# Patient Record
Sex: Female | Born: 1935 | Hispanic: No | Marital: Married | Smoking: Never smoker
Health system: Southern US, Community
[De-identification: ages and names within clinical notes are randomized; demographics above are authoritative.]

## PROBLEM LIST (undated history)

## (undated) DIAGNOSIS — E785 Hyperlipidemia, unspecified: Secondary | ICD-10-CM

## (undated) DIAGNOSIS — I4891 Unspecified atrial fibrillation: Secondary | ICD-10-CM

## (undated) DIAGNOSIS — K922 Gastrointestinal hemorrhage, unspecified: Secondary | ICD-10-CM

## (undated) DIAGNOSIS — I10 Essential (primary) hypertension: Secondary | ICD-10-CM

## (undated) DIAGNOSIS — I2510 Atherosclerotic heart disease of native coronary artery without angina pectoris: Secondary | ICD-10-CM

## (undated) DIAGNOSIS — Z9889 Other specified postprocedural states: Secondary | ICD-10-CM

## (undated) DIAGNOSIS — I509 Heart failure, unspecified: Secondary | ICD-10-CM

## (undated) HISTORY — DX: Heart failure, unspecified: I50.9

## (undated) HISTORY — DX: Atherosclerotic heart disease of native coronary artery without angina pectoris: I25.10

## (undated) HISTORY — DX: Unspecified atrial fibrillation: I48.91

## (undated) HISTORY — PX: CARDIAC CATHETERIZATION: SHX172

## (undated) HISTORY — DX: Essential (primary) hypertension: I10

## (undated) HISTORY — DX: Hyperlipidemia, unspecified: E78.5

## (undated) HISTORY — PX: KNEE ARTHROSCOPY: SUR90

---

## 2002-01-04 ENCOUNTER — Ambulatory Visit (HOSPITAL_COMMUNITY): Admission: RE | Admit: 2002-01-04 | Discharge: 2002-01-04 | Payer: Self-pay | Admitting: Gastroenterology

## 2005-02-15 ENCOUNTER — Ambulatory Visit: Payer: Self-pay | Admitting: Internal Medicine

## 2005-02-22 ENCOUNTER — Ambulatory Visit (HOSPITAL_COMMUNITY): Admission: RE | Admit: 2005-02-22 | Discharge: 2005-02-22 | Payer: Self-pay | Admitting: Cardiology

## 2005-03-04 ENCOUNTER — Ambulatory Visit (HOSPITAL_COMMUNITY): Admission: RE | Admit: 2005-03-04 | Discharge: 2005-03-04 | Payer: Self-pay | Admitting: Gastroenterology

## 2005-03-04 ENCOUNTER — Encounter (INDEPENDENT_AMBULATORY_CARE_PROVIDER_SITE_OTHER): Payer: Self-pay | Admitting: Specialist

## 2007-03-05 ENCOUNTER — Ambulatory Visit: Payer: Self-pay | Admitting: Ophthalmology

## 2007-03-15 ENCOUNTER — Ambulatory Visit: Payer: Self-pay | Admitting: Internal Medicine

## 2009-08-27 ENCOUNTER — Ambulatory Visit: Payer: Self-pay | Admitting: Internal Medicine

## 2009-09-21 ENCOUNTER — Ambulatory Visit: Payer: Self-pay | Admitting: Rheumatology

## 2009-09-29 ENCOUNTER — Ambulatory Visit: Payer: Self-pay | Admitting: Unknown Physician Specialty

## 2009-10-01 ENCOUNTER — Ambulatory Visit: Payer: Self-pay | Admitting: Unknown Physician Specialty

## 2009-10-06 ENCOUNTER — Ambulatory Visit: Payer: Self-pay | Admitting: Cardiology

## 2010-06-23 ENCOUNTER — Encounter: Payer: Self-pay | Admitting: Internal Medicine

## 2010-06-29 ENCOUNTER — Ambulatory Visit: Payer: Self-pay | Admitting: Oncology

## 2010-06-29 ENCOUNTER — Encounter: Payer: Self-pay | Admitting: Internal Medicine

## 2010-06-29 ENCOUNTER — Ambulatory Visit
Admission: RE | Admit: 2010-06-29 | Discharge: 2010-06-29 | Payer: Self-pay | Source: Home / Self Care | Attending: Internal Medicine | Admitting: Internal Medicine

## 2010-06-29 DIAGNOSIS — R079 Chest pain, unspecified: Secondary | ICD-10-CM | POA: Insufficient documentation

## 2010-06-29 LAB — PROTIME-INR
INR: 1.6 — ABNORMAL LOW (ref 2.00–3.50)
Protime: 19.2 Seconds — ABNORMAL HIGH (ref 10.6–13.4)

## 2010-06-30 ENCOUNTER — Telehealth: Payer: Self-pay | Admitting: Internal Medicine

## 2010-06-30 ENCOUNTER — Encounter
Admission: RE | Admit: 2010-06-30 | Discharge: 2010-06-30 | Payer: Self-pay | Source: Home / Self Care | Attending: Internal Medicine | Admitting: Internal Medicine

## 2010-06-30 LAB — CONVERTED CEMR LAB
Basophils Absolute: 0.1 10*3/uL (ref 0.0–0.1)
Basophils Relative: 1 % (ref 0–1)
Chloride: 102 meq/L (ref 96–112)
Lymphocytes Relative: 30 % (ref 12–46)
MCHC: 31.9 g/dL (ref 30.0–36.0)
Monocytes Relative: 9 % (ref 3–12)
Neutro Abs: 3.9 10*3/uL (ref 1.7–7.7)
Neutrophils Relative %: 58 % (ref 43–77)
Potassium: 4.3 meq/L (ref 3.5–5.3)
RBC: 4.23 M/uL (ref 3.87–5.11)
RDW: 15.5 % (ref 11.5–15.5)
Sodium: 136 meq/L (ref 135–145)

## 2010-07-01 ENCOUNTER — Telehealth: Payer: Self-pay | Admitting: Internal Medicine

## 2010-07-02 ENCOUNTER — Ambulatory Visit
Admission: RE | Admit: 2010-07-02 | Discharge: 2010-07-02 | Payer: Self-pay | Source: Home / Self Care | Attending: Internal Medicine | Admitting: Internal Medicine

## 2010-07-05 LAB — GLUCOSE, POCT (MANUAL RESULT ENTRY)
Glucose, Bld: 110 mg/dL — ABNORMAL HIGH (ref 70–99)
Operator id: 178271

## 2010-07-05 LAB — POCT I-STAT 3, VENOUS BLOOD GAS (G3P V)
Acid-Base Excess: 1 mmol/L (ref 0.0–2.0)
Acid-base deficit: 4 mmol/L — ABNORMAL HIGH (ref 0.0–2.0)
Bicarbonate: 22.8 mEq/L (ref 20.0–24.0)
Bicarbonate: 27 mEq/L — ABNORMAL HIGH (ref 20.0–24.0)
O2 Saturation: 57 %
O2 Saturation: 59 %
TCO2: 24 mmol/L (ref 0–100)
TCO2: 28 mmol/L (ref 0–100)
pCO2, Ven: 45.8 mmHg (ref 45.0–50.0)
pCO2, Ven: 47.1 mmHg (ref 45.0–50.0)
pH, Ven: 7.306 — ABNORMAL HIGH (ref 7.250–7.300)
pH, Ven: 7.367 — ABNORMAL HIGH (ref 7.250–7.300)
pO2, Ven: 32 mmHg (ref 30.0–45.0)
pO2, Ven: 33 mmHg (ref 30.0–45.0)

## 2010-07-05 LAB — POCT I-STAT 3, ART BLOOD GAS (G3+)
Acid-base deficit: 1 mmol/L (ref 0.0–2.0)
Bicarbonate: 23.7 mEq/L (ref 20.0–24.0)
O2 Saturation: 92 %
TCO2: 25 mmol/L (ref 0–100)
pCO2 arterial: 37.5 mmHg (ref 35.0–45.0)
pH, Arterial: 7.409 — ABNORMAL HIGH (ref 7.350–7.400)
pO2, Arterial: 63 mmHg — ABNORMAL LOW (ref 80.0–100.0)

## 2010-07-06 ENCOUNTER — Ambulatory Visit: Admission: RE | Admit: 2010-07-06 | Discharge: 2010-07-06 | Payer: Self-pay | Source: Home / Self Care

## 2010-07-06 ENCOUNTER — Ambulatory Visit (HOSPITAL_COMMUNITY)
Admission: RE | Admit: 2010-07-06 | Discharge: 2010-07-06 | Payer: Self-pay | Source: Home / Self Care | Attending: Internal Medicine | Admitting: Internal Medicine

## 2010-07-06 ENCOUNTER — Encounter: Payer: Self-pay | Admitting: Internal Medicine

## 2010-07-07 LAB — PROTIME-INR
INR: 1.2 — ABNORMAL LOW (ref 2.00–3.50)
Protime: 14.4 Seconds — ABNORMAL HIGH (ref 10.6–13.4)

## 2010-07-09 ENCOUNTER — Telehealth: Payer: Self-pay | Admitting: Internal Medicine

## 2010-07-11 NOTE — Procedures (Addendum)
  NAMEESTEPHANI, POPPER                 ACCOUNT NO.:  [id number]  MEDICAL RECORD NO.:  [id number]          PATIENT TYPE:  OIB  LOCATION:  1962                         FACILITY:  [facility]  PHYSICIAN:  Bevelyn Buckles. Emmert Roethler, MDDATE OF BIRTH:  Jun 21, 1931  DATE OF PROCEDURE: DATE OF DISCHARGE:  07/02/2010                           CARDIAC CATHETERIZATION   THE PATIENT IDENTIFICATION:  April Park is a 77 year old woman who is the mother of April Park in our Oncology Department.  She is a very pleasant 76 year old woman with a history of diabetes and hypertension. She has had episodes of chest pain previously and had a normal cardiac catheterization.  She has recently been having progressive chest pain and brought for right and left heart catheterization.  PROCEDURES PERFORMED: 1. Selective coronary angiography. 2. Left heart cath. 3. Left ventriculogram. 4. Right heart cath.  DESCRIPTION OF THE PROCEDURE:  Risks and indications were explained, consent was signed, placed on the chart.  A 4-French arterial sheath was placed in the right femoral artery using modified Seldinger technique. Standard catheters, including a JL-4, 3DRC, and angled pigtail, were used for the left heart cath, a 7-French venous sheath was displaced in the right femoral vein, and standard Swan-Ganz catheter was used for the right heart cath.  There were no apparent complications.  Right atrial pressure 9, RV pressure 43/7 with an EDP of 10, PA pressure 40/18 with a mean of 30, pulmonary capillary wedge pressure mean of 22 with V-wave of 30, central aortic pressure 117/57 with a mean of 82, LV pressure 116/14 with an EDP of 17.  There was no aortic stenosis.  Fick cardiac output was 4.1 with a cardiac index of 2.3.  Left main was normal.  LAD was a moderate-sized vessel, coursing to the apex.  It gave off two diagonals.  It had a 40-50% tubular lesion in the midsection.  Overall, the vessel was fairly small  consistent with diabetic vasculopathy.  Left circumflex was made up primarily of an OM-1.  There were minor luminal irregularities in the proximal portion.  Right coronary artery was a large dominant vessel.  It gave off a PDA and two large posterolaterals.  There was just minor luminal plaquing throughout the proximal and midportion.  Left ventriculogram was done in the RAO position shows an EF of approximately 25% with severe global hypokinesis.  ASSESSMENT: 1. Mild nonobstructive coronary artery disease. 2. Severe left ventricular dysfunction with an ejection fraction     estimated at approximately 25%. 3. Significant V-waves on the wedge tracing suggestive of significant     mitral regurgitation, although I did not see significant mitral     regurgitation on the left ventriculogram.  Plan will be to proceed with medical therapy, and we will check a 2-D echocardiogram to further understand her LV dysfunction and valvular disease.     Bevelyn Buckles. Jahon Bart, MD     DRB/MEDQ  D:  07/02/2010  T:  07/03/2010  Job:  [id number]  Electronically Signed by Arvilla Meres MD on 07/11/2010 07:13:10 PM

## 2010-07-16 ENCOUNTER — Ambulatory Visit: Admission: RE | Admit: 2010-07-16 | Discharge: 2010-07-16 | Payer: Self-pay | Source: Home / Self Care

## 2010-07-16 LAB — CONVERTED CEMR LAB: POC INR: 2.1

## 2010-07-22 NOTE — Progress Notes (Signed)
Summary: cx cath, sch CT  Phone Note Other Incoming   Summary of Call: recieved mess from Dr Gala Romney last pm that pt called him and didn't want to proceed w/cath, called cath lab at 7am and cx'd cath for today, per Dr Gala Romney will order CTA to r/o PE, have called [facility], they state pt can just walk in, order and labs faxed to [phone], pts daughter Dr Welton Flakes [phone] is aware  Initial call taken by: Meredith Staggers, RN,  June 30, 2010 9:13 AM

## 2010-07-22 NOTE — Progress Notes (Signed)
Summary: sch appts.  Phone Note Outgoing Call   Call placed by: Meredith Staggers, RN,  July 09, 2010 3:48 PM Summary of Call: per Dr Gala Romney pt needs to est. w/our coumadin clinic and needs a f/u appt w/him in 2 weeks, appt w/coumadin clinic sch. for Fri 1/27 appt w/Dr Jayd Cadieux 2/3 at 10:30 pts daughter aware

## 2010-07-22 NOTE — Letter (Signed)
Summary: Cardiac Catheterization Instructions- [facility]  [employer], Main Office  [address]   [place], [place] [address]   Phone: [phone]  Fax: [phone]     06/29/2010 MRN: [id number]  [facility] [address] [place], [place]  [address]  Dear Ms. Lapierre,   You are scheduled for a Cardiac Catheterization on Wed. 06/30/10 with  Dr. Gala Romney  Please arrive to the 1st floor of the Heart and Vascular Center at [facility] at 7:30 am / pm on the day of your procedure. Please do not arrive before 6:30 a.m. Call the Heart and Vascular Center at [phone] if you are unable to make your appointmnet. The Code to get into the parking garage under the building is 0030. Take the elevators to the 1st floor. You must have someone to drive you home. Someone must be with you for the first 24 hours after you arrive home. Please wear clothes that are easy to get on and off and wear slip-on shoes. Do not eat or drink after midnight except water with your medications that morning. Bring all your medications and current insurance cards with you.  _X_ DO NOT take these medications before your procedure:   Do Not Take Januvia, Glimepiride, and Metformin AM of test    Do Not Take Coumadin today        Do Not take Metformin for 2 days after cath   ___ Make sure you take your aspirin.  ___ You may take ALL of your medications with water that morning. ________________________________________________________________________________________________________________________________  ___ DO NOT take ANY medications before your procedure.  ___ Pre-med instructions:  ________________________________________________________________________________________________________________________________  The usual length of stay after your procedure is 2 to 3 hours. This can vary.  If you have any questions, please call the office at the number listed above.   Meredith Staggers,  RN

## 2010-07-22 NOTE — Progress Notes (Signed)
Summary: sch cath  Phone Note Outgoing Call   Call placed by: Meredith Staggers, RN,  July 01, 2010 10:02 AM Summary of Call: per Dr Gala Romney pt decided she wanted to have cath, cath sch for tom 1/13 at 7:30 pts daugher Dr Welton Flakes is aware to arrive at 6:30 and instructions reviewed

## 2010-07-22 NOTE — Assessment & Plan Note (Signed)
Summary: chest pain   Visit Type:  Initial Consult Primary Provider:  Dr Yves Dill  CC:  chest pain (ntg x3 with some relief).  History of Present Illness: 77 y/o woman (mother of Dr. Drue Second) with h/o HTN, DM2 x 15 years, hyperlipidemia.   Has had 2 cardiac caths in past. One was 15-20 years ago at [facility] for CP. Coronaries were normal. Repeat cath about 5 years ago by Dr. Sharyn Lull was ok. About 2 years ago had an echo for dyspnea and LE edema. EF was < 50%. Treated medically with recovery of EF by report.   Several weeks ago was in [place] and became weak and SOB. Started on HCTZ. Found to be in new onset AF. Came back and went to [facility] this past week and saw Dr. Deno Lunger. Who started her and her on coumadin with plans for a possibe ablation down the road.   Over past month has had 3 episodes of CP. Can happen at any time. No rdlation to exertion. Last night woke suddenly with pinching CP which progressed. lasted about an hour and then resolved. Denies edema. Mild dyspnea.   Anticoagulation Management History:      Positive risk factors for bleeding include an age of 26 years or older.  The bleeding index is 'intermediate risk'.  Positive CHADS2 values include Age > 78 years old.     Preventive Screening-Counseling & Management  Alcohol-Tobacco     Smoking Status: never  Caffeine-Diet-Exercise     Does Patient Exercise: no      Drug Use:  no.    Current Medications (verified): 1)  Warfarin Sodium 5 Mg Tabs (Warfarin Sodium) .... Use As Directed By Anticoagulation Clinic 2)  Exforge 10-320 Mg Tabs (Amlodipine Besylate-Valsartan) .... Take 1 Tablet By Mouth Once A Day 3)  Januvia 100 Mg Tabs (Sitagliptin Phosphate) .... Take 1 Tablet By Mouth Once A Day 4)  Glimepiride 2 Mg Tabs (Glimepiride) .... Take 1 Tablet By Mouth Two Times A Day 5)  Metformin Hcl 500 Mg Tabs (Metformin Hcl) .... Two Times A Day 6)  Crestor 20 Mg Tabs (Rosuvastatin Calcium) .... Take One  Tablet By Mouth Daily. 7)  Hydrochlorothiazide 25 Mg Tabs (Hydrochlorothiazide) .... Take One Tablet By Mouth Daily. 8)  Aspirin 81 Mg Tbec (Aspirin) .... Take One Tablet By Mouth Daily 9)  Multivitamins   Tabs (Multiple Vitamin) .... Once Daily 10)  Calcium Carbonate-Vitamin D 600-400 Mg-Unit  Tabs (Calcium Carbonate-Vitamin D) .... Once Daily  Allergies (verified): No Known Drug Allergies  Past History:  Family History: Last updated: 06/29/2010 Mother - Atrial Fib / dystolic dysfunction Family History of Cancer:  Pancratic Family History of Diabetes:  Family History of Hypertension:   Social History: Last updated: 06/29/2010 Retired  Married  Tobacco Use - No.  Alcohol Use - no Regular Exercise - no Drug Use - no  Risk Factors: Exercise: no (06/29/2010)  Risk Factors: Smoking Status: never (06/29/2010)  Past Medical History: 1. Chest Pain 2. Low EF 3. Osteoporosis 4. Arthritis 5. Atrial Fibrillation 6. Diabetes Type 2  Past Surgical History: Knee Arthroscopy -- 2009 Cardiac Catheterization   Family History: Reviewed history and no changes required. Mother - Atrial Fib / dystolic dysfunction Family History of Cancer:  Pancratic Family History of Diabetes:  Family History of Hypertension:   Social History: Reviewed history and no changes required. Retired  Married  Tobacco Use - No.  Alcohol Use - no Regular Exercise - no Drug Use -  no Smoking Status:  never Does Patient Exercise:  no Drug Use:  no  Review of Systems       As per HPI and past medical history; otherwise all systems negative.   Vital Signs:  Patient profile:   77 year old female Height:      62 inches Weight:      156 pounds BMI:     28.64 Pulse rate:   78 / minute BP sitting:   104 / 62  (left arm) Cuff size:   regular  Vitals Entered By: Hardin Negus, RMA (June 29, 2010 4:17 PM)  Physical Exam  General:  well appearing. no resp difficulty HEENT: normal Neck:  supple. no JVP 8-9. Marland Kitchen Carotids 2+ bilat; no bruits. No lymphadenopathy or thryomegaly appreciated. Cor: PMI nondisplaced. Irregular rate & rhythm. No rubs, gallops. 2/6 sys murmur. LSB Lungs: clear Abdomen: soft, nontender, nondistended. No hepatosplenomegaly. No bruits or masses. Good bowel sounds. Extremities: no cyanosis, clubbing, rash, edema Neuro: alert & orientedx3, cranial nerves grossly intact. moves all 4 extremities w/o difficulty. affect pleasant    Impression & Recommendations:  Problem # 1:  CHEST PAIN (ICD-786.50) Has atypical and typical features. With recent prolonged travel and elevated neck veins I do wonder whether she may have had a PE. However, she is also at risk for CAD given her multiple CRFs. Long talk with her and her family about options. Will plan CT angio to exclude PE. If negative consider R and L heart cath. Will change HCTZ to lasix to help with volume overload.   Problem # 2:  ATRIAL FIBRILLATION Suspect loss of atrial kick may be contributing to volume overload. She is being follwoed by [facility] with reported plans for possible ablation. As this is her first episode I would favor DC-CV in 4-6 weeks of therapeutic anti-coagulation and then watchful waiting to see if it reutnrs.  Other Orders: T-Basic Metabolic Panel [phone]) T-CBC w/Diff [id number]) T-Protime, Auto [phone])  Anticoagulation Management Assessment/Plan:      The patient's current anticoagulation dose is Warfarin sodium 5 mg tabs: Use as directed by Anticoagulation Clinic.

## 2010-07-22 NOTE — Medication Information (Signed)
Summary: new pt/tp  Anticoagulant Therapy  Managed by: Geoffry Paradise, PharmD Referring MD: Gala Romney PCP: Dr Yves Dill Supervising MD: Tenny Craw MD, Gunnar Fusi Indication 1: Atrial Fibrillation [place] Site: [address] INR POC 2.1 INR RANGE 2-3  Dietary changes: no    Health status changes: no    Bleeding/hemorrhagic complications: no    Recent/future hospitalizations: no    Any changes in medication regimen? no    Recent/future dental: no  Any missed doses?: no       Is patient compliant with meds? yes      Comments: Patient started Coumadin on 01/15 at 5 mg daily.  INR checked on 1/18 = 1.2 at [facility]  Planning ablation.    Allergies: No Known Drug Allergies  Anticoagulation Management History:      The patient comes in today for her initial visit for anticoagulation therapy.  Positive risk factors for bleeding include an age of 75 years or older.  The bleeding index is 'intermediate risk'.  Positive CHADS2 values include Age > 75 years old.  Anticoagulation responsible provider: Tenny Craw MD, Gunnar Fusi.  INR POC: 2.1.  Cuvette Lot#: [id number].  Exp: 06/2011.    Anticoagulation Management Assessment/Plan:      The patient's current anticoagulation dose is Warfarin sodium 5 mg tabs: Use as directed by Anticoagulation Clinic.  The target INR is 2.0-3.0.  The next INR is due 07/23/2010.  Results were reviewed/authorized by Geoffry Paradise, PharmD.         Current Anticoagulation Instructions: INR:  2.1 (goal 2-3)  Your INR is at goal today.  Please continue taking 5 mg daily.  Return to office in 1 week for another INR check.

## 2010-07-23 ENCOUNTER — Encounter: Payer: Self-pay | Admitting: Cardiology

## 2010-07-23 ENCOUNTER — Ambulatory Visit (INDEPENDENT_AMBULATORY_CARE_PROVIDER_SITE_OTHER): Payer: Medicare Other | Admitting: Internal Medicine

## 2010-07-23 ENCOUNTER — Encounter (INDEPENDENT_AMBULATORY_CARE_PROVIDER_SITE_OTHER): Payer: Medicare Other

## 2010-07-23 ENCOUNTER — Encounter: Payer: Self-pay | Admitting: Internal Medicine

## 2010-07-23 DIAGNOSIS — I5022 Chronic systolic (congestive) heart failure: Secondary | ICD-10-CM | POA: Insufficient documentation

## 2010-07-23 DIAGNOSIS — R0989 Other specified symptoms and signs involving the circulatory and respiratory systems: Secondary | ICD-10-CM

## 2010-07-23 DIAGNOSIS — I059 Rheumatic mitral valve disease, unspecified: Secondary | ICD-10-CM

## 2010-07-23 DIAGNOSIS — I4891 Unspecified atrial fibrillation: Secondary | ICD-10-CM

## 2010-07-23 DIAGNOSIS — Z7901 Long term (current) use of anticoagulants: Secondary | ICD-10-CM

## 2010-07-27 ENCOUNTER — Encounter (INDEPENDENT_AMBULATORY_CARE_PROVIDER_SITE_OTHER): Payer: Self-pay | Admitting: *Deleted

## 2010-07-28 NOTE — Assessment & Plan Note (Signed)
Summary: rov/hms   Vital Signs:  Patient profile:   77 year old female Height:      62 inches Weight:      155 pounds BMI:     28.45 Pulse rate:   95 / minute BP sitting:   130 / 70  (left arm) Cuff size:   regular  Vitals Entered By: Hardin Negus, RMA (July 23, 2010 10:49 AM)  Visit Type:  Follow-up Primary Provider:  Dr Yves Dill  CC:  2 CP episodes since cath (same feeling).  History of Present Illness: 77 y/o woman (mother of Dr. Drue Second) with h/o HTN, DM2 x 15 years, hyperlipidemia, AF presents for further management of CHF due to NICM  According to previous notes had echo in 3/11 with EF >55%. no mention of MR. Echo 12/11 EF 40-45% mild LVH.   Underwent cath by me in January 2012. Non-obstructive CAD with LAD 40-50% mild.  EF 20-25% with severe global HK.  Right atrial pressure 9, RV pressure 43/7 with an EDP of 10, PA pressure 40/18 with a mean of 30, pulmonary capillary wedge pressure mean of 22 with V-wave of 30, central aortic pressure 117/57 with a mean of 82, LV pressure 116/14 with an EDP of 17.  There was no aortic stenosis. Fick cardiac output was 4.1 with a cardiac index of 2.3.   F/u echo which I reviewed today with her daughter shows EF 20-25% with mild LV dilation (LVIDd 5.6 cm). Signifcant biatrial enlargement with mod TR and moderate to severe central MR.   I also spoke to Dr. Deno Lunger at [facility]. Apparently 77 has been in and out of AF in past and was placed on amiodarone in [place] but was unable to tolerate due to GI side effects. Had DC-CV in past but reverted to NSR quickly. Was being considered for AF ablation priod to knowledge of signifcant LV dysfunction. They have also been in touch with Dr. Gasper Lloyd at [facility] regarding MR and he mentioned possible MV clip.   Since cath had 2 episodes of CP.  Gets SOB with just mild activity about 20 yards. No orthopnea or PND. Minimal edema. No palpitations. Compliant with meds. No bleeding with  coumadin.   Current Medications (verified): 1)  Warfarin Sodium 5 Mg Tabs (Warfarin Sodium) .... Use As Directed By Anticoagulation Clinic 2)  Diovan 320 Mg Tabs (Valsartan) .... Take One Tablet By Mouth Daily 3)  Glimepiride 2 Mg Tabs (Glimepiride) .... Take 1 Tablet By Mouth Two Times A Day 4)  Metformin Hcl 500 Mg Tabs (Metformin Hcl) .Marland Kitchen.. 1 Tab in The Am and 2 in The Pm 5)  Crestor 20 Mg Tabs (Rosuvastatin Calcium) .... Take One Tablet By Mouth Daily. 6)  Furosemide 20 Mg Tabs (Furosemide) .... Take One Tablet By Mouth Daily. 7)  Aspirin 81 Mg Tbec (Aspirin) .... Take One Tablet By Mouth Daily 8)  Multivitamins   Tabs (Multiple Vitamin) .... Once Daily 9)  Calcium Carbonate-Vitamin D 600-400 Mg-Unit  Tabs (Calcium Carbonate-Vitamin D) .... Once Daily 10)  Potassium Chloride Crys Cr 20 Meq Cr-Tabs (Potassium Chloride Crys Cr) .... Take One Tablet By Mouth Daily 11)  Dexilant 60 Mg Cpdr (Dexlansoprazole) .... Once Daily  Allergies (verified): No Known Drug Allergies  Past History:  Past Medical History: 1. Chest Pain     --non-obstructive CAD on cath January 2012. mLAD 40-50% 2. CHF     --due to recurrent NICM     --echo 06/2010: EF  20-25% mod-severe MR, moderate TR 3. Atrial fib     --failed amio due to Gi side effects (in [place])  4. DM2 5. HTN 6. HL  Review of Systems       As per HPI and past medical history; otherwise all systems negative.   Physical Exam  General:  well appearing. no resp difficulty HEENT: normal Neck: supple. no JVP 10 +prominent CV waves. Carotids 2+ bilat; no bruits. No lymphadenopathy or thryomegaly appreciated. Cor: PMI nondisplaced. Irregular rate & rhythm. No rubs, gallops. 2/6 sys murmur. LSB and apex Lungs: clear Abdomen: soft, nontender, nondistended. No hepatosplenomegaly. No bruits or masses. Good bowel sounds. Extremities: no cyanosis, clubbing, rash. trace edema Neuro: alert & orientedx3, cranial nerves grossly intact. moves all 4  extremities w/o difficulty. 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 Currently NYHA III with mild to moderate volume overload. Will add spironolactone 225 and increase carvedilol to 6.25 two times a day. Will check cardiac MRI  as well as serologies including HIV, HEP C, ANA, TSH, iron, SPEP/UPEP. I don't feel there is role for endomyocardial bx at this time unless MRI shows infiltrative process. Check BMET next week.   Problem # 2:  CHEST PAIN (ICD-786.50) Coronaries OK. Possibly due to increased wall stress/LVEDP.  Problem # 3:  ATRIAL FIBRILLATION Rate controlled. Apparently failed amio in past. Given atrial dimensions and reduced EF will be very hard to keep in sinus. Will continue coumadin and rate control strategy for now. Once on stable HF therapy will refer back to Dr. Briscoe Burns for possible AF ablation.  Problem # 4:  MITRAL REGURGITATION This is moderate to severe. I suspect it is secondary process due to annular dilation and not the primary problem here. Have recommeneded aggressive rx of HF to see if this will improve with medical therapy. If persists can then consider referral to Dr. Regino Schultze at [facility] for consideration of MV clip as needed.   Other Orders: TLB-BMP (Basic Metabolic Panel-BMET) (80048-METABOL) TLB-BNP (B-Natriuretic Peptide) (83880-BNPR) Hepatitis C Antibody ([id number]) T-HIV Antibody  (Reflex) [id number]) SPEP- [facility] (29562-13086) TSH [phone]) Iron [id number]) Ferritin-[facility] (02725-36644) T- * Misc. Laboratory test [phone]) Hepatitis C Antibody [id number]) Hepatitis C Antibody ([id number]) Cardiac MRI (Cardiac MRI)  Patient Instructions: 1)  Start Spironolactone 25mg  daily 2)  Start Carvedilol to 6.25mg  two times a day  3)  Labs today 4)  Your physician recommends that you return for lab work in: 1 week (bmet, bnp 428.22) 5)  Your physician  has requested that you have a cardiac MRI.  Cardiac MRI uses a computer to create images of your heart as it's beating, producing both still and moving pictures of your heart and major blood vessels. For further information please visit  [email or web address]  Please follow the instruction sheet given to you today for more information. Prescriptions: CARVEDILOL 6.25 MG TABS (CARVEDILOL) Take one tablet by mouth twice a day  #60 x 6   Entered by:   Meredith Staggers, RN   Authorized by:   Dolores Patty, MD, [facility]   Signed by:   Meredith Staggers, RN on 07/23/2010   Method used:   Electronically to        [facility]* (retail)       [address]       [place], [place]  [address]       Ph: [phone]  Fax: [phone]   RxID:   [id number] SPIRONOLACTONE 25 MG TABS (SPIRONOLACTONE) Take one tablet by mouth daily  #30 x 6   Entered by:   Meredith Staggers, RN   Authorized by:   Dolores Patty, MD, [facility]   Signed by:   Meredith Staggers, RN on 07/23/2010   Method used:   Electronically to        [facility]* (retail)       [address]       [place], [place]  [id number]       Ph: [phone]       Fax: [phone]   RxID:   [id number]    Orders Added: 1)  TLB-BMP (Basic Metabolic Panel-BMET) [80048-METABOL] 2)  TLB-BNP (B-Natriuretic Peptide) [83880-BNPR] 3)  Hepatitis C Antibody [30160-10932] 4)  T-HIV Antibody  (Reflex) [[id number]] 5)  SPEP- [facility] [[id number]] 6)  TSH [[id number]] 7)  Iron [[id number]] 8)  Ferritin-[facility] [[id number]] 9)  T- * Misc. Laboratory test [99999] 10)  Hepatitis C Antibody [[id number]] 11)  Hepatitis C Antibody [[id number]] 12)  Cardiac MRI [Cardiac MRI]

## 2010-07-28 NOTE — Medication Information (Signed)
Summary: rov/kh  Anticoagulant Therapy  Managed by: Tammy Sours, PharmD Referring MD: Gala Romney PCP: Dr Yves Dill Supervising MD: Jens Som MD, Arlys John Indication 1: Atrial Fibrillation [place] Site: [address] INR POC 2.2 INR RANGE 2-3  Dietary changes: no    Health status changes: no    Bleeding/hemorrhagic complications: no    Recent/future hospitalizations: no    Any changes in medication regimen? no    Recent/future dental: no  Any missed doses?: no       Is patient compliant with meds? yes      Comments: Patient states that she may have some procedures coming up.   Allergies: No Known Drug Allergies  Anticoagulation Management History:      The patient is taking warfarin and comes in today for a routine follow up visit.  Positive risk factors for bleeding include an age of 19 years or older.  The bleeding index is 'intermediate risk'.  Positive CHADS2 values include Age > 44 years old.  Anticoagulation responsible provider: Jens Som MD, Arlys John.  INR POC: 2.2.  Cuvette Lot#: [id number].  Exp: 06/2011.    Anticoagulation Management Assessment/Plan:      The patient's current anticoagulation dose is Warfarin sodium 5 mg tabs: Use as directed by Anticoagulation Clinic.  The target INR is 2.0-3.0.  The next INR is due 08/06/2010.  Results were reviewed/authorized by Tammy Sours, PharmD.         Prior Anticoagulation Instructions: INR:  2.1 (goal 2-3)  Your INR is at goal today.  Please continue taking 5 mg daily.  Return to office in 1 week for another INR check.    Current Anticoagulation Instructions: INR 2.2   Continue taking 1 tablet (5mg ) everday. Recheck INR in 2 weeks.

## 2010-07-28 NOTE — Miscellaneous (Signed)
Summary: Orders Update  Clinical Lists Changes  Orders: Added new Test order of T-Basic Metabolic Panel [phone]) - Signed Added new Test order of T-TSH [phone]) - Signed Added new Test order of T-Ferritin [phone]) - Signed Added new Test order of T-Hepatitis C Antibody ([id number]) - Signed Added new Test order of T-Antinuclear Antib (ANA) [phone]) - Signed Added new Test order of T-Iron ([id number]) - Signed Added new Test order of T-HIV Antibody  (Reflex) [id number]) - Signed Added new Test order of T-BNP  (B Natriuretic Peptide) [phone]) - Signed Added new Test order of T-Immunofixation Electrophoresis, Serum  ([id number]) - Signed Added new Test order of T-Immunofixation Interp, Urine ([id number]) - Signed

## 2010-07-29 ENCOUNTER — Other Ambulatory Visit: Payer: Self-pay | Admitting: Internal Medicine

## 2010-07-29 DIAGNOSIS — I5022 Chronic systolic (congestive) heart failure: Secondary | ICD-10-CM

## 2010-07-30 ENCOUNTER — Encounter: Payer: Self-pay | Admitting: Internal Medicine

## 2010-07-30 ENCOUNTER — Other Ambulatory Visit: Payer: Self-pay | Admitting: Internal Medicine

## 2010-07-30 ENCOUNTER — Ambulatory Visit (HOSPITAL_COMMUNITY)
Admission: RE | Admit: 2010-07-30 | Discharge: 2010-07-30 | Disposition: A | Discharge status: Home / Self Care | Payer: Medicare Other | Source: Ambulatory Visit | Attending: Internal Medicine | Admitting: Internal Medicine

## 2010-07-30 ENCOUNTER — Other Ambulatory Visit (INDEPENDENT_AMBULATORY_CARE_PROVIDER_SITE_OTHER): Payer: Medicare Other

## 2010-07-30 DIAGNOSIS — I5022 Chronic systolic (congestive) heart failure: Secondary | ICD-10-CM

## 2010-07-30 DIAGNOSIS — I517 Cardiomegaly: Secondary | ICD-10-CM

## 2010-07-30 DIAGNOSIS — R0602 Shortness of breath: Secondary | ICD-10-CM

## 2010-07-30 LAB — BASIC METABOLIC PANEL
GFR: 63.01 mL/min (ref >60.00)
Potassium: 4.7 mEq/L (ref 3.5–5.1)
Sodium: 131 mEq/L — ABNORMAL LOW (ref 135–145)

## 2010-07-30 LAB — BRAIN NATRIURETIC PEPTIDE: Pro B Natriuretic peptide (BNP): 663.2 pg/mL — ABNORMAL HIGH (ref 0.0–100.0)

## 2010-07-30 MED ORDER — GADOPENTETATE DIMEGLUMINE 469.01 MG/ML IV SOLN
32.0000 mL | Freq: Once | INTRAVENOUS | Status: AC
  Administered 2010-07-30: 32 mL via INTRAVENOUS

## 2010-07-31 ENCOUNTER — Inpatient Hospital Stay (HOSPITAL_COMMUNITY): Admission: RE | Admit: 2010-07-31 | Payer: Medicare Other | Source: Ambulatory Visit

## 2010-08-02 LAB — CONVERTED CEMR LAB
Alpha-2-Globulin: 11.4 % (ref 7.1–11.8)
CO2: 26 meq/L (ref 19–32)
Calcium: 9.7 mg/dL (ref 8.4–10.5)
Chloride: 100 meq/L (ref 96–112)
Ferritin: 27 ng/mL (ref 10–291)
HCV Ab: NEGATIVE
IgA: 400 mg/dL — ABNORMAL HIGH (ref 68–378)
IgG (Immunoglobin G), Serum: 1890 mg/dL — ABNORMAL HIGH (ref 694–1618)
IgM, Serum: 96 mg/dL (ref 60–263)
Potassium: 4.4 meq/L (ref 3.5–5.3)
Pro B Natriuretic peptide (BNP): 355 pg/mL — ABNORMAL HIGH (ref 0.0–100.0)
Sodium: 138 meq/L (ref 135–145)

## 2010-08-05 NOTE — Letter (Signed)
Summary: Appointment - Cardiac MRI  [employer], Main Office  [address]   [place], [place] [address]   Phone: [phone]  Fax: [phone]      July 27, 2010 MRN: [id number]   [facility] [address] [place], [place]  [address]   Dear Ms. [facility],   We have scheduled the above patient for an appointment for a Cardiac MRI on  07-30-2010 at 1:00 p.m.  Please refer to the below information for the location and instructions for this test:  Location:     [facility]       [address]       [place], [place]  [address] Instructions:    Wilmon Arms at [facility] Outpatient Registration 45 minutes prior to your appointment time.  This will ensure you are in the Radiology Department 30 minutes prior to your appointment.    There are no restrictions for this test you may eat and take medications as usual.  If you need to reschedule this appointment please call at the number listed above.  Sincerely,        Lorne Skeens  [facility] Scheduling Team

## 2010-08-06 ENCOUNTER — Encounter (INDEPENDENT_AMBULATORY_CARE_PROVIDER_SITE_OTHER): Payer: Medicare Other

## 2010-08-06 ENCOUNTER — Encounter: Payer: Self-pay | Admitting: Cardiology

## 2010-08-06 DIAGNOSIS — Z7901 Long term (current) use of anticoagulants: Secondary | ICD-10-CM

## 2010-08-06 DIAGNOSIS — I4891 Unspecified atrial fibrillation: Secondary | ICD-10-CM

## 2010-08-09 ENCOUNTER — Ambulatory Visit (INDEPENDENT_AMBULATORY_CARE_PROVIDER_SITE_OTHER): Payer: Medicare Other | Admitting: Internal Medicine

## 2010-08-09 ENCOUNTER — Encounter: Payer: Self-pay | Admitting: Internal Medicine

## 2010-08-09 DIAGNOSIS — I059 Rheumatic mitral valve disease, unspecified: Secondary | ICD-10-CM | POA: Insufficient documentation

## 2010-08-09 DIAGNOSIS — I5022 Chronic systolic (congestive) heart failure: Secondary | ICD-10-CM

## 2010-08-09 DIAGNOSIS — I4891 Unspecified atrial fibrillation: Secondary | ICD-10-CM

## 2010-08-09 DIAGNOSIS — I4819 Other persistent atrial fibrillation: Secondary | ICD-10-CM | POA: Insufficient documentation

## 2010-08-11 NOTE — Letter (Signed)
Summary: [facility] New Patient   [facility] Electrophysiology New Patient   Imported By: Roderic Ovens 08/06/2010 15:14:44  _____________________________________________________________________  External Attachment:    Type:   Image     Comment:   External Document

## 2010-08-11 NOTE — Medication Information (Signed)
Summary: rov/sp  Anticoagulant Therapy  Managed by: Bethena Midget, RN, BSN Referring MD: Gala Romney PCP: Dr Yves Dill Supervising MD: Daleen Squibb MD, Maisie Fus Indication 1: Atrial Fibrillation Lab Used: [facility] [place] Site: [address] INR POC 1.9 INR RANGE 2-3  Dietary changes: no    Health status changes: no    Bleeding/hemorrhagic complications: no    Recent/future hospitalizations: no    Any changes in medication regimen? no    Recent/future dental: no  Any missed doses?: no       Is patient compliant with meds? yes      Comments: No pending Ablation  Allergies: No Known Drug Allergies  Anticoagulation Management History:      The patient is taking warfarin and comes in today for a routine follow up visit.  Positive risk factors for bleeding include an age of 75 years or older.  The bleeding index is 'intermediate risk'.  Positive CHADS2 values include History of CHF and Age > 45 years old.  Anticoagulation responsible provider: Daleen Squibb MD, Maisie Fus.  INR POC: 1.9.  Cuvette Lot#: [id number].  Exp: 06/2011.    Anticoagulation Management Assessment/Plan:      The patient's current anticoagulation dose is Warfarin sodium 5 mg tabs: Use as directed by Anticoagulation Clinic.  The target INR is 2.0-3.0.  The next INR is due 08/13/2010.  Anticoagulation instructions were given to patient/Daughter.  Results were reviewed/authorized by Bethena Midget, RN, BSN.  She was notified by Bethena Midget, RN, BSN.         Prior Anticoagulation Instructions: INR 2.2   Continue taking 1 tablet (5mg ) everday. Recheck INR in 2 weeks.   Current Anticoagulation Instructions: INR 1.9 Today 1.5 pills then change dose to 1 pill everyday except 1.5 pills on Mondays and Fridays. Recheck in one week.

## 2010-08-13 ENCOUNTER — Encounter: Payer: Self-pay | Admitting: Internal Medicine

## 2010-08-13 ENCOUNTER — Encounter (INDEPENDENT_AMBULATORY_CARE_PROVIDER_SITE_OTHER): Payer: Medicare Other

## 2010-08-13 DIAGNOSIS — Z7901 Long term (current) use of anticoagulants: Secondary | ICD-10-CM

## 2010-08-13 DIAGNOSIS — I4891 Unspecified atrial fibrillation: Secondary | ICD-10-CM

## 2010-08-17 NOTE — Medication Information (Signed)
Summary: rov/pc  Anticoagulant Therapy  Managed by: Cloyde Reams, RN, BSN Referring MD: Gala Romney PCP: Dr Yves Dill Supervising MD: Tenny Craw MD, Gunnar Fusi Indication 1: Atrial Fibrillation Lab Used: [facility] [place] Site: [address] INR POC 4.0 INR RANGE 2-3  Dietary changes: no    Health status changes: no    Bleeding/hemorrhagic complications: no    Recent/future hospitalizations: no    Any changes in medication regimen? yes       Details: Digoxin started  Recent/future dental: no  Any missed doses?: no       Is patient compliant with meds? yes       Allergies: No Known Drug Allergies  Anticoagulation Management History:      The patient is taking warfarin and comes in today for a routine follow up visit.  Positive risk factors for bleeding include an age of 75 years or older.  The bleeding index is 'intermediate risk'.  Positive CHADS2 values include History of CHF and Age > 75 years old.  Anticoagulation responsible provider: Tenny Craw MD, Gunnar Fusi.  INR POC: 4.0.  Cuvette Lot#: [id number].  Exp: 07/2011.    Anticoagulation Management Assessment/Plan:      The patient's current anticoagulation dose is Warfarin sodium 5 mg tabs: Use as directed by Anticoagulation Clinic.  The target INR is 2.0-3.0.  The next INR is due 08/20/2010.  Anticoagulation instructions were given to patient/Daughter.  Results were reviewed/authorized by Cloyde Reams, RN, BSN.         Prior Anticoagulation Instructions: INR 1.9 Today 1.5 pills then change dose to 1 pill everyday except 1.5 pills on Mondays and Fridays. Recheck in one week.   Current Anticoagulation Instructions: INR 4.0  Skip tomorrow's dosage, start taking 1 tablet daily except 1.5 tablets on Fridays.  Recheck in 1 week.

## 2010-08-17 NOTE — Assessment & Plan Note (Signed)
Summary: 2 wks/ok per [email or web address]   Visit Type:  Follow-up Primary Provider:  Dr Yves Dill  CC:  Chest pains 3 days.  History of Present Illness: 77 y/o woman (mother of Dr. Drue Second) with h/o HTN, DM2 x 15 years, hyperlipidemia, AF presents for further management of CHF due to NICM  According to previous notes had echo in 3/11 with EF >55%. no mention of MR. Echo 12/11 EF 40-45% mild LVH.   Underwent cath January 2012. Non-obstructive CAD with LAD 40-50% mild.  EF 20-25% with severe global HK.  RA 9, RV 43/7/10, PA 40/18 (30), PCWP mean 22 with V-wave of 30, central aortic pressure 117/57 with a mean of 82, LV pressure 116/14/17.  There was no aortic stenosis. Fick cardiac output was 4.1 with a cardiac index of 2.3.   Cardiac MRI (which I reviewed with her and her family) LV markedly dilated EF 24% Significant RV dysfunction. No hyperenhancement. No comment on MR.  I also spoke to Dr. Deno Lunger at [facility]. Apparently Ms. Park Breed has been in and out of AF in past and was placed on amiodarone in [place] but was unable to tolerate due to GI side effects (?dose). Had DC-CV in past but reverted to AF quickly. Was being considered for AF ablation priod to knowledge of signifcant LV dysfunction.   Since last visit she says she feels a little better. Breathing improved. Can walk around store slowly without stopping. Gets dyspneic  if walks faster or has to go up an incline. 2 epsiodes of mild nocturnal CP. No orthopnea, PND or edema. Family feels that she is more fatigued.   Recent labs 2/17  Na 135 K 4.5 BUN 34 Cr 1.0   Current Medications (verified): 1)  Warfarin Sodium 5 Mg Tabs (Warfarin Sodium) .... Use As Directed By Anticoagulation Clinic 2)  Diovan 320 Mg Tabs (Valsartan) .... Take One Tablet By Mouth Daily 3)  Glimepiride 2 Mg Tabs (Glimepiride) .... Take 1 Tablet By Mouth Two Times A Day 4)  Metformin Hcl 500 Mg Tabs (Metformin Hcl) .Marland Kitchen.. 1 Tab in The Am and 2 in The Pm 5)   Crestor 20 Mg Tabs (Rosuvastatin Calcium) .... Take One Tablet By Mouth Daily. 6)  Furosemide 20 Mg Tabs (Furosemide) .... Take One Tablet By Mouth Daily. 7)  Aspirin 81 Mg Tbec (Aspirin) .... Take One Tablet By Mouth Daily 8)  Multivitamins   Tabs (Multiple Vitamin) .... Once Daily 9)  Calcium Carbonate-Vitamin D 600-400 Mg-Unit  Tabs (Calcium Carbonate-Vitamin D) .... Once Daily 10)  Dexilant 60 Mg Cpdr (Dexlansoprazole) .... Once Daily 11)  Spironolactone 25 Mg Tabs (Spironolactone) .... Take One Tablet By Mouth Daily 12)  Carvedilol 6.25 Mg Tabs (Carvedilol) .... Take One Tablet By Mouth Twice A Day 13)  Meloxicam 7.5 Mg Tabs (Meloxicam) .... Take 1 Tablet By Mouth Two Times A Day 14)  Nexium 40 Mg Cpdr (Esomeprazole Magnesium) .... As Needed 15)  Angised Tabllet .... As Needed For Chest Pains  Allergies (verified): No Known Drug Allergies  Past History:  Past Medical History: 1. Chest Pain     --non-obstructive CAD on cath January 2012. mLAD 40-50% 2. CHF     --due to recurrent NICM     --echo 06/2010: EF 20-25% mod-severe MR, moderate TR     --MRI LVEF 24%. LV severely dilated. +RV dysfunciton. No hyperenhancement 3. Atrial fib     --failed amio due to Gi side effects (in [place])  4. DM2 5. HTN 6.  HL  Review of Systems       As per HPI and past medical history; otherwise all systems negative.   Vital Signs:  Patient profile:   77 year old female Height:      62 inches Weight:      157.50 pounds BMI:     28.91 Pulse rate:   79 / minute Pulse rhythm:   irregular Resp:     18 per minute BP sitting:   108 / 60  (left arm) Cuff size:   large  Vitals Entered By: Vikki Ports (August 09, 2010 4:16 PM)  Physical Exam  General:  well appearing. no resp difficulty HEENT: normal Neck: supple. no JVP 5-6  Carotids 2+ bilat; no bruits. No lymphadenopathy or thryomegaly appreciated. Cor: PMI nondisplaced. Irregular rate & rhythm. No rubs, gallops. 2/6 sys murmur at  apex Lungs: clear Abdomen: soft, nontender, nondistended. No hepatosplenomegaly. No bruits or masses. Good bowel sounds. Extremities: no cyanosis, clubbing, rash. trace edema Neuro: alert & orientedx3, cranial nerves grossly intact. moves all 4 extremities w/o difficulty. affect pleasant    Impression & Recommendations:  Problem # 1:  CHRONIC SYSTOLIC HEART FAILURE (ICD-428.22) Conitnues with NYHA III symptoms. Volume status much improved. Unfortunately no signifcant improvement yet in LV function after 1 month of therapy. Will titrate carvedilol to 9.375 two times a day. Add digoxin 0.125 once daily. Did discuss possible CPX but unlikely to change management at this point. We will see back every 2 weeks for medication titration. Repeat echo in early april. If EF <= 35% will need to consider ICD.  Problem # 2:  ATRIAL FIBRILLATION (ICD-427.31) Rate controlled. Discussed with patient and family that given adequate rate control I doubt that this is contributing to her cardiomyopathy but may be contributing to symptoms. However, she has failed amio in past and has not been able to maintain SR without AA therapy so options limited. Thus, will continue with rate control for now and can f/u with Dr. Deno Lunger at [facility] for re-condsideration of ablation (versus another attempt with amio - at low dose) after her HF regimen has been optimized. Continue coumadin.  Problem # 3:  MITRAL VALVE DISORDERS (ICD-424.0) As MV appears structurally normal on TTE, I suspect this is due to annula rdilation and not primary MV disease. Have asked Dr. Shirlee Latch to review MRI to get his opinion on MV and degree of MR. Do not think MV repair indicated at this time. If MR persists after treatemtn of HF can cosnider referral for possible MV clip with Dr. Regino Schultze. Family interested in considering TEE for further evalaution of MR. We diswcussed pros/cons and they will decided.   Total time with visit, reviewing studies, discussing with  consultants and dictation > 1hr.   Other Orders: Echocardiogram (Echo)  Patient Instructions: 1)  Increase Carvedilol to 9.375mg  two times a day  2)  Start Digoxin 0.125mg  daily 3)  Your physician has requested that you have an echocardiogram.  Echocardiography is a painless test that uses sound waves to create images of your heart. It provides your doctor with information about the size and shape of your heart and how well your heart's chambers and valves are working.  This procedure takes approximately one hour. There are no restrictions for this procedure.  NEEDS 1ST WEEK OF APRIL. 4)  Follow up in 2 weeks Prescriptions: DIGOXIN 0.125 MG TABS (DIGOXIN) Take one tablet by mouth daily  #30 x 6   Entered by:   Herbert Seta  Schub, RN   Authorized by:   Dolores Patty, MD, [facility]   Signed by:   Meredith Staggers, RN on 08/09/2010   Method used:   Electronically to        [facility]* (retail)       [address]       [place], [place]  [id number]       Ph: [phone]       Fax: [phone]   RxID:   [id number]

## 2010-08-20 ENCOUNTER — Encounter (INDEPENDENT_AMBULATORY_CARE_PROVIDER_SITE_OTHER): Payer: Medicare Other

## 2010-08-20 ENCOUNTER — Encounter: Payer: Self-pay | Admitting: Cardiovascular Disease

## 2010-08-20 DIAGNOSIS — I4891 Unspecified atrial fibrillation: Secondary | ICD-10-CM

## 2010-08-20 DIAGNOSIS — Z7901 Long term (current) use of anticoagulants: Secondary | ICD-10-CM

## 2010-08-23 ENCOUNTER — Ambulatory Visit (INDEPENDENT_AMBULATORY_CARE_PROVIDER_SITE_OTHER): Payer: Medicare Other | Admitting: Internal Medicine

## 2010-08-23 ENCOUNTER — Encounter: Payer: Self-pay | Admitting: Internal Medicine

## 2010-08-23 ENCOUNTER — Other Ambulatory Visit: Payer: Self-pay | Admitting: Internal Medicine

## 2010-08-23 DIAGNOSIS — I5022 Chronic systolic (congestive) heart failure: Secondary | ICD-10-CM

## 2010-08-23 DIAGNOSIS — I4891 Unspecified atrial fibrillation: Secondary | ICD-10-CM

## 2010-08-23 DIAGNOSIS — I059 Rheumatic mitral valve disease, unspecified: Secondary | ICD-10-CM

## 2010-08-24 LAB — BASIC METABOLIC PANEL
BUN: 22 mg/dL (ref 6–23)
GFR: 62.22 mL/min (ref >60.00)
Glucose, Bld: 116 mg/dL — ABNORMAL HIGH (ref 70–99)
Potassium: 4.7 mEq/L (ref 3.5–5.1)

## 2010-08-26 NOTE — Medication Information (Signed)
Summary: rov/pc  Anticoagulant Therapy  Managed by: Weston Brass, PharmD Referring MD: Gala Romney PCP: Dr Yves Dill Supervising MD: Clifton James MD, Cristal Deer Indication 1: Atrial Fibrillation Lab Used: [facility] [place] Site: [address] INR POC 4.0 INR RANGE 2-3  Dietary changes: no    Health status changes: no    Bleeding/hemorrhagic complications: no    Recent/future hospitalizations: no    Any changes in medication regimen? no    Recent/future dental: no  Any missed doses?: no       Is patient compliant with meds? yes       Allergies: No Known Drug Allergies  Anticoagulation Management History:      The patient is taking warfarin and comes in today for a routine follow up visit.  Positive risk factors for bleeding include an age of 75 years or older.  The bleeding index is 'intermediate risk'.  Positive CHADS2 values include History of CHF and Age > 75 years old.  Anticoagulation responsible provider: Clifton James MD, Cristal Deer.  INR POC: 4.0.  Cuvette Lot#: [id number].  Exp: 06/2011.    Anticoagulation Management Assessment/Plan:      The patient's current anticoagulation dose is Warfarin sodium 5 mg tabs: Use as directed by Anticoagulation Clinic.  The target INR is 2.0-3.0.  The next INR is due 08/27/2010.  Anticoagulation instructions were given to patient/Daughter.  Results were reviewed/authorized by Weston Brass, PharmD.  She was notified by Weston Brass PharmD.         Prior Anticoagulation Instructions: INR 4.0  Skip tomorrow's dosage, start taking 1 tablet daily except 1.5 tablets on Fridays.  Recheck in 1 week.   Current Anticoagulation Instructions: INR 4.0  Skip today's dose of Coumadin then decrease dose to 1 tablet every day except 1/2 tablet on Monday and Friday.  Recheck INR in 1 week.

## 2010-08-27 ENCOUNTER — Encounter: Payer: Self-pay | Admitting: Cardiology

## 2010-08-27 ENCOUNTER — Encounter (INDEPENDENT_AMBULATORY_CARE_PROVIDER_SITE_OTHER): Payer: Medicare Other

## 2010-08-27 DIAGNOSIS — Z7901 Long term (current) use of anticoagulants: Secondary | ICD-10-CM

## 2010-08-27 DIAGNOSIS — I4891 Unspecified atrial fibrillation: Secondary | ICD-10-CM

## 2010-08-31 NOTE — Assessment & Plan Note (Signed)
Summary: 2wk f/u sl   Visit Type:  Follow-up Primary Provider:  Dr Yves Dill  CC:  fatigue.  History of Present Illness: 77 y/o woman (mother of Dr. Drue Second) with h/o HTN, DM2 x 15 years, hyperlipidemia, AF presents for further management of CHF due to NICM diagnosed in 06/2010.   According to previous notes had echo in 3/11 with EF >55%. no mention of MR. Echo 12/11 EF 40-45% mild LVH.   Underwent cath January 2012 for CP. Non-obstructive CAD with LAD 40-50% mild.  EF 20-25% with severe global HK.  RA 9, RV 43/7/10, PA 40/18 (30), PCWP mean 22 with V-wave of 30, central aortic pressure 117/57 with a mean of 82, LV pressure 116/14/17.  There was no aortic stenosis. Fick cardiac output was 4.1 with a cardiac index of 2.3.  Echo  07/06/10 EF 20-25% with moderate to severe MR due to dilated annulus. Cardiac MRI LV markedly dilated EF 24% Significant RV dysfunction. No hyperenhancement. No comment on MR.  Prior to cathshe saw Dr. Deno Lunger at [facility] for AF and pparently Ms. Park Breed has been in and out of AF in past and was placed on amiodarone in [place] but was unable to tolerate due to GI side effects (?dose). Had DC-CV in past but reverted to AF quickly.  We have been titrating her meds q2 weeks. at last visit cardvediolo increased to 9.375 bid and digoxin added. Having good days and bad days but overall with mild improvement. Though her family reports that she appears more sluggish recently. Recently able to walk 2-3 km slowly without problem.  Gets dyspneic  if walks faster or has to go up an incline.  No orthopnea, PND or edema.   Scheduled to see Drs. Nolon Rod and Gasper Lloyd at [facility] on Mar 14th for further evaluation of MR and candidacy for MV clip or repair.   Current Medications (verified): 1)  Warfarin Sodium 5 Mg Tabs (Warfarin Sodium) .... Use As Directed By Anticoagulation Clinic 2)  Diovan 320 Mg Tabs (Valsartan) .... Take One Tablet By Mouth Daily 3)  Glimepiride 2 Mg Tabs  (Glimepiride) .... Take 1 Tablet By Mouth Two Times A Day 4)  Metformin Hcl 500 Mg Tabs (Metformin Hcl) .Marland Kitchen.. 1 Tab in The Am and 2 in The Pm 5)  Crestor 20 Mg Tabs (Rosuvastatin Calcium) .... Take One Tablet By Mouth Daily. 6)  Furosemide 20 Mg Tabs (Furosemide) .... Take One Tablet By Mouth Daily. 7)  Aspirin 81 Mg Tbec (Aspirin) .... Take One Tablet By Mouth Daily 8)  Multivitamins   Tabs (Multiple Vitamin) .... Once Daily 9)  Calcium Carbonate-Vitamin D 600-400 Mg-Unit  Tabs (Calcium Carbonate-Vitamin D) .... Once Daily 10)  Dexilant 60 Mg Cpdr (Dexlansoprazole) .... Once Daily 11)  Carvedilol 6.25 Mg Tabs (Carvedilol) .... Take 1 Tablet By Mouth Two Times A Day 12)  Angised Tabllet .... As Needed For Chest Pains 13)  Digoxin 0.125 Mg Tabs (Digoxin) .... Take One Tablet By Mouth Daily 14)  Carvedilol 3.125 Mg Tabs (Carvedilol) .... Take One Tablet By Mouth Twice A Day 15)  Januvia 100 Mg Tabs (Sitagliptin Phosphate) .... Take 1 Tablet By Mouth Once A Day 16)  Carvedilol 6.25 Mg Tabs (Carvedilol) .... Take One Tablet By Mouth Twice A Day 17)  Nexium 40 Mg Cpdr (Esomeprazole Magnesium) .... Take 1 Tablet By Mouth Once A Day  Allergies (verified): No Known Drug Allergies  Review of Systems       As per  HPI and past medical history; otherwise all systems negative.   Vital Signs:  Patient profile:   77 year old female Height:      62 inches Weight:      155.25 pounds BMI:     28.50 Pulse rate:   57 / minute Pulse rhythm:   irregular Resp:     18 per minute BP sitting:   114 / 60  (left arm) Cuff size:   large  Vitals Entered By: Vikki Ports (August 23, 2010 4:31 PM)  Physical Exam  General:  well appearing. no resp difficulty HEENT: normal Neck: supple. no JVP 5 Carotids 2+ bilat; no bruits. No lymphadenopathy or thryomegaly appreciated. Cor: PMI nondisplaced. Irregular rate & rhythm. No rubs, gallops. 2/6 sys murmur at apex Lungs: clear Abdomen: soft, nontender,  nondistended. No hepatosplenomegaly. No bruits or masses. Good bowel sounds. Extremities: no cyanosis, clubbing, rash. no edema Neuro: alert & orientedx3, cranial nerves grossly intact. moves all 4 extremities w/o difficulty. affect pleasant    Impression & Recommendations:  Problem # 1:  CHRONIC SYSTOLIC HEART FAILURE (ICD-428.22) Conitnues with NYHA II-III symptoms. Volume status much improved. Given bradycardia will stop digoxin to make room to titrate carvedilol to 12.5 two times a day. Due for repeat echo in April. If EF <= 35% will need to consider ICD.  Did discuss possible CPX but unlikely to change management at this point.  Problem # 2:  MITRAL VALVE DISORDERS (ICD-424.0) I reiterated that this was likely due to dilated annulus and would like to wait to see if MR improves with 3-6 months of medical therapy of HF. They have consultation at [facility] next week to discuss possible MVR or MV clip. will await Dr. Juanda Chance input on this.   Problem # 3:  ATRIAL FIBRILLATION (ICD-427.31) Chronic. Rate controlled. Continue coumadin. F/u with Dr. Laurice Record down the road.   Other Orders: TLB-BMP (Basic Metabolic Panel-BMET) (80048-METABOL) T-Digoxin (04540-98119) EKG w/ Interpretation (93000)  Patient Instructions: 1)  Stop Digoxin  2)  Increase Carvedilol to 12.5mg  two times a day  3)  Follow up in 1 month Prescriptions: CARVEDILOL 12.5 MG TABS (CARVEDILOL) Take one tablet by mouth twice a day  #60 x 6   Entered by:   Meredith Staggers, RN   Authorized by:   Dolores Patty, MD, [facility]   Signed by:   Meredith Staggers, RN on 08/23/2010   Method used:   Electronically to        [facility] [address] [phone]* (retail)       [address] [place], [place]  [id number]       Ph: [phone]       Fax: [phone]   RxID:   [id number]

## 2010-08-31 NOTE — Medication Information (Signed)
Summary: rov/sp  Anticoagulant Therapy  Managed by: Windell Hummingbird, RN Referring MD: Gala Romney PCP: Dr Yves Dill Supervising MD: Riley Kill MD, Maisie Fus Indication 1: Atrial Fibrillation Lab Used: [facility] [place] Site: [address] INR POC 4.6 INR RANGE 2-3  Dietary changes: no    Health status changes: no    Bleeding/hemorrhagic complications: no    Recent/future hospitalizations: no    Any changes in medication regimen? no    Recent/future dental: no  Any missed doses?: no       Is patient compliant with meds? yes       Allergies: No Known Drug Allergies  Anticoagulation Management History:      The patient is taking warfarin and comes in today for a routine follow up visit.  Positive risk factors for bleeding include an age of 75 years or older.  The bleeding index is 'intermediate risk'.  Positive CHADS2 values include History of CHF and Age > 5 years old.  Anticoagulation responsible provider: Riley Kill MD, Maisie Fus.  INR POC: 4.6.  Cuvette Lot#: [id number].  Exp: 06/2011.    Anticoagulation Management Assessment/Plan:      The patient's current anticoagulation dose is Warfarin sodium 5 mg tabs: Use as directed by Anticoagulation Clinic.  The target INR is 2.0-3.0.  The next INR is due 09/03/2010.  Anticoagulation instructions were given to patient/Daughter.  Results were reviewed/authorized by Windell Hummingbird, RN.  She was notified by Windell Hummingbird, RN.         Prior Anticoagulation Instructions: INR 4.0  Skip today's dose of Coumadin then decrease dose to 1 tablet every day except 1/2 tablet on Monday and Friday.  Recheck INR in 1 week.   Current Anticoagulation Instructions: INR 4.6 Skip today and tomorrow's  dose.  Then, begin taking 1 tablet every day, except take 1/2 tablet on Mondays, Wednesdays, and Fridays. Recheck in 1 week.

## 2010-09-03 ENCOUNTER — Encounter (INDEPENDENT_AMBULATORY_CARE_PROVIDER_SITE_OTHER): Payer: Medicare Other

## 2010-09-03 ENCOUNTER — Encounter: Payer: Self-pay | Admitting: Cardiology

## 2010-09-03 ENCOUNTER — Encounter: Payer: Self-pay | Admitting: Internal Medicine

## 2010-09-03 DIAGNOSIS — I4891 Unspecified atrial fibrillation: Secondary | ICD-10-CM

## 2010-09-03 DIAGNOSIS — I059 Rheumatic mitral valve disease, unspecified: Secondary | ICD-10-CM

## 2010-09-03 DIAGNOSIS — Z7901 Long term (current) use of anticoagulants: Secondary | ICD-10-CM

## 2010-09-03 LAB — CONVERTED CEMR LAB: POC INR: 2.5

## 2010-09-07 NOTE — Medication Information (Signed)
Summary: rov/pc  Anticoagulant Therapy  Managed by: Windell Hummingbird, RN Referring MD: Gala Romney PCP: Dr Yves Dill Supervising MD: Daleen Squibb MD, Maisie Fus Indication 1: Atrial Fibrillation Lab Used: [facility] [place] Site: [address] INR POC 2.5 INR RANGE 2-3  Dietary changes: no    Health status changes: no    Bleeding/hemorrhagic complications: yes       Details: "penny-sized " bruise  Recent/future hospitalizations: yes       Details: cardioversion scheduled next week at [facility]  Any changes in medication regimen? no    Recent/future dental: no  Any missed doses?: no       Is patient compliant with meds? yes       Allergies: No Known Drug Allergies  Anticoagulation Management History:      The patient is taking warfarin and comes in today for a routine follow up visit.  Positive risk factors for bleeding include an age of 68 years or older.  The bleeding index is 'intermediate risk'.  Positive CHADS2 values include History of CHF and Age > 78 years old.  Anticoagulation responsible provider: Daleen Squibb MD, Maisie Fus.  INR POC: 2.5.  Cuvette Lot#: [id number].  Exp: 08/2011.    Anticoagulation Management Assessment/Plan:      The patient's current anticoagulation dose is Warfarin sodium 5 mg tabs: Use as directed by Anticoagulation Clinic.  The target INR is 2.0-3.0.  The next INR is due 09/13/2010.  Anticoagulation instructions were given to patient/Daughter.  Results were reviewed/authorized by Windell Hummingbird, RN.  She was notified by Windell Hummingbird, RN.         Prior Anticoagulation Instructions: INR 4.6 Skip today and tomorrow's  dose.  Then, begin taking 1 tablet every day, except take 1/2 tablet on Mondays, Wednesdays, and Fridays. Recheck in 1 week.  Current Anticoagulation Instructions: INR 2.5 Begin taking 1/2 tablet every day, except take 1 tablet on Mondays, Wednesdays, and Fridays. Recheck in 1 week.

## 2010-09-10 ENCOUNTER — Ambulatory Visit (INDEPENDENT_AMBULATORY_CARE_PROVIDER_SITE_OTHER): Payer: Medicare Other | Admitting: *Deleted

## 2010-09-10 DIAGNOSIS — Z7901 Long term (current) use of anticoagulants: Secondary | ICD-10-CM

## 2010-09-10 DIAGNOSIS — I059 Rheumatic mitral valve disease, unspecified: Secondary | ICD-10-CM

## 2010-09-10 DIAGNOSIS — I4891 Unspecified atrial fibrillation: Secondary | ICD-10-CM

## 2010-09-10 LAB — POCT INR: INR: 3.2

## 2010-09-10 NOTE — Patient Instructions (Signed)
INR 3.2 Today only take 1/2 tablet then change dose to 1/2  Tablet daily except 1 tablet on Mondays and Fridays. Recheck in 10 days.

## 2010-09-13 ENCOUNTER — Encounter: Payer: Medicare Other | Admitting: *Deleted

## 2010-09-14 ENCOUNTER — Encounter: Payer: Medicare Other | Admitting: *Deleted

## 2010-09-14 ENCOUNTER — Encounter: Payer: Self-pay | Admitting: Internal Medicine

## 2010-09-20 ENCOUNTER — Ambulatory Visit (INDEPENDENT_AMBULATORY_CARE_PROVIDER_SITE_OTHER): Payer: Medicare Other | Admitting: *Deleted

## 2010-09-20 DIAGNOSIS — I059 Rheumatic mitral valve disease, unspecified: Secondary | ICD-10-CM

## 2010-09-20 DIAGNOSIS — Z7901 Long term (current) use of anticoagulants: Secondary | ICD-10-CM | POA: Insufficient documentation

## 2010-09-20 DIAGNOSIS — I4891 Unspecified atrial fibrillation: Secondary | ICD-10-CM

## 2010-09-20 NOTE — Patient Instructions (Signed)
Continue on same dosage 1/2 tablet daily except 1 tablet on Mondays and Fridays.  Recheck in 2 weeks.

## 2010-09-24 ENCOUNTER — Other Ambulatory Visit (HOSPITAL_COMMUNITY): Payer: Medicare Other

## 2010-09-27 ENCOUNTER — Other Ambulatory Visit (HOSPITAL_COMMUNITY): Payer: Self-pay | Admitting: Radiology

## 2010-09-27 ENCOUNTER — Ambulatory Visit (HOSPITAL_COMMUNITY): Payer: Medicare Other | Attending: Internal Medicine | Admitting: Radiology

## 2010-09-27 DIAGNOSIS — I379 Nonrheumatic pulmonary valve disorder, unspecified: Secondary | ICD-10-CM | POA: Insufficient documentation

## 2010-09-27 DIAGNOSIS — I5022 Chronic systolic (congestive) heart failure: Secondary | ICD-10-CM | POA: Insufficient documentation

## 2010-09-27 DIAGNOSIS — I059 Rheumatic mitral valve disease, unspecified: Secondary | ICD-10-CM | POA: Insufficient documentation

## 2010-09-27 DIAGNOSIS — R079 Chest pain, unspecified: Secondary | ICD-10-CM | POA: Insufficient documentation

## 2010-09-27 DIAGNOSIS — I4891 Unspecified atrial fibrillation: Secondary | ICD-10-CM | POA: Insufficient documentation

## 2010-09-27 DIAGNOSIS — I079 Rheumatic tricuspid valve disease, unspecified: Secondary | ICD-10-CM | POA: Insufficient documentation

## 2010-10-01 ENCOUNTER — Other Ambulatory Visit (HOSPITAL_COMMUNITY): Payer: Medicare Other

## 2010-10-04 ENCOUNTER — Ambulatory Visit (INDEPENDENT_AMBULATORY_CARE_PROVIDER_SITE_OTHER): Payer: Medicare Other | Admitting: *Deleted

## 2010-10-04 DIAGNOSIS — I059 Rheumatic mitral valve disease, unspecified: Secondary | ICD-10-CM

## 2010-10-04 DIAGNOSIS — I4891 Unspecified atrial fibrillation: Secondary | ICD-10-CM

## 2010-10-04 LAB — POCT INR: INR: 1.8

## 2010-10-15 ENCOUNTER — Encounter: Payer: Medicare Other | Admitting: *Deleted

## 2010-10-18 ENCOUNTER — Encounter: Payer: Medicare Other | Admitting: *Deleted

## 2010-10-25 ENCOUNTER — Encounter: Payer: Medicare Other | Admitting: *Deleted

## 2010-10-25 ENCOUNTER — Ambulatory Visit (INDEPENDENT_AMBULATORY_CARE_PROVIDER_SITE_OTHER): Payer: Medicare Other | Admitting: *Deleted

## 2010-10-25 DIAGNOSIS — I4891 Unspecified atrial fibrillation: Secondary | ICD-10-CM

## 2010-10-25 DIAGNOSIS — I059 Rheumatic mitral valve disease, unspecified: Secondary | ICD-10-CM

## 2010-10-25 LAB — POCT INR: INR: 2

## 2010-11-01 ENCOUNTER — Ambulatory Visit (INDEPENDENT_AMBULATORY_CARE_PROVIDER_SITE_OTHER): Payer: Medicare Other | Admitting: *Deleted

## 2010-11-01 ENCOUNTER — Telehealth: Payer: Self-pay | Admitting: Internal Medicine

## 2010-11-01 DIAGNOSIS — I4891 Unspecified atrial fibrillation: Secondary | ICD-10-CM

## 2010-11-01 DIAGNOSIS — I059 Rheumatic mitral valve disease, unspecified: Secondary | ICD-10-CM

## 2010-11-01 NOTE — Telephone Encounter (Signed)
Pt Signed ROI, picked up CD of Echo  11/01/10/km

## 2010-11-05 NOTE — Op Note (Signed)
April Park, April Park                 ACCOUNT NO.:  [id number]   MEDICAL RECORD NO.:  [id number]          PATIENT TYPE:  AMB   LOCATION:  ENDO                         FACILITY:  [facility]   PHYSICIAN:  Anselmo Rod, M.D.  DATE OF BIRTH:  02-14-1932   DATE OF PROCEDURE:  03/04/2005  DATE OF DISCHARGE:                                 OPERATIVE REPORT   PROCEDURE PERFORMED:  Esophagogastroduodenoscopy with biopsies.   ENDOSCOPIST:  Charna Elizabeth, M.D.   INSTRUMENT USED:  Olympus video panendoscope.   INDICATIONS FOR PROCEDURE:  The patient is a 77 year old [place] female  with a history of severe epigastric pain not responding to proton pump  inhibitor, rule out peptic ulcer disease, esophagitis, gastritis, etc.   PREPROCEDURE PREPARATION:  Informed consent was procured from the patient.  The patient was fasted for eight hours prior to the procedure.   PREPROCEDURE PHYSICAL:  The patient had stable vital signs.  Neck supple,  chest clear to auscultation.  Abdomen soft with normal bowel sounds.   DESCRIPTION OF PROCEDURE:  The patient was placed in the left lateral  decubitus position and sedated with 75 mg of Demerol and 6 mg of Versed in  slow incremental doses.  Once the patient was adequately sedated and  maintained on low-flow oxygen and continuous cardiac monitoring, the Olympus  video panendoscope was advanced through the mouth piece over the tongue into  the esophagus under direct vision.  The entire esophagus was widely patent  with no evidence of ring, stricture, masses, esophagitis or Barrett's  mucosa.  The scope was then advanced to the stomach.  The patient had severe  gastritis with a small hiatal hernia seen on high retroflexion.  Biopsies  were done from the stomach to rule out presence of Helicobacter pylori by  pathology.  There were erosions noted in the duodenal bulb.  Small bowel  distal to the bulb up to 60 cm appeared normal.  There was no outlet  obstruction.   The patient tolerated the procedure well without  complications.   IMPRESSION:  1.  Normal-appearing esophagus.  2.  Small hiatal hernia.  3.  Severe diffuse gastritis; ? due to fosamax use. Biopsy done for      Helicobacter pylori.  4.  Erosions in the duodenal bulb.  5.  Small bowel distal to the bulb normal up to 60 cm.   RECOMMENDATIONS:  1.  Await pathology results.  2.  Treat with antibiotics if Helicobacter pylori present on biopsies.  3.  Avoid all nonsteroidals.  4.  Continue proton pump inhibitor, increase the dose to a twice daily      protocol.  5.  Proceed with colonoscopy at this time.  Further recommendations will be      made thereafter.  6.  Discuss with Dr. Yves Dill about changing Fosamax to [facility].      Anselmo Rod, M.D.  Electronically Signed     JNM/MEDQ  D:  03/04/2005  T:  03/04/2005  Job:  [id number]   cc:   Yves Dill  [address]  [place]  [place] [address]  Fax: [phone]   Eduardo Osier. Sharyn Lull, M.D.  Fax: [phone]

## 2010-11-05 NOTE — Op Note (Signed)
April Park, April Park                 ACCOUNT NO.:  [id number]   MEDICAL RECORD NO.:  [id number]          PATIENT TYPE:  AMB   LOCATION:  ENDO                         FACILITY:  [facility]   PHYSICIAN:  Anselmo Rod, M.D.  DATE OF BIRTH:  07-31-1931   DATE OF PROCEDURE:  03/04/2005  DATE OF DISCHARGE:                                 OPERATIVE REPORT   PROCEDURE PERFORMED:  Screening colonoscopy.   ENDOSCOPIST:  Anselmo Rod, M.D.   INSTRUMENT USED:  Olympus video colonoscope.   INDICATIONS FOR PROCEDURE:  A 77 year old [place] female undergoing  screening colonoscopy.  The patient has a history of chronic constipation  and abdominal discomfort.  Rule out colonic polyps, masses, etc.   PREPROCEDURE PREPARATION:  Informed consent was procured from the patient  and the patient had fasted for 8 hours prior to the procedure and prepped  with OsmoPrep pills, 20 pills a night prior to and 12 pills the morning of  the procedure.   PREPROCEDURE PHYSICAL:  The patient had vital signs, neck supple, chest  clear to auscultation, S1 and S2 regular, and abdomen was soft with normal  bowel sounds.   DESCRIPTION OF PROCEDURE:  The patient was placed in the left lateral  decubitus position and no additional sedation was used after the EGD.  Once  the patient was adequately positioned, the Olympus video colonoscope was  advanced from the rectum to the cecum and the appendiceal orifice and  ileocecal valve were clearly visualized and photographed.  No masses,  polyps, erosions, ulcerations, or diverticula were seen.  Prominent internal  hemorrhoids were appreciated on retroflexion in the rectum.  The patient  tolerated the procedure well without complications.   IMPRESSION:  Normal colonoscopy up to the cecum.  No masses, polyps or  diverticula seen.  Prominent, nonbleeding internal hemorrhoids.   RECOMMENDATIONS:  1.Repeat colonoscopy has been recommended in the next 10  years unless the  patient develops any abnormal symptoms in the interim.  2.Outpatient follow-up as the need arises in the future.  3.Continue a high fiber diet with liberal fluid intake.  4.Colace at bedtime as needed.      Anselmo Rod, M.D.  Electronically Signed     JNM/MEDQ  D:  03/04/2005  T:  03/04/2005  Job:  [id number]   cc:   Yves Dill  [address]  [place]  [place] [address]  Fax: [phone]

## 2010-11-05 NOTE — Cardiovascular Report (Signed)
April Park, April Park                 ACCOUNT NO.:  [id number]   MEDICAL RECORD NO.:  [id number]          PATIENT TYPE:  OIB   LOCATION:  2852                         FACILITY:  [facility]   PHYSICIAN:  Mohan N. Sharyn Lull, M.D. DATE OF BIRTH:  04/21/1932   DATE OF PROCEDURE:  02/22/2005  DATE OF DISCHARGE:  02/22/2005                              CARDIAC CATHETERIZATION   PROCEDURE:  Left cardiac cath with selective left and right coronary  angiography, left ventriculography via right groin using Judkins technique.   INDICATIONS FOR PROCEDURE:  April Park is 77 year old Asian female with past  medical history significant for non-insulin-dependent diabetes mellitus,  hypercholesteremia, complaints of vague retrosternal chest pain localized  lasting for approximately 10 to 15 minutes without associated symptoms of  nausea, vomiting or diaphoresis. Denies palpitation, lightheadedness or  syncope. Denies PND, orthopnea, leg swelling. The patient subsequently  underwent stress Myoview on February 15, 2005 which showed fixed inferior wall  scarring with mild hypokinesia with EF of 45%. At the peak of exercise, the  patient had frequent episodes of ventricular bigeminy couplets and PVCs. The  patient presently denies any chest pain, shortness of breath.   PAST MEDICAL HISTORY:  As above.   PAST SURGICAL HISTORY:  None.   ALLERGIES:  None.   MEDICATIONS:  She is on Lipitor 20 p.o. q.d., Glucophage XR 500 milligrams  p.o. b.i.d. which has been stopped since yesterday, Amaryl 2 milligrams p.o.  b.i.d., enteric-coated aspirin 81 milligrams p.o. q.d., Fosamax 70  milligrams p.o. weekly, Protonix 40 milligrams p.o. b.i.d., calcium and  vitamin tablet daily.   SOCIAL HISTORY:  She is married, housewife. No history of alcohol abuse or  tobacco abuse. She is retired.   FAMILY HISTORY:  Mother is alive. She is in her 90s, has coronary artery  disease. Father died of accidental death. She has three sisters  and four  brothers-one sister is diabetic.   PHYSICAL EXAMINATION:  GENERAL:  She is alert and oriented x3 in no acute  distress.  VITAL SIGNS:  Blood pressure was 129/75, pulse was 65 regular.  HEENT:  Conjunctivae was pink.  NECK:  Supple. No JVD, no bruit.  LUNGS:  Lungs were clear to auscultation without rhonchi or rales.  CARDIOVASCULAR:  S1 and S2 was normal. There was no S3 gallop.  ABDOMEN:  Soft. Bowel sounds present, nontender. There was no mass or  organomegaly.  EXTREMITIES:  There is no clubbing, cyanosis or edema.   LABORATORY DATA:  Hemoglobin was 13, hematocrit 41, white count 6.3. Glucose  was 178, potassium was 4.8, BUN 13, creatinine of 0.7.   IMPRESSION:  1.  Chest pain, rule out coronary insufficiency.  2.  Coronary artery disease, questionable silent inferior wall myocardial      infarction.  3.  Non-insulin diabetes mellitus.  4.  Hypercholesteremia.  5.  Gastroesophageal reflux disease.  6.  Degenerative joint disease.   Discussed with the patient and her husband regarding left cath, possible  PTCA stenting its risks and benefits and consented for the procedure.   PROCEDURE:  After obtaining the informed  consent, the patient was brought to  the cath lab and was placed on fluoroscopy table. The right groin was  prepped and draped in usual fashion. 2% Xylocaine was used for local  anesthesia in the right groin. With the help of thin-wall needle, 6-French  arterial sheath was placed. The sheath was aspirated and flushed. Next, 6-  French left Judkins catheter was advanced over the wire under fluoroscopic  guidance up to the ascending aorta. Wire was pulled out, the catheter was  aspirated and connected to the manifold. Catheter was further advanced and  engaged into left coronary ostium. Next, multiple views of the left system  were taken. Next, the catheter was disengaged and was pulled out over the  wire and was replaced with 6-French right Judkins  catheter which was  advanced over the wire under fluoroscopic guidance up to the ascending  aorta. Wire was pulled out, the catheter was aspirated and connected to the  manifold. Catheter was further advanced and engaged into right coronary  ostium.   Multiple views of the right system were taken. Next, the catheter was  disengaged and was pulled out over the wire and was replaced with 6-French  pigtail catheter which was advanced over the wire under fluoroscopic  guidance up to the ascending aorta. Wire was pulled out, the catheter was  aspirated and connected to the manifold. Catheter was further advanced  across aortic valve into the LV. LV pressures were recorded. Next, LV graft  was done in 30 degree RAO position. Post angiographic pressures were  recorded from LV and then pullback pressures were recorded from the aorta.  There was no gradient across aortic valve. Next, a pigtail catheter was  pulled out over the wire. Sheaths were aspirated and flushed.   FINDINGS:  LV was mildly enlarged. There was global hypokinesia, EF of  approximately 35-40%. Left main was patent. LAD has 20-25% mid stenosis  which tapers down distally before reaching the apex. Diagonal-1 to diagonal-  3 were very small which were patent. Left circumflex was patent proximally  and then tapers down in AV groove after giving off large OM-2. OM-1 was  small which was patent. OM-2 was large which was patent. RCA has 20% mid  stenosis. The patient tolerated procedure well. There were no complications.   Plan is to continue with present medications. Hold metformin for 48 hours.  We will add low-dose beta-blockers, low-dose Coreg 3.125 milligrams q.12h.  and Altace 2.5 milligrams daily and increase ACE and beta blockers as BP  tolerates.           ______________________________  Eduardo Osier Sharyn Lull, M.D.    MNH/MEDQ  D:  02/22/2005  T:  02/22/2005  Job:  [id number]   cc:   Fae Pippin, M.D.   Catheter Lab

## 2010-11-05 NOTE — Procedures (Signed)
[facility]  Patient:    April Park, April Park Visit Number: [id number] MRN: [id number]          Service Type: END Location: ENDO Attending Physician:  Charna Elizabeth Dictated by:   Anselmo Rod, M.D. Proc. Date: 01/04/02 Admit Date:  01/04/2002 Discharge Date: 01/04/2002   CC:         Yves Dill, M.D., [place], [place]   Procedure Report  DATE OF BIRTH:  Jun 23, 1931.  PROCEDURE:  Screening colonoscopy.  ENDOSCOPIST:  Anselmo Rod, M.D.  INSTRUMENT USED:  Olympus video colonoscope.  INDICATION FOR PROCEDURE:  A 77 year old [place] female with a history of chronic constipation.  Rule out colonic polyps, masses, etc.  PREPROCEDURE PREPARATION:  Informed consent was procured from the patient. The patient was fasted for eight hours prior to the procedure and prepped with a bottle of magnesium citrate and a gallon of NuLytely the night prior to the procedure.  The patient was also given four to five glasses of NuLytely for a week prior to the procedure.  PREPROCEDURE PHYSICAL:  VITAL SIGNS:  The patient had stable vital signs.  NECK:  Supple.  CHEST:  Clear to auscultation.  S1, S2 regular.  ABDOMEN:  Soft with normal bowel sounds.  DESCRIPTION OF PROCEDURE:  The patient was placed in the left lateral decubitus position and sedated with 50 mg of Demerol and 5 mg of Versed intravenously.  Once the patient was adequately sedate and maintained on low-flow oxygen and continuous cardiac monitoring, the Olympus video colonoscope was advanced from the rectum to the cecum without difficulty. The entire colonic mucosa appeared healthy with a normal vascular pattern, and visualization was adequate.  No masses, polyps, erosions, ulcerations, or diverticula were seen.  The patient had small, nonbleeding internal hemorrhoids best appreciated on retroflexion in the rectum.  The procedure was complete up to the terminal ileum.  The appendiceal  orifice and the ileocecal valve were clearly visualized and photographed.  The patient tolerated the procedure well without complication.  IMPRESSION: 1. Normal colonoscopy. 2. Small, nonbleeding internal hemorrhoids. 3. No masses, polyps, or ulcerations seen.  RECOMMENDATIONS: 1. Repeat colorectal cancer screening is recommended in the next 10 years    unless the patient develops any abnormal symptoms in the interim. 2. A high-fiber diet with liberal fluid intake recommended.  Fiber    supplements have been advised. 3. Outpatient follow-up on a p.r.n. basis. Dictated by:   Anselmo Rod, M.D. Attending Physician:  Charna Elizabeth DD:  01/04/02 TD:  01/09/02 Job: [id number]

## 2010-11-08 ENCOUNTER — Ambulatory Visit (INDEPENDENT_AMBULATORY_CARE_PROVIDER_SITE_OTHER): Payer: Medicare Other | Admitting: *Deleted

## 2010-11-08 DIAGNOSIS — I4891 Unspecified atrial fibrillation: Secondary | ICD-10-CM

## 2010-11-08 DIAGNOSIS — I059 Rheumatic mitral valve disease, unspecified: Secondary | ICD-10-CM

## 2010-11-08 LAB — POCT INR: INR: 2.6

## 2010-11-16 ENCOUNTER — Ambulatory Visit (INDEPENDENT_AMBULATORY_CARE_PROVIDER_SITE_OTHER): Payer: Medicare Other | Admitting: *Deleted

## 2010-11-16 DIAGNOSIS — I059 Rheumatic mitral valve disease, unspecified: Secondary | ICD-10-CM

## 2010-11-16 DIAGNOSIS — I4891 Unspecified atrial fibrillation: Secondary | ICD-10-CM

## 2010-11-16 LAB — POCT INR: INR: 2.4

## 2010-11-30 ENCOUNTER — Ambulatory Visit (INDEPENDENT_AMBULATORY_CARE_PROVIDER_SITE_OTHER): Payer: Medicare Other | Admitting: *Deleted

## 2010-11-30 DIAGNOSIS — I4891 Unspecified atrial fibrillation: Secondary | ICD-10-CM

## 2010-11-30 DIAGNOSIS — I059 Rheumatic mitral valve disease, unspecified: Secondary | ICD-10-CM

## 2010-11-30 LAB — POCT INR: INR: 2.1

## 2010-12-06 ENCOUNTER — Ambulatory Visit (INDEPENDENT_AMBULATORY_CARE_PROVIDER_SITE_OTHER): Payer: Medicare Other | Admitting: *Deleted

## 2010-12-06 DIAGNOSIS — I059 Rheumatic mitral valve disease, unspecified: Secondary | ICD-10-CM

## 2010-12-06 DIAGNOSIS — I4891 Unspecified atrial fibrillation: Secondary | ICD-10-CM

## 2010-12-13 ENCOUNTER — Ambulatory Visit (INDEPENDENT_AMBULATORY_CARE_PROVIDER_SITE_OTHER): Payer: Medicare Other | Admitting: *Deleted

## 2010-12-13 DIAGNOSIS — I059 Rheumatic mitral valve disease, unspecified: Secondary | ICD-10-CM

## 2010-12-13 DIAGNOSIS — I4891 Unspecified atrial fibrillation: Secondary | ICD-10-CM

## 2010-12-20 ENCOUNTER — Encounter: Payer: Self-pay | Admitting: Internal Medicine

## 2010-12-20 ENCOUNTER — Ambulatory Visit (INDEPENDENT_AMBULATORY_CARE_PROVIDER_SITE_OTHER): Payer: Medicare Other | Admitting: Internal Medicine

## 2010-12-20 ENCOUNTER — Ambulatory Visit (INDEPENDENT_AMBULATORY_CARE_PROVIDER_SITE_OTHER): Payer: Medicare Other | Admitting: *Deleted

## 2010-12-20 VITALS — BP 122/82 | HR 74 | Ht 62.0 in | Wt 158.1 lb

## 2010-12-20 DIAGNOSIS — I5022 Chronic systolic (congestive) heart failure: Secondary | ICD-10-CM

## 2010-12-20 DIAGNOSIS — I4891 Unspecified atrial fibrillation: Secondary | ICD-10-CM

## 2010-12-20 DIAGNOSIS — I059 Rheumatic mitral valve disease, unspecified: Secondary | ICD-10-CM

## 2010-12-20 LAB — POCT INR: INR: 3

## 2010-12-20 MED ORDER — DOFETILIDE 125 MCG PO CAPS: 125.0000 ug | ORAL_CAPSULE | Freq: Two times a day (BID) | ORAL | Status: DC

## 2010-12-20 MED ORDER — CARVEDILOL 6.25 MG PO TABS: 6.2500 mg | ORAL_TABLET | Freq: Two times a day (BID) | ORAL | Status: DC

## 2010-12-20 MED ORDER — WARFARIN SODIUM 5 MG PO TABS: 5.0000 mg | ORAL_TABLET | ORAL | Status: DC

## 2010-12-20 NOTE — Patient Instructions (Signed)
Start Carvedilol 6.25 mg tabs in evening for a while to get use to increased dose then start taking in AM as well, take along with 12.5 mg tabs to equal 18.75 mg Twice daily   Your physician recommends that you schedule a follow-up appointment in: 4 months.

## 2010-12-20 NOTE — Progress Notes (Signed)
HPI:  77 y/o woman (mother of Dr. Drue Second) with h/o HTN, DM2 x 15 years, hyperlipidemia, AF presents for further management of CHF due to NICM diagnosed in 06/2010.   According to previous notes had echo in 3/11 with EF >55%. no mention of MR. Echo 12/11 EF 40-45% mild LVH.   Underwent cath January 2012 for CP. Non-obstructive CAD with LAD 40-50% mild.  EF 20-25% with severe global HK.  RA 9, RV 43/7/10, PA 40/18 (30), PCWP mean 22 with V-wave of 30, central aortic pressure 117/57 with a mean of 82, LV pressure 116/14/17.  There was no aortic stenosis. Fick cardiac output was 4.1 with a cardiac index of 2.3.  Echo  07/06/10 EF 20-25% with moderate to severe MR due to dilated annulus. Cardiac MRI LV markedly dilated EF 24% Significant RV dysfunction. No hyperenhancement. No comment on MR.  Prior to cath she saw Dr. Deno Lunger at [facility] for AF and apparently Ms. Park Breed has been in and out of AF in past and was placed on amiodarone in [place] but was unable to tolerate due to GI side effects (?dose). Had DC-CV in past but reverted to AF quickly.  Underwent initiation of Tikosyn with cardioversion at [facility].  Subsequently has had complete recovery of LV function with medical therapy. Echo 11/22/10 with EF 63%. +diastolic dysfunction. Severe biatrial enlargement. No significant MR.    Doing very well. Walks up to 2 miles per day without dyspnea. Denies orthopnea or PND. No LE edema. Compliant with meds. Scheduled for AF ablation on July 16th.   ROS: All systems negative except as listed in HPI, PMH and Problem List.  Past Medical History  Diagnosis Date  . Chest pain   . CAD (coronary artery disease)     non-obstructive CAD on cath January 2012. mLAD 40-50%  . CHF (congestive heart failure)     due to recurrent NICM echo 06/2010: EF 20-25% mod-severe MR, moderate TR  . A-fib     failed amio due to Gi side effects (in [place])   . Hyperlipidemia   . Hypertension   . Diabetes mellitus      Current Outpatient Prescriptions  Medication Sig Dispense Refill  . calcium-vitamin D 250-100 MG-UNIT per tablet Take 1 tablet by mouth daily.        . carvedilol (COREG) 12.5 MG tablet Take 12.5 mg by mouth 2 (two) times daily with a meal.        . furosemide (LASIX) 20 MG tablet Take 20 mg by mouth daily.        Marland Kitchen glimepiride (AMARYL) 2 MG tablet Take 2 mg by mouth. Taking 2 tablets in the am and 1 tablet in pm      . metFORMIN (GLUMETZA) 500 MG (MOD) 24 hr tablet 500 mg. 1 every morning        . pantoprazole (PROTONIX) 40 MG tablet Take 40 mg by mouth daily. As needed       . rosuvastatin (CRESTOR) 20 MG tablet Take 20 mg by mouth daily.        . sitaGLIPtan (JANUVIA) 100 MG tablet Take 50 mg by mouth daily.       . traMADol (ULTRAM) 50 MG tablet Take 50 mg by mouth as needed.       . valsartan (DIOVAN) 80 MG tablet Take 80 mg by mouth daily.        Marland Kitchen warfarin (COUMADIN) 5 MG tablet Take by mouth as directed.        Marland Kitchen  Multiple Vitamin (MULTIVITAMIN) capsule Take 1 capsule by mouth daily.           PHYSICAL EXAM: Filed Vitals:   12/20/10 0930  BP: 122/82  Pulse: 74   General:  Well appearing. No resp difficulty HEENT: normal Neck: supple. JVP flat. Carotids 2+ bilaterally; no bruits. No lymphadenopathy or thryomegaly appreciated. Cor: PMI normal. Regular rate & rhythm. No rubs, gallops or murmurs. Lungs: clear Abdomen: soft, nontender, nondistended. No hepatosplenomegaly. No bruits or masses. Good bowel sounds. Extremities: no cyanosis, clubbing, rash, edema Neuro: alert & orientedx3, cranial nerves grossly intact. Moves all 4 extremities w/o difficulty. Affect pleasant.    ECG: A. Pacing 62. LVH with repol   ASSESSMENT & PLAN:

## 2010-12-22 NOTE — Assessment & Plan Note (Signed)
She is doing very well. Functional capacity much improved NYHA I-II. Volume status looks good. Will increase carvedilol to 15.5/18.75 and then to 18.75 bid.

## 2010-12-22 NOTE — Assessment & Plan Note (Addendum)
Followed by Dr. Berneice Gandy. Seems to be doing well with Tikosyn. We had an extensive discussion about this. Have suggested a watch and wait approach to ablation. They will d/w Dr. Berneice Gandy. Continue coumadin.

## 2010-12-27 ENCOUNTER — Ambulatory Visit (INDEPENDENT_AMBULATORY_CARE_PROVIDER_SITE_OTHER): Payer: Medicare Other | Admitting: *Deleted

## 2010-12-27 DIAGNOSIS — I059 Rheumatic mitral valve disease, unspecified: Secondary | ICD-10-CM

## 2010-12-27 DIAGNOSIS — I4891 Unspecified atrial fibrillation: Secondary | ICD-10-CM

## 2011-01-03 ENCOUNTER — Ambulatory Visit (INDEPENDENT_AMBULATORY_CARE_PROVIDER_SITE_OTHER): Payer: Medicare Other | Admitting: *Deleted

## 2011-01-03 DIAGNOSIS — I4891 Unspecified atrial fibrillation: Secondary | ICD-10-CM

## 2011-01-03 DIAGNOSIS — I059 Rheumatic mitral valve disease, unspecified: Secondary | ICD-10-CM

## 2011-01-03 LAB — POCT INR: INR: 2.4

## 2011-01-10 ENCOUNTER — Ambulatory Visit (INDEPENDENT_AMBULATORY_CARE_PROVIDER_SITE_OTHER): Payer: Medicare Other | Admitting: *Deleted

## 2011-01-10 DIAGNOSIS — I059 Rheumatic mitral valve disease, unspecified: Secondary | ICD-10-CM

## 2011-01-10 DIAGNOSIS — I4891 Unspecified atrial fibrillation: Secondary | ICD-10-CM

## 2011-01-17 ENCOUNTER — Ambulatory Visit (INDEPENDENT_AMBULATORY_CARE_PROVIDER_SITE_OTHER): Payer: Medicare Other | Admitting: *Deleted

## 2011-01-17 DIAGNOSIS — I059 Rheumatic mitral valve disease, unspecified: Secondary | ICD-10-CM

## 2011-01-17 DIAGNOSIS — I4891 Unspecified atrial fibrillation: Secondary | ICD-10-CM

## 2011-01-24 ENCOUNTER — Ambulatory Visit (INDEPENDENT_AMBULATORY_CARE_PROVIDER_SITE_OTHER): Payer: Medicare Other | Admitting: *Deleted

## 2011-01-24 DIAGNOSIS — I4891 Unspecified atrial fibrillation: Secondary | ICD-10-CM

## 2011-01-24 DIAGNOSIS — I059 Rheumatic mitral valve disease, unspecified: Secondary | ICD-10-CM

## 2011-01-24 LAB — POCT INR: INR: 3.5

## 2011-01-25 ENCOUNTER — Encounter: Payer: Medicare Other | Admitting: *Deleted

## 2011-02-03 ENCOUNTER — Ambulatory Visit (INDEPENDENT_AMBULATORY_CARE_PROVIDER_SITE_OTHER): Payer: Medicare Other | Admitting: *Deleted

## 2011-02-03 DIAGNOSIS — I059 Rheumatic mitral valve disease, unspecified: Secondary | ICD-10-CM

## 2011-02-03 DIAGNOSIS — I4891 Unspecified atrial fibrillation: Secondary | ICD-10-CM

## 2011-02-03 LAB — POCT INR: INR: 3.4

## 2011-02-07 ENCOUNTER — Encounter: Payer: Medicare Other | Admitting: *Deleted

## 2011-02-14 ENCOUNTER — Ambulatory Visit (INDEPENDENT_AMBULATORY_CARE_PROVIDER_SITE_OTHER): Payer: Medicare Other | Admitting: *Deleted

## 2011-02-14 DIAGNOSIS — I4891 Unspecified atrial fibrillation: Secondary | ICD-10-CM

## 2011-02-14 DIAGNOSIS — I059 Rheumatic mitral valve disease, unspecified: Secondary | ICD-10-CM

## 2011-02-14 LAB — POCT INR: INR: 2.7

## 2011-02-22 ENCOUNTER — Encounter: Payer: Medicare Other | Admitting: *Deleted

## 2011-02-28 ENCOUNTER — Ambulatory Visit (INDEPENDENT_AMBULATORY_CARE_PROVIDER_SITE_OTHER): Payer: Medicare Other | Admitting: *Deleted

## 2011-02-28 DIAGNOSIS — I4891 Unspecified atrial fibrillation: Secondary | ICD-10-CM

## 2011-02-28 DIAGNOSIS — I059 Rheumatic mitral valve disease, unspecified: Secondary | ICD-10-CM

## 2011-03-14 ENCOUNTER — Ambulatory Visit (INDEPENDENT_AMBULATORY_CARE_PROVIDER_SITE_OTHER): Payer: Medicare Other | Admitting: *Deleted

## 2011-03-14 DIAGNOSIS — I4891 Unspecified atrial fibrillation: Secondary | ICD-10-CM

## 2011-03-14 DIAGNOSIS — I059 Rheumatic mitral valve disease, unspecified: Secondary | ICD-10-CM

## 2011-03-17 DIAGNOSIS — E785 Hyperlipidemia, unspecified: Secondary | ICD-10-CM | POA: Insufficient documentation

## 2011-03-17 DIAGNOSIS — I10 Essential (primary) hypertension: Secondary | ICD-10-CM | POA: Insufficient documentation

## 2011-03-17 DIAGNOSIS — I340 Nonrheumatic mitral (valve) insufficiency: Secondary | ICD-10-CM | POA: Insufficient documentation

## 2011-03-17 DIAGNOSIS — I428 Other cardiomyopathies: Secondary | ICD-10-CM | POA: Insufficient documentation

## 2011-03-17 DIAGNOSIS — I2510 Atherosclerotic heart disease of native coronary artery without angina pectoris: Secondary | ICD-10-CM | POA: Insufficient documentation

## 2011-03-18 ENCOUNTER — Encounter: Payer: Self-pay | Admitting: Internal Medicine

## 2011-03-21 ENCOUNTER — Encounter: Payer: Self-pay | Admitting: Internal Medicine

## 2011-03-22 ENCOUNTER — Encounter: Payer: Self-pay | Admitting: Internal Medicine

## 2011-03-22 ENCOUNTER — Ambulatory Visit (INDEPENDENT_AMBULATORY_CARE_PROVIDER_SITE_OTHER): Payer: Medicare Other | Admitting: Internal Medicine

## 2011-03-22 VITALS — BP 106/64 | HR 84 | Ht 60.0 in | Wt 159.8 lb

## 2011-03-22 DIAGNOSIS — I5022 Chronic systolic (congestive) heart failure: Secondary | ICD-10-CM

## 2011-03-22 DIAGNOSIS — I4891 Unspecified atrial fibrillation: Secondary | ICD-10-CM

## 2011-03-22 MED ORDER — LISINOPRIL 5 MG PO TABS: 2.5000 mg | ORAL_TABLET | Freq: Every day | ORAL | Status: DC

## 2011-03-22 NOTE — Assessment & Plan Note (Signed)
Maintaining SR. Continue Tikosyn. F/u with Dr. Deno Lunger.

## 2011-03-22 NOTE — Assessment & Plan Note (Addendum)
Doing well. LV function has normalized. NYHA I. Volume status looks good. Have stressed need to continue ACE-I as BP tolerates. Will take lisinopril 2.5 qhs. Titrate as tolerated.Marland Kitchen

## 2011-03-22 NOTE — Patient Instructions (Signed)
Start Lisinopril 2.5 mg daily  Your physician recommends that you schedule a follow-up appointment in: 3 months

## 2011-03-22 NOTE — Progress Notes (Signed)
HPI:  77 y/o woman (mother of Dr. Drue Second) with h/o HTN, DM2 x 15 years, hyperlipidemia, AF presents for further management of CHF due to NICM diagnosed in 06/2010.   According to previous notes had echo in 3/11 with EF >55%. no mention of MR. Echo 12/11 EF 40-45% mild LVH.   Underwent cath January 2012 for CP. Non-obstructive CAD with LAD 40-50% mild.  EF 20-25% with severe global HK.  RA 9, RV 43/7/10, PA 40/18 (30), PCWP mean 22 with V-wave of 30, central aortic pressure 117/57 with a mean of 82, LV pressure 116/14/17.  There was no aortic stenosis. Fick cardiac output was 4.1 with a cardiac index of 2.3.  Echo  07/06/10 EF 20-25% with moderate to severe MR due to dilated annulus. Cardiac MRI LV markedly dilated EF 24% Significant RV dysfunction. No hyperenhancement. No comment on MR.  Prior to cath she saw Dr. Deno Lunger at [facility] for AF and apparently Ms. Park Breed has been in and out of AF in past and was placed on amiodarone in [place] but was unable to tolerate due to GI side effects (?dose). Had DC-CV in past but reverted to AF quickly.  Underwent initiation of Tikosyn with cardioversion at [facility].  Subsequently has had complete recovery of LV function with medical therapy. Echo 11/22/10 at [facility] with EF 60%. +diastolic dysfunction. Severe biatrial enlargement. No significant MR.    Doing very well. Walks up to 1-1.5 miles per day without dyspnea. Denies orthopnea or PND. No LE edema. Occasional PVCs. Compliant with meds. Underwent AF ablation on August 13th. Remains on Tikosyn. Scheduled for f/u Dr. Deno Lunger on October 31. Weight stable.   Stopped valsartan due to low BP. Dr. Regino Schultze prescribed lisinopril 2.5 daily. She took this once and tolerated well but has not continued on it.    ROS: All systems negative except as listed in HPI, PMH and Problem List.  Past Medical History  Diagnosis Date  . Chest pain   . CAD (coronary artery disease)     non-obstructive CAD on cath January 2012. mLAD  40-50%  . CHF (congestive heart failure)     due to recurrent NICM echo 06/2010: EF 20-25% mod-severe MR, moderate TR  . A-fib     failed amio due to Gi side effects (in [place])   . Hyperlipidemia   . Hypertension   . Diabetes mellitus     Current Outpatient Prescriptions  Medication Sig Dispense Refill  . calcium-vitamin D 250-100 MG-UNIT per tablet Take 1 tablet by mouth daily.        . carvedilol (COREG) 12.5 MG tablet Take 12.5 mg by mouth 2 (two) times daily with a meal.       . dofetilide (TIKOSYN) 125 MCG capsule Take 1 capsule (125 mcg total) by mouth 2 (two) times daily.  90 capsule  3  . furosemide (LASIX) 20 MG tablet Take 20 mg by mouth daily.        Marland Kitchen glimepiride (AMARYL) 2 MG tablet Take 2 mg by mouth 2 (two) times daily.       Marland Kitchen lisinopril (PRINIVIL,ZESTRIL) 2.5 MG tablet Take 2.5 mg by mouth daily.        . metFORMIN (GLUMETZA) 500 MG (MOD) 24 hr tablet 500 mg. 1 every morning        . Multiple Vitamin (MULTIVITAMIN) capsule Take 1 capsule by mouth daily.        . pantoprazole (PROTONIX) 40 MG tablet Take 40 mg by mouth as  needed. As needed      . rosuvastatin (CRESTOR) 10 MG tablet Take 10 mg by mouth daily.       . sitaGLIPtan (JANUVIA) 100 MG tablet Take 100 mg by mouth every morning.       . traMADol (ULTRAM) 50 MG tablet Take 50 mg by mouth as needed.       . valsartan (DIOVAN) 80 MG tablet Take 80 mg by mouth every morning.       . warfarin (COUMADIN) 5 MG tablet Take 1 tablet (5 mg total) by mouth as directed.  90 tablet  0     PHYSICAL EXAM: Filed Vitals:   03/22/11 1206  BP: 106/64  Pulse: 84   General:  Well appearing. No resp difficulty HEENT: normal Neck: supple. JVP flat. Carotids 2+ bilaterally; no bruits. No lymphadenopathy or thryomegaly appreciated. Cor: PMI normal. Regular rate & rhythm. No rubs, gallops. Trivial systolic murmur at apex. Lungs: clear Abdomen: soft, nontender, nondistended. No hepatosplenomegaly. No bruits or masses. Good  bowel sounds. Extremities: no cyanosis, clubbing, rash, edema Neuro: alert & orientedx3, cranial nerves grossly intact. Moves all 4 extremities w/o difficulty. Affect pleasant.   ECG: Sinus with v-pacing 84    ASSESSMENT & PLAN:

## 2011-03-23 ENCOUNTER — Ambulatory Visit (INDEPENDENT_AMBULATORY_CARE_PROVIDER_SITE_OTHER): Payer: Medicare Other | Admitting: Emergency Medicine

## 2011-03-23 DIAGNOSIS — I4891 Unspecified atrial fibrillation: Secondary | ICD-10-CM

## 2011-03-23 DIAGNOSIS — I059 Rheumatic mitral valve disease, unspecified: Secondary | ICD-10-CM

## 2011-03-23 LAB — POCT INR: INR: 2.9

## 2011-03-28 ENCOUNTER — Encounter: Payer: Medicare Other | Admitting: *Deleted

## 2011-04-01 ENCOUNTER — Encounter (HOSPITAL_COMMUNITY): Payer: Medicare Other

## 2011-04-06 ENCOUNTER — Encounter (HOSPITAL_COMMUNITY): Payer: Medicare Other | Attending: Endocrinology

## 2011-04-06 ENCOUNTER — Other Ambulatory Visit: Payer: Self-pay | Admitting: Endocrinology

## 2011-04-06 ENCOUNTER — Encounter: Payer: Medicare Other | Admitting: Emergency Medicine

## 2011-04-06 ENCOUNTER — Ambulatory Visit (INDEPENDENT_AMBULATORY_CARE_PROVIDER_SITE_OTHER): Payer: Medicare Other | Admitting: Emergency Medicine

## 2011-04-06 DIAGNOSIS — I059 Rheumatic mitral valve disease, unspecified: Secondary | ICD-10-CM

## 2011-04-06 DIAGNOSIS — Z7901 Long term (current) use of anticoagulants: Secondary | ICD-10-CM

## 2011-04-06 DIAGNOSIS — I4891 Unspecified atrial fibrillation: Secondary | ICD-10-CM

## 2011-04-06 DIAGNOSIS — M810 Age-related osteoporosis without current pathological fracture: Secondary | ICD-10-CM | POA: Insufficient documentation

## 2011-04-06 LAB — BASIC METABOLIC PANEL
BUN: 24 mg/dL — ABNORMAL HIGH (ref 6–23)
CO2: 24 mEq/L (ref 19–32)
Chloride: 98 mEq/L (ref 96–112)
Creatinine, Ser: 0.76 mg/dL (ref 0.50–1.10)
GFR calc Af Amer: >90 mL/min (ref >90)
Glucose, Bld: 178 mg/dL — ABNORMAL HIGH (ref 70–99)
Potassium: 5.4 mEq/L — ABNORMAL HIGH (ref 3.5–5.1)

## 2011-04-13 ENCOUNTER — Ambulatory Visit (INDEPENDENT_AMBULATORY_CARE_PROVIDER_SITE_OTHER): Payer: Medicare Other | Admitting: Emergency Medicine

## 2011-04-13 DIAGNOSIS — I059 Rheumatic mitral valve disease, unspecified: Secondary | ICD-10-CM

## 2011-04-13 DIAGNOSIS — Z7901 Long term (current) use of anticoagulants: Secondary | ICD-10-CM

## 2011-04-13 DIAGNOSIS — I4891 Unspecified atrial fibrillation: Secondary | ICD-10-CM

## 2011-04-20 ENCOUNTER — Other Ambulatory Visit: Payer: Self-pay | Admitting: Internal Medicine

## 2011-04-20 ENCOUNTER — Ambulatory Visit (INDEPENDENT_AMBULATORY_CARE_PROVIDER_SITE_OTHER): Payer: Medicare Other | Admitting: Emergency Medicine

## 2011-04-20 DIAGNOSIS — Z7901 Long term (current) use of anticoagulants: Secondary | ICD-10-CM

## 2011-04-20 DIAGNOSIS — I059 Rheumatic mitral valve disease, unspecified: Secondary | ICD-10-CM

## 2011-04-20 DIAGNOSIS — I4891 Unspecified atrial fibrillation: Secondary | ICD-10-CM

## 2011-04-21 ENCOUNTER — Encounter: Payer: Self-pay | Admitting: Unknown Physician Specialty

## 2011-04-21 ENCOUNTER — Other Ambulatory Visit: Payer: Self-pay

## 2011-04-27 ENCOUNTER — Encounter: Payer: Medicare Other | Admitting: Emergency Medicine

## 2011-04-27 ENCOUNTER — Ambulatory Visit (INDEPENDENT_AMBULATORY_CARE_PROVIDER_SITE_OTHER): Payer: Medicare Other | Admitting: Emergency Medicine

## 2011-04-27 DIAGNOSIS — Z7901 Long term (current) use of anticoagulants: Secondary | ICD-10-CM

## 2011-04-27 DIAGNOSIS — I4891 Unspecified atrial fibrillation: Secondary | ICD-10-CM

## 2011-04-27 DIAGNOSIS — I059 Rheumatic mitral valve disease, unspecified: Secondary | ICD-10-CM

## 2011-05-04 ENCOUNTER — Encounter: Payer: Medicare Other | Admitting: Emergency Medicine

## 2011-05-21 ENCOUNTER — Encounter: Payer: Self-pay | Admitting: Unknown Physician Specialty

## 2011-05-31 DIAGNOSIS — Z950 Presence of cardiac pacemaker: Secondary | ICD-10-CM | POA: Insufficient documentation

## 2011-05-31 DIAGNOSIS — I495 Sick sinus syndrome: Secondary | ICD-10-CM | POA: Insufficient documentation

## 2011-06-21 ENCOUNTER — Encounter: Payer: Self-pay | Admitting: Unknown Physician Specialty

## 2011-06-28 ENCOUNTER — Ambulatory Visit (HOSPITAL_COMMUNITY)
Admission: RE | Admit: 2011-06-28 | Discharge: 2011-06-28 | Disposition: A | Discharge status: Home / Self Care | Payer: Medicare Other | Source: Ambulatory Visit | Attending: Internal Medicine | Admitting: Internal Medicine

## 2011-06-28 VITALS — BP 92/58 | HR 62 | Wt 155.5 lb

## 2011-06-28 DIAGNOSIS — I5022 Chronic systolic (congestive) heart failure: Secondary | ICD-10-CM | POA: Insufficient documentation

## 2011-06-28 DIAGNOSIS — I4891 Unspecified atrial fibrillation: Secondary | ICD-10-CM | POA: Insufficient documentation

## 2011-06-28 MED ORDER — CANDESARTAN CILEXETIL 4 MG PO TABS: 4.0000 mg | ORAL_TABLET | Freq: Every day | ORAL | Status: DC

## 2011-06-28 NOTE — Progress Notes (Signed)
HPI:  78 y/o woman (mother of Dr. Drue Second) with h/o HTN, DM2 x 15 years, hyperlipidemia, AF and CHF due to NICM diagnosed in 06/2010.   According to previous notes had echo in 3/11 with EF >55%. no mention of MR. Echo 12/11 EF 40-45% mild LVH.   Underwent cath January 2012 for CP. Non-obstructive CAD with LAD 40-50% mild.  EF 20-25% with severe global HK.  RA 9, RV 43/7/10, PA 40/18 (30), PCWP mean 22 with V-wave of 30, central aortic pressure 117/57 with a mean of 82, LV pressure 116/14/17.  There was no aortic stenosis. Fick cardiac output was 4.1 with a cardiac index of 2.3.  Echo  07/06/10 EF 20-25% with moderate to severe MR due to dilated annulus. Cardiac MRI LV markedly dilated EF 24% Significant RV dysfunction. No hyperenhancement. No comment on MR.  Echo 4/12 here EF 40-45%  For AF underwent initiation of Tikosyn with cardioversion at [facility].   Subsequently has had reported complete recovery of LV function with medical therapy. Echo 11/22/10 at [facility] with EF 60%. +diastolic dysfunction. Severe biatrial enlargement. No significant MR.    AF recurred so Underwent AF ablation on August 13th. Tikosyn eventaully stopped. Coumadin switched to Xarelto.   Had f/u with Dr. Deno Lunger in 12/12. No intercurrent AF. Echo at [facility] on 06/06/11 - EF 35% with mild LVH and restrictive filling pattern. Mild MR. Mild RV dysfunction. Echo at her son's office in 12/28 showed EF 45% by report.   Stopped valsartan due to low BP. Dr. Regino Schultze prescribed lisinopril 2.5 daily.   Her son suggested spiro and digoxin in December 2012 but didn't tolerate.  Says she feels well. Breathing better. No orthopnea/PND or edema. Several weeks ago had severe cough. Treated empirically for URI with several rounds of antibiotics without much help. Then stopped ACE-I 3 days ago and cough resolved. Last night started Diovan 40. SBP 90s. Feels dizzy. Weight stable.    ROS: All systems negative except as listed in HPI, PMH and Problem  List.  Past Medical History  Diagnosis Date  . Chest pain   . CAD (coronary artery disease)     non-obstructive CAD on cath January 2012. mLAD 40-50%  . CHF (congestive heart failure)     due to recurrent NICM echo 06/2010: EF 20-25% mod-severe MR, moderate TR  . A-fib     failed amio due to Gi side effects (in [place])   . Hyperlipidemia   . Hypertension   . Diabetes mellitus     Current Outpatient Prescriptions  Medication Sig Dispense Refill  . calcium-vitamin D 250-100 MG-UNIT per tablet Take 1 tablet by mouth daily.        . carvedilol (COREG) 12.5 MG tablet Take 12.5 mg by mouth 2 (two) times daily with a meal.       . furosemide (LASIX) 20 MG tablet Take 20 mg by mouth daily.        Marland Kitchen glimepiride (AMARYL) 2 MG tablet Take 2 mg by mouth 2 (two) times daily.       . metFORMIN (GLUMETZA) 500 MG (MOD) 24 hr tablet 500 mg. 1 every morning        . Rivaroxaban (XARELTO) 15 MG TABS tablet Take 15 mg by mouth daily.        . rosuvastatin (CRESTOR) 10 MG tablet Take 10 mg by mouth daily.       . sitaGLIPtan (JANUVIA) 100 MG tablet Take 100 mg by mouth every morning.       Marland Kitchen  valsartan (DIOVAN) 80 MG tablet Take 40 mg by mouth daily.          PHYSICAL EXAM: Filed Vitals:   06/28/11 1106  BP: 92/58  Pulse: 62   General:  Well appearing. No resp difficulty HEENT: normal Neck: supple. JVP flat. Carotids 2+ bilaterally; no bruits. No lymphadenopathy or thryomegaly appreciated. Cor: PMI normal. Regular rate & rhythm. No rubs, gallops. Trivial systolic murmur at apex. Lungs: clear Abdomen: soft, nontender, nondistended. No hepatosplenomegaly. No bruits or masses. Good bowel sounds. Extremities: no cyanosis, clubbing, rash, edema Neuro: alert & orientedx3, cranial nerves grossly intact. Moves all 4 extremities w/o difficulty. Affect pleasant.   ECG: Sinus with v-pacing 84    ASSESSMENT & PLAN:

## 2011-06-28 NOTE — Patient Instructions (Addendum)
Stop Valsartan (Diovan)  Start Atacand 4 mg daily at bedtime  Your physician recommends that you schedule a follow-up appointment in: 3 months

## 2011-07-05 NOTE — Assessment & Plan Note (Addendum)
Overall symptomatically improved. However BP running low. We talked at lengthabout the fact that she is probably having too many people adjust her meds and this is causing problems. Has not been able to tolerate Diovan well but her son has also added digoxin and spiro. I asked them to stop spiro and digoxin and we will try candesartan 4mg  daily. I called Dr. Regino Schultze (whom she is seeing tomorrow) and told him of the change. Time spent 50 minutes.

## 2011-07-05 NOTE — Assessment & Plan Note (Signed)
Maintaining SR after ablation. Followed br Dr. Deno Lunger.

## 2011-07-14 ENCOUNTER — Encounter (HOSPITAL_COMMUNITY): Payer: Medicare Other

## 2011-07-26 ENCOUNTER — Ambulatory Visit: Payer: Self-pay | Admitting: Cardiology

## 2011-07-26 DIAGNOSIS — Z7901 Long term (current) use of anticoagulants: Secondary | ICD-10-CM

## 2011-07-26 DIAGNOSIS — I4891 Unspecified atrial fibrillation: Secondary | ICD-10-CM

## 2011-07-26 DIAGNOSIS — I059 Rheumatic mitral valve disease, unspecified: Secondary | ICD-10-CM

## 2013-07-05 LAB — LIPID PANEL: LDL Cholesterol: 123 mg/dL

## 2013-07-05 LAB — HEMOGLOBIN A1C: HEMOGLOBIN A1C: 8 % — AB (ref 4.0–6.0)

## 2013-07-10 ENCOUNTER — Encounter: Payer: Self-pay | Admitting: Endocrinology

## 2013-07-10 ENCOUNTER — Ambulatory Visit (INDEPENDENT_AMBULATORY_CARE_PROVIDER_SITE_OTHER): Payer: Medicare Other | Admitting: Endocrinology

## 2013-07-10 VITALS — BP 104/56 | HR 75 | Temp 98.3°F | Resp 12 | Ht 60.0 in | Wt 154.2 lb

## 2013-07-10 DIAGNOSIS — R252 Cramp and spasm: Secondary | ICD-10-CM

## 2013-07-10 DIAGNOSIS — N183 Chronic kidney disease, stage 3 unspecified: Secondary | ICD-10-CM

## 2013-07-10 DIAGNOSIS — E1165 Type 2 diabetes mellitus with hyperglycemia: Principal | ICD-10-CM

## 2013-07-10 DIAGNOSIS — E7800 Pure hypercholesterolemia, unspecified: Secondary | ICD-10-CM

## 2013-07-10 DIAGNOSIS — IMO0001 Reserved for inherently not codable concepts without codable children: Secondary | ICD-10-CM

## 2013-07-10 NOTE — Patient Instructions (Addendum)
Please check blood sugars at least half the time about 2 hours after any meal especially dinner and as directed on waking up.   Please bring blood sugar monitor to each visit  Walk daily  Metformin 2 in pm and 1 in am  Magnesium (Magox) for cramps

## 2013-07-10 NOTE — Progress Notes (Signed)
Patient ID: April Park, female   DOB: [birth year], 80 y.o.   MRN: [id number]   Reason for Appointment: Diabetes follow-up   History of Present Illness   Diagnosis: Type 2 DIABETES MELITUS, date of diagnosis:  1998     Previous history: April Park has been on metformin and subsequently Amaryl as initial treatments with usually fair control In 2011 because of relatively high readings Januvia was added Her last visit was in 07/2010 and even though her A1c was 7.6 April Park did not appear to have significant high blood sugars at home  Recent history: April Park has not been checking her blood sugars recently and there are only 3 recent readings. April Park is using expired test strips on her One Touch monitor April Park [place] However has not been able to lose weight April Park had lab work done recently in preparation for cataract surgery and A1c was high at 8%  April Park has been taking all her medications quite regularly including Amaryl and metformin twice a day Previously her metformin dose had been limited because of borderline renal function     Oral hypoglycemic drugs:Amaryl, Januvia, metformin         Side effects from medications: None Proper timing of medications in relation to meals: Yes.          Monitors blood glucose: Once a day.    Glucometer: One Touch.          Blood Glucose readings from recall: readings before breakfast:163, 143, p.c. lunch 117 today Hypoglycemia frequency: Rare, had a glucose of 58 a couple months ago when April Park missed her meal           Meals: 3 meals per day. At breakfast April Park usually has 2 slices of bread or a fried roti,  often no protein. 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  Always use your most recent med list.               calcium-vitamin D 250-100 MG-UNIT per tablet  Take 1 tablet by mouth daily.     candesartan 4 MG tablet  Commonly known as:  ATACAND  Take 1 tablet (4 mg total) by mouth at bedtime.     carvedilol 25 MG tablet  Commonly known as:  COREG  Take 25 mg by mouth 2 (two) times daily with a meal.     esomeprazole 40 MG capsule  Commonly known as:  NEXIUM  Take 40 mg by mouth as needed.     furosemide 20 MG tablet  Commonly known as:  LASIX  Take 20 mg  by mouth daily.     glimepiride 2 MG tablet  Commonly known as:  AMARYL  Take 2 mg by mouth 2 (two) times daily.     metFORMIN 500 MG (MOD) 24 hr tablet  Commonly known as:  GLUMETZA  - Take 500 mg by mouth 2 (two) times daily with a meal. 1 every morning   -      Rivaroxaban 15 MG Tabs tablet  Commonly known as:  XARELTO  Take 15 mg by mouth daily.     sitaGLIPtin 100 MG tablet  Commonly known as:  JANUVIA  Take 100 mg by mouth every morning.     spironolactone 25 MG tablet  Commonly known as:  ALDACTONE  Take 25 mg by mouth daily.     valsartan 160 MG tablet  Commonly known as:  DIOVAN  Take 160 mg by mouth daily.     Vitamin D3 1000 UNITS Caps  Take 1,000 Units by mouth.        Allergies:  Allergies  Allergen Reactions  . Ace Inhibitors Cough    Past Medical History  Diagnosis Date  . Chest pain   . CAD (coronary artery disease)     non-obstructive CAD on cath January 2012. mLAD 40-50%  . CHF (congestive heart failure)     due to recurrent NICM echo 06/2010: EF 20-25% mod-severe MR, moderate TR  .  A-fib     failed amio due to Gi side effects (in [place])   . Hyperlipidemia   . Hypertension   . Diabetes mellitus     Past Surgical History  Procedure Laterality Date  . Knee arthroscopy      2009  . Cardiac catheterization      Family History  Problem Relation Age of Onset  . Arrhythmia Mother 12  . Heart failure Mother     diastolic dysfunction  . Diabetes Sister     family history  . Hypertension      family history    Social History:  reports that April Park has never smoked. April Park does not have any smokeless tobacco history on file. April Park reports that April Park does not drink alcohol or use illicit drugs.  Review of Systems:  History of atrial fibrillation and chronic systolic heart failure. Last ejection fraction was 58%  Recent problems with cataracts, is going to have surgery soon  Hypertension: well controlled with multiple medications, followed by cardiologist    Lipids: April Park was having muscle cramps and stopped taking her Crestor thinking it was causing muscle cramps. Recent LDL is 123 with HDL 62 and triglycerides 60  April Park is having muscle cramps especially at night even with stopping Crestor     LABS:  Recent creatinine 1.3, A1c 8.0 on 07/05/2013   Examination:   BP 104/56  Pulse 75  Temp(Src) 98.3 F (36.8 C)  Resp 12  Ht 5' (1.524 m)  Wt 154 lb 3.2 oz (69.945 kg)  BMI 30.12 kg/m2  SpO2 98%  Body mass index is 30.12 kg/(m^2).   No ankle edema Foot exam done today  ASSESSMENT/ PLAN::    Diabetes type 2:  Blood glucose control appears inadequate because of her high A1c. However historically her A1c has been relatively high without documented significant hyperglycemia April Park is still quite irregular with checking her blood sugars and her monitor has only readings for the last 2 days Since April Park is usually living [place] her [place] will be difficult to review some of the newer medications especially  injectables Also for her age, duration of diabetes and multiple  medical problems do not think we need to have her A1c at 7%  Although April Park is generally active and watching her diet discussed need to modify her diet with some carbohydrate restriction as well as adding protein consistently to breakfast Also April Park should walk regularly For now will increase her metformin in the evening to 1000 mg, this is generally safe considering her minimally increased creatinine of 1.3; may continue this as long as GFR is at least over 35 April Park will check blood sugars more regularly and use new test strips. April Park does need to check at least half of her reading about 2 hours after meals. No contraindication to cataract surgery  Complications: No evidence of significant neuropathy, no history of retinopathy and recent microalbumin appears normal  Hyperlipidemia: Discussed with her son that April Park can restart Crestor daily  Muscle cramps: April Park can try magnesium supplements especially since April Park is taking diuretics but also can try tonic water at bedtime  Followup labs in 2 months: April Park can have repeat A1c along with fructosamine to assess her control  Vastie Douty 07/10/2013, 4:40 PM

## 2013-07-11 ENCOUNTER — Encounter: Payer: Self-pay | Admitting: Endocrinology

## 2013-07-11 DIAGNOSIS — E7800 Pure hypercholesterolemia, unspecified: Secondary | ICD-10-CM | POA: Insufficient documentation

## 2013-07-11 DIAGNOSIS — N183 Chronic kidney disease, stage 3 unspecified: Secondary | ICD-10-CM | POA: Insufficient documentation

## 2013-07-11 DIAGNOSIS — E119 Type 2 diabetes mellitus without complications: Secondary | ICD-10-CM | POA: Insufficient documentation

## 2013-07-15 ENCOUNTER — Ambulatory Visit: Payer: Self-pay | Admitting: Ophthalmology

## 2013-07-17 ENCOUNTER — Ambulatory Visit: Payer: Medicare Other | Admitting: Endocrinology

## 2013-09-11 ENCOUNTER — Encounter: Payer: Self-pay | Admitting: Endocrinology

## 2013-09-11 ENCOUNTER — Other Ambulatory Visit: Payer: Self-pay | Admitting: *Deleted

## 2013-09-11 ENCOUNTER — Ambulatory Visit (INDEPENDENT_AMBULATORY_CARE_PROVIDER_SITE_OTHER): Payer: Medicare Other | Admitting: Endocrinology

## 2013-09-11 VITALS — BP 110/60 | HR 70 | Temp 97.9°F | Resp 16 | Ht 60.0 in | Wt 154.6 lb

## 2013-09-11 DIAGNOSIS — E1165 Type 2 diabetes mellitus with hyperglycemia: Principal | ICD-10-CM

## 2013-09-11 DIAGNOSIS — E7800 Pure hypercholesterolemia, unspecified: Secondary | ICD-10-CM

## 2013-09-11 DIAGNOSIS — IMO0001 Reserved for inherently not codable concepts without codable children: Secondary | ICD-10-CM

## 2013-09-11 DIAGNOSIS — E785 Hyperlipidemia, unspecified: Secondary | ICD-10-CM

## 2013-09-11 MED ORDER — GLUCOSE BLOOD VI STRP: ORAL_STRIP | Status: DC

## 2013-09-11 NOTE — Patient Instructions (Addendum)
Stop AM Glimeperide and keep pm dose same  Please check blood sugars at least half the time about 2 hours after any meal and every 2 days on waking up. Please bring blood sugar monitor to each visit

## 2013-09-11 NOTE — Progress Notes (Signed)
Patient ID: April Park, female   DOB: 1931-07-03, 80 y.o.   MRN: [id number]   Reason for Appointment: Diabetes follow-up   History of Present Illness   Diagnosis: Type 2 DIABETES MELITUS, date of diagnosis:  1998     Previous history: She has been on metformin and subsequently Amaryl as initial treatments with usually fair control In 2011 because of relatively high readings Januvia was added Her last visit was in 07/2010 and even though her A1c was 7.6 she did not appear to have significant high blood sugars at home  Recent history: She was seen in 1/15 for followup after a hiatus of 3 years Because of her A1c of 8% her metformin was increased to 1500 mg. Previously her metformin dose had been limited because of borderline renal function. She had no side effects from metformin and no fatigue or lethargy. She was continued on Januvia and Amaryl also She has been checking her blood sugars more frequently recently; previously was using expired test strips on her One Touch monitor However she is checking blood sugars mostly in the morning and these appear to be excellent No recent labs available Also has not been doing any exercise because of not being over to go out on her own She does report occasional symptoms of hypoglycemia in the afternoon and has only one documented low reading of 54 at 2:30 p.m. Not checking any readings after dinner     Oral hypoglycemic drugs:Amaryl, Januvia, metformin         Side effects from medications: None Proper timing of medications in relation to meals: Yes.          Monitors blood glucose: Once a day.    Glucometer:  Accu-Chek         Blood Glucose readings from download: Glucose before breakfast: 71-147 with average 110, midday average 103, early afternoon averaging 94 Overall average 104 with 26 readings and overall range 54-156  Hypoglycemia: As above, only one low reading on her 30 day download         Meals: 3 meals per day. At breakfast she  usually has 2 slices of bread or a fried roti,  often no protein. 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  Always use your most recent med list.               calcium-vitamin D 250-100 MG-UNIT per tablet  Take 1 tablet by mouth daily.     candesartan 4 MG tablet  Commonly known as:  ATACAND  Take 1 tablet (4 mg total) by mouth at bedtime.  carvedilol 25 MG tablet  Commonly known as:  COREG  Take 25 mg by mouth 2 (two) times daily with a meal.     esomeprazole 40 MG capsule  Commonly known as:  NEXIUM  Take 40 mg by mouth as needed.     furosemide 20 MG tablet  Commonly known as:  LASIX  Take 20 mg by mouth daily.     glimepiride 2 MG tablet  Commonly known as:  AMARYL  Take 2 mg by mouth 2 (two) times daily.     glucose blood test strip  Commonly known as:  ACCU-CHEK AVIVA PLUS  Use as instructed to check blood sugar 2 times per day dx code 250.02     metFORMIN 500 MG (MOD) 24 hr tablet  Commonly known as:  GLUMETZA  Take 500 mg by mouth 2 (two) times daily with a meal. 1tablet every morning 2 tablets at night     Rivaroxaban 15 MG Tabs tablet  Commonly known as:  XARELTO  Take 15 mg by mouth daily.     sitaGLIPtin 100 MG tablet  Commonly known as:  JANUVIA  Take 100 mg by mouth every morning.     spironolactone 25 MG tablet   Commonly known as:  ALDACTONE  Take 25 mg by mouth daily.     valsartan 160 MG tablet  Commonly known as:  DIOVAN  Take 160 mg by mouth daily.     Vitamin D3 1000 UNITS Caps  Take 1,000 Units by mouth.        Allergies:  Allergies  Allergen Reactions  . Ace Inhibitors Cough    Past Medical History  Diagnosis Date  . Chest pain   . CAD (coronary artery disease)     non-obstructive CAD on cath January 2012. mLAD 40-50%  . CHF (congestive heart failure)     due to recurrent NICM echo 06/2010: EF 20-25% mod-severe MR, moderate TR  . A-fib     failed amio due to Gi side effects (in [place])   . Hyperlipidemia   . Hypertension   . Diabetes mellitus     Past Surgical History  Procedure Laterality Date  . Knee arthroscopy      2009  . Cardiac catheterization      Family History  Problem Relation Age of Onset  . Arrhythmia Mother 31  . Heart failure Mother     diastolic dysfunction  . Diabetes Sister     family history  . Hypertension      family history    Social History:  reports that she has never smoked. She does not have any smokeless tobacco history on file. She reports that she does not drink alcohol or use illicit drugs.  Review of Systems:  History of atrial fibrillation and chronic systolic heart failure. Last ejection fraction was 58%  Has had cataract surgery  Hypertension: well controlled with multiple medications, followed by cardiologist    Lipids: She was asked to resume her Crestor on her last visit. Previous LDL was 123 with HDL 62 and triglycerides 60  She had been complaining of muscle cramps especially at night     Diabetic Foot exam done in 1/15  LABS:  Pending   Examination:   BP 110/60  Pulse 70  Temp(Src) 97.9 F (36.6 C)  Resp 16  Ht 5' (1.524 m)  Wt 154 lb 9.6 oz (70.126 kg)  BMI 30.19 kg/m2  SpO2 96%  Body mass index is 30.19 kg/(m^2).  No ankle edema   ASSESSMENT/ PLAN:    Diabetes type 2:  Blood glucose  control appears excellent now with increasing her metformin to a total of 1500 mg a day Also she is having tendency to mild hypoglycemia in the afternoon Not checking readings after her evening meal and mostly in the morning  Encouraged her to check readings after dinner at times and not necessarily in the morning She can stop her morning Amaryl to avoid daytime hypoglycemia To continue same doses of metformin as well as renal function is stable  A1c to be checked today She will followup in 2-3 months depending on her travel plans  Hypercholesterolemia: To be followed by PCP and cardiologist  [facility] 09/11/2013, 4:08 PM   Office Visit on 09/11/2013  Component Date Value Ref Range Status  . Hemoglobin A1C 09/11/2013 7.4* 4.6 - 6.5 % Final   Glycemic Control Guidelines for People with Diabetes:Non Diabetic:  <6%Goal of Therapy: <7%Additional Action Suggested:  >8%   . Sodium 09/11/2013 134* 135 - 145 mEq/L Final  . Potassium 09/11/2013 4.5  3.5 - 5.1 mEq/L Final  . Chloride 09/11/2013 103  96 - 112 mEq/L Final  . CO2 09/11/2013 25  19 - 32 mEq/L Final  . Glucose, Bld 09/11/2013 140* 70 - 99 mg/dL Final  . BUN 09/11/2013 25* 6 - 23 mg/dL Final  . Creatinine, Ser 09/11/2013 1.2  0.4 - 1.2 mg/dL Final  . Total Bilirubin 09/11/2013 0.4  0.3 - 1.2 mg/dL Final  . Alkaline Phosphatase 09/11/2013 30* 39 - 117 U/L Final  . AST 09/11/2013 24  0 - 37 U/L Final  . ALT 09/11/2013 17  0 - 35 U/L Final  . Total Protein 09/11/2013 7.7  6.0 - 8.3 g/dL Final  . Albumin 09/11/2013 4.3  3.5 - 5.2 g/dL Final  . Calcium 09/11/2013 9.4  8.4 - 10.5 mg/dL Final  . GFR 09/11/2013 46.44* >60.00 mL/min Final

## 2013-09-12 LAB — COMPREHENSIVE METABOLIC PANEL
ALBUMIN: 4.3 g/dL (ref 3.5–5.2)
ALT: 17 U/L (ref 0–35)
AST: 24 U/L (ref 0–37)
Alkaline Phosphatase: 30 U/L — ABNORMAL LOW (ref 39–117)
BILIRUBIN TOTAL: 0.4 mg/dL (ref 0.3–1.2)
BUN: 25 mg/dL — ABNORMAL HIGH (ref 6–23)
CO2: 25 mEq/L (ref 19–32)
Calcium: 9.4 mg/dL (ref 8.4–10.5)
Chloride: 103 mEq/L (ref 96–112)
Creatinine, Ser: 1.2 mg/dL (ref 0.4–1.2)
GFR: 46.44 mL/min — ABNORMAL LOW (ref >60.00)
GLUCOSE: 140 mg/dL — AB (ref 70–99)
POTASSIUM: 4.5 meq/L (ref 3.5–5.1)
Sodium: 134 mEq/L — ABNORMAL LOW (ref 135–145)
Total Protein: 7.7 g/dL (ref 6.0–8.3)

## 2013-09-12 LAB — HEMOGLOBIN A1C: Hgb A1c MFr Bld: 7.4 % — ABNORMAL HIGH (ref 4.6–6.5)

## 2013-12-04 ENCOUNTER — Ambulatory Visit: Payer: Medicare Other | Admitting: Endocrinology

## 2013-12-04 DIAGNOSIS — Z0289 Encounter for other administrative examinations: Secondary | ICD-10-CM

## 2014-10-11 NOTE — Op Note (Signed)
PATIENT NAME:  April Park, April Park MR#:  [id number] DATE OF BIRTH:  1931/10/08  DATE OF PROCEDURE:  07/15/2013  PREOPERATIVE DIAGNOSIS: Cataract, left eye.   POSTOPERATIVE DIAGNOSIS: Cataract, left eye.   PROCEDURE PERFORMED: Extracapsular cataract extraction using phacoemulsification and use of Vision Blue and placement of an Alcon [id number] 22.5-diopter posterior chamber lens, serial number [id number].   SURGEON: Loura Back. Ryliee Figge, M.D.   ANESTHESIA: 4% lidocaine and 0.75% Marcaine in a 50-50 mixture with 10 units/mL of Hylenex added, given as a peribulbar.   ANESTHESIOLOGIST: Dr. Carolynn Comment   COMPLICATIONS: None.   ESTIMATED BLOOD LOSS: Less than 1 mL.   DESCRIPTION OF PROCEDURE: The patient was brought to the operating room and given IV sedation and a peribulbar block. She was then prepped and draped in the usual fashion. The vertical rectus muscles were imbricated using 5-0 silk sutures as bridle sutures. Cautery was used to blanch the vessels at the superior limbus and a partial thickness scleral groove was made in the posterior surgical limbus. This was dissected anteriorly into clear cornea with an Alcon crescent knife. The anterior chamber was entered superonasally and superotemporally through clear cornea with a paracentesis knife. Air was used to replace the aqueous and the front of the capsule was painted with Vision Blue. DisCoVisc was used to replace the air and the anterior chamber was entered through a lamellar dissection with a 2.6 mm keratome. A circular capsulorrhexis was begun but with a small amount linear cut an [place] flag rupture of the anterior capsule tearing out to the periphery occurred. The rest of the capsule was opened using a can opener technique above and below. Phacoemulsification was then carried out in a divide and conquer technique. The lens was noted to be 4+ hard. Ultrasound time was 3 minutes and 20 seconds with an average power of 26% and a CDE 90.57. Care was  taken at that time to try to not place any pressure on the capsular bag. Irrigation/aspiration was used to remove the very little residual cortex that was remaining. A decision was made not to try to go too blindly into the corners to try to get peripheral cortex because of the precarious nature of the bag. The capsular bag was inflated with DisCoVisc and the intraocular lens was inserted in the capsular bag vertically 90 degrees away from the anterior tear using a [employer]. Irrigation-aspiration was used to remove the residual DisCoVisc. The wound was inflated with balanced salt and a single 10-0 nylon suture was placed across the wound. The knot was rotated forward and buried. Miostat was injected through the paracentesis track and a tenth of a milliliter of cefuroxime containing 1 mg of drug was injected there as well. The wound was checked for leaks and none were found. The bridle sutures were removed and 3 drops of Vigamox was placed on the eye. A shield was placed on the eye. The patient was given 500 mg of Diamox and a drop of 0.5% Timoptic was placed on the eye to keep the pressure down. A shield was placed on the eye and the patient was discharged to the recovery room in good condition.  ____________________________ Loura Back Rodrick Payson, MD sad:sb D: 07/15/2013 13:25:36 ET T: 07/15/2013 15:11:24 ET JOB#: [id number]  cc: Remo Lipps A. Shacola Schussler, MD, <Dictator> Martie Lee MD ELECTRONICALLY SIGNED 07/22/2013 12:55

## 2016-08-04 ENCOUNTER — Other Ambulatory Visit: Payer: Self-pay | Admitting: Internal Medicine

## 2016-08-04 DIAGNOSIS — R1111 Vomiting without nausea: Secondary | ICD-10-CM

## 2016-08-05 ENCOUNTER — Ambulatory Visit
Admission: RE | Admit: 2016-08-05 | Discharge: 2016-08-05 | Disposition: A | Discharge status: Home / Self Care | Payer: Medicare Other | Source: Ambulatory Visit | Attending: Internal Medicine | Admitting: Internal Medicine

## 2016-08-05 DIAGNOSIS — R1111 Vomiting without nausea: Secondary | ICD-10-CM | POA: Diagnosis not present

## 2016-08-09 DIAGNOSIS — D696 Thrombocytopenia, unspecified: Secondary | ICD-10-CM | POA: Insufficient documentation

## 2016-08-11 DIAGNOSIS — Z9889 Other specified postprocedural states: Secondary | ICD-10-CM

## 2016-08-11 DIAGNOSIS — Z8679 Personal history of other diseases of the circulatory system: Secondary | ICD-10-CM | POA: Insufficient documentation

## 2016-08-11 DIAGNOSIS — R1031 Right lower quadrant pain: Secondary | ICD-10-CM | POA: Insufficient documentation

## 2016-10-18 ENCOUNTER — Inpatient Hospital Stay
Admission: AD | Admit: 2016-10-18 | Discharge: 2016-10-20 | DRG: 378 | Disposition: A | Discharge status: Home Health | Payer: Medicare Other | Source: Ambulatory Visit | Attending: Internal Medicine | Admitting: Internal Medicine

## 2016-10-18 DIAGNOSIS — K2970 Gastritis, unspecified, without bleeding: Secondary | ICD-10-CM | POA: Diagnosis present

## 2016-10-18 DIAGNOSIS — I959 Hypotension, unspecified: Secondary | ICD-10-CM | POA: Diagnosis present

## 2016-10-18 DIAGNOSIS — K31811 Angiodysplasia of stomach and duodenum with bleeding: Principal | ICD-10-CM | POA: Diagnosis present

## 2016-10-18 DIAGNOSIS — D62 Acute posthemorrhagic anemia: Secondary | ICD-10-CM | POA: Diagnosis present

## 2016-10-18 DIAGNOSIS — I4891 Unspecified atrial fibrillation: Secondary | ICD-10-CM | POA: Diagnosis present

## 2016-10-18 DIAGNOSIS — Z833 Family history of diabetes mellitus: Secondary | ICD-10-CM

## 2016-10-18 DIAGNOSIS — E785 Hyperlipidemia, unspecified: Secondary | ICD-10-CM | POA: Diagnosis present

## 2016-10-18 DIAGNOSIS — K922 Gastrointestinal hemorrhage, unspecified: Secondary | ICD-10-CM | POA: Diagnosis present

## 2016-10-18 DIAGNOSIS — R252 Cramp and spasm: Secondary | ICD-10-CM

## 2016-10-18 DIAGNOSIS — I429 Cardiomyopathy, unspecified: Secondary | ICD-10-CM | POA: Diagnosis present

## 2016-10-18 DIAGNOSIS — Z7901 Long term (current) use of anticoagulants: Secondary | ICD-10-CM | POA: Diagnosis not present

## 2016-10-18 DIAGNOSIS — K921 Melena: Secondary | ICD-10-CM | POA: Diagnosis present

## 2016-10-18 DIAGNOSIS — Z8249 Family history of ischemic heart disease and other diseases of the circulatory system: Secondary | ICD-10-CM

## 2016-10-18 DIAGNOSIS — Z9889 Other specified postprocedural states: Secondary | ICD-10-CM

## 2016-10-18 DIAGNOSIS — E119 Type 2 diabetes mellitus without complications: Secondary | ICD-10-CM | POA: Diagnosis present

## 2016-10-18 DIAGNOSIS — I10 Essential (primary) hypertension: Secondary | ICD-10-CM | POA: Diagnosis present

## 2016-10-18 DIAGNOSIS — I2510 Atherosclerotic heart disease of native coronary artery without angina pectoris: Secondary | ICD-10-CM | POA: Diagnosis present

## 2016-10-18 DIAGNOSIS — D509 Iron deficiency anemia, unspecified: Secondary | ICD-10-CM | POA: Diagnosis present

## 2016-10-18 LAB — IRON AND TIBC
IRON: 18 ug/dL — AB (ref 28–170)
SATURATION RATIOS: 5 % — AB (ref 10.4–31.8)
TIBC: 355 ug/dL (ref 250–450)
UIBC: 337 ug/dL

## 2016-10-18 LAB — GLUCOSE, CAPILLARY
Glucose-Capillary: 93 mg/dL (ref 65–99)
Glucose-Capillary: 103 mg/dL — ABNORMAL HIGH (ref 65–99)

## 2016-10-18 LAB — ABO/RH: ABO/RH(D): A POS

## 2016-10-18 LAB — FOLATE: Folate: 29 ng/mL (ref >5.9)

## 2016-10-18 LAB — PREPARE RBC (CROSSMATCH)

## 2016-10-18 LAB — VITAMIN B12: Vitamin B-12: 359 pg/mL (ref 180–914)

## 2016-10-18 LAB — FERRITIN: Ferritin: 12 ng/mL (ref 11–307)

## 2016-10-18 MED ORDER — ONDANSETRON HCL 4 MG/2ML IJ SOLN: 4.0000 mg | Freq: Four times a day (QID) | INTRAMUSCULAR | Status: DC | PRN

## 2016-10-18 MED ORDER — SODIUM CHLORIDE 0.9 % IV SOLN
250.0000 mL | INTRAVENOUS | Status: DC | PRN
  Administered 2016-10-20: 08:00:00 via INTRAVENOUS

## 2016-10-18 MED ORDER — POLYETHYLENE GLYCOL 3350 17 G PO PACK: 17.0000 g | PACK | Freq: Every day | ORAL | Status: DC | PRN

## 2016-10-18 MED ORDER — DOFETILIDE 125 MCG PO CAPS
125.0000 ug | ORAL_CAPSULE | Freq: Two times a day (BID) | ORAL | Status: DC
  Administered 2016-10-18 – 2016-10-20 (×4): 125 ug via ORAL
  Filled 2016-10-18 (×4): qty 1

## 2016-10-18 MED ORDER — PANTOPRAZOLE SODIUM 40 MG IV SOLR
40.0000 mg | Freq: Two times a day (BID) | INTRAVENOUS | Status: DC
  Administered 2016-10-18 – 2016-10-20 (×4): 40 mg via INTRAVENOUS
  Filled 2016-10-18 (×4): qty 40

## 2016-10-18 MED ORDER — ACETAMINOPHEN 650 MG RE SUPP: 650.0000 mg | Freq: Four times a day (QID) | RECTAL | Status: DC | PRN

## 2016-10-18 MED ORDER — ONDANSETRON HCL 4 MG PO TABS: 4.0000 mg | ORAL_TABLET | Freq: Four times a day (QID) | ORAL | Status: DC | PRN

## 2016-10-18 MED ORDER — SODIUM CHLORIDE 0.9% FLUSH
3.0000 mL | Freq: Two times a day (BID) | INTRAVENOUS | Status: DC
Start: 2016-10-18 — End: 2016-10-20
  Administered 2016-10-18 – 2016-10-20 (×4): 3 mL via INTRAVENOUS

## 2016-10-18 MED ORDER — ALBUTEROL SULFATE (2.5 MG/3ML) 0.083% IN NEBU
2.5000 mg | INHALATION_SOLUTION | RESPIRATORY_TRACT | Status: DC | PRN
Start: 2016-10-18 — End: 2016-10-20

## 2016-10-18 MED ORDER — ACETAMINOPHEN 325 MG PO TABS
650.0000 mg | ORAL_TABLET | Freq: Four times a day (QID) | ORAL | Status: DC | PRN
  Administered 2016-10-19 – 2016-10-20 (×3): 650 mg via ORAL
  Filled 2016-10-18 (×4): qty 2

## 2016-10-18 MED ORDER — SODIUM CHLORIDE 0.9% FLUSH: 3.0000 mL | INTRAVENOUS | Status: DC | PRN

## 2016-10-18 MED ORDER — SODIUM CHLORIDE 0.9 % IV SOLN: Freq: Once | INTRAVENOUS | Status: DC

## 2016-10-18 MED ORDER — INSULIN ASPART 100 UNIT/ML ~~LOC~~ SOLN
0.0000 [IU] | Freq: Three times a day (TID) | SUBCUTANEOUS | Status: DC
  Administered 2016-10-19 – 2016-10-20 (×2): 2 [IU] via SUBCUTANEOUS
  Administered 2016-10-20: 1 [IU] via SUBCUTANEOUS
  Filled 2016-10-18: qty 1
  Filled 2016-10-18 (×2): qty 2

## 2016-10-18 NOTE — Consult Note (Signed)
[name] MD  [address]. [place], [place] [address] Phone: [phone] Fax : [phone]  Consultation  Referring Provider:    Dr Darvin Neighbours Primary Care Physician:  Volanda Napoleon, MD Primary Gastroenterologist:  None          Reason for Consultation:     Anemia  Date of Admission:  10/18/2016 Date of Consultation:  10/18/2016         HPI:   April Park is a 83 y.o. female whom I have been asked to see for symptomatic anemia. She has a history of atrial fibrillation. S/p ablation , pacemaker. She is on Xarelto , last dose this morning . She was admitted when she was found to be anemic when being evaluated for a low blood pressure. She was admitted with a Hb of around 6 grams and an elevated MCV. I do not have any access to those labs on [facility] at this time.   Hb in 07/2016 was 9.4 grams with MCV of 89   She says she has noticed tarry black stools last few weeks, denies any NSAID use. Denies any abdominal pain, or change in bowel movements. Last colonoscopy she had a few years back was normal. She also underwent an EGD a few years back which found esophagitis which was attributed to bisphosphonate use and was d/c. She does have a history of heartburn. Presently not in any pain or distress. She does complain of a lot of fatigue.   Past Medical History:  Diagnosis Date  . A-fib ([place])    failed amio due to Gi side effects (in [place])   . CAD (coronary artery disease)    non-obstructive CAD on cath January 2012. mLAD 40-50%  . Chest pain   . CHF (congestive heart failure) ([place])    due to recurrent NICM echo 06/2010: EF 20-25% mod-severe MR, moderate TR  . Diabetes mellitus   . Hyperlipidemia   . Hypertension     Past Surgical History:  Procedure Laterality Date  . CARDIAC CATHETERIZATION    . KNEE ARTHROSCOPY     2009    Prior to Admission medications   Medication Sig Start Date End Date Taking? Authorizing Provider  calcium-vitamin D 250-100 MG-UNIT per tablet Take 1  tablet by mouth daily.      Historical Provider, MD  candesartan (ATACAND) 4 MG tablet Take 1 tablet (4 mg total) by mouth at bedtime. 06/28/11 06/27/12  Jolaine Artist, MD  carvedilol (COREG) 25 MG tablet Take 25 mg by mouth 2 (two) times daily with a meal.    Historical Provider, MD  Cholecalciferol (VITAMIN D3) 1000 UNITS CAPS Take 1,000 Units by mouth.    Historical Provider, MD  esomeprazole (NEXIUM) 40 MG capsule Take 40 mg by mouth as needed.    Historical Provider, MD  furosemide (LASIX) 20 MG tablet Take 20 mg by mouth daily.      Historical Provider, MD  glimepiride (AMARYL) 2 MG tablet Take 2 mg by mouth 2 (two) times daily.     Historical Provider, MD  glucose blood (ACCU-CHEK AVIVA PLUS) test strip Use as instructed to check blood sugar 2 times per day dx code 250.02 09/11/13   Elayne Snare, MD  metFORMIN (GLUMETZA) 500 MG (MOD) 24 hr tablet Take 500 mg by mouth 2 (two) times daily with a meal. 1tablet every morning 2 tablets at night    Historical Provider, MD  Rivaroxaban (XARELTO) 15 MG TABS tablet Take 15 mg by mouth daily.  Historical Provider, MD  sitaGLIPtan (JANUVIA) 100 MG tablet Take 100 mg by mouth every morning.     Historical Provider, MD  spironolactone (ALDACTONE) 25 MG tablet Take 25 mg by mouth daily.    Historical Provider, MD  valsartan (DIOVAN) 160 MG tablet Take 160 mg by mouth daily.    Historical Provider, MD    Family History  Problem Relation Age of Onset  . Arrhythmia Mother 21  . Heart failure Mother     diastolic dysfunction  . Diabetes Sister     family history  . Hypertension      family history     Social History  Substance Use Topics  . Smoking status: Never Smoker  . Smokeless tobacco: Never Used  . Alcohol use No    Allergies as of 10/18/2016 - Review Complete 10/18/2016  Allergen Reaction Noted  . Ace inhibitors Cough 06/28/2011    Review of Systems:    All systems reviewed and negative except where noted in HPI.   Physical  Exam:  Vital signs in last 24 hours: Temp:  [98.4 F (36.9 C)] 98.4 F (36.9 C) (05/01 1702) Pulse Rate:  [80] 80 (05/01 1702) Resp:  [14] 14 (05/01 1702) BP: (112)/(54) 112/54 (05/01 1702) SpO2:  [100 %] 100 % (05/01 1702) Last BM Date: 10/18/16 General:   Pleasant, cooperative in NAD Head:  Normocephalic and atraumatic. Eyes:   No icterus.   Conjunctiva pink. PERRLA. Ears:  Normal auditory acuity. Neck:  Supple; no masses or thyroidomegaly Lungs: Respirations even and unlabored. Lungs clear to auscultation bilaterally.   No wheezes, crackles, or rhonchi.  Heart:  Regular rate and rhythm;  Without murmur, clicks, rubs or gallops Abdomen:  Soft, nondistended, nontender. Normal bowel sounds. No appreciable masses or hepatomegaly.  No rebound or guarding.  Rectal:  Not performed. Neurologic:  Alert and oriented x3;  grossly normal neurologically. Skin:  Intact without significant lesions or rashes. Cervical Nodes:  No significant cervical adenopathy. Psych:  Alert and cooperative. Normal affect.  LAB RESULTS: No results for input(s): WBC, HGB, HCT, PLT in the last 72 hours. BMET No results for input(s): NA, K, CL, CO2, GLUCOSE, BUN, CREATININE, CALCIUM in the last 72 hours. LFT No results for input(s): PROT, ALBUMIN, AST, ALT, ALKPHOS, BILITOT, BILIDIR, IBILI in the last 72 hours. PT/INR No results for input(s): LABPROT, INR in the last 72 hours.  STUDIES: No results found.    Impression / Plan:   April Park is a 83 y.o. y/o female with a history of Afibb on xarelto admitted with symptomatic anemia, melena . Differentials include peptic ulcer, esophagitis/gastritis.   Plan   1. Monitor CBC and transfuse as needed  2. PPI 3. H pylori stool antigen  4. EGD tentatively for Thursday morning ( 2 days off xarelto)   Thank you for involving me in the care of this patient.      LOS: 0 days   [name], MD  10/18/2016, 5:48 PM

## 2016-10-18 NOTE — H&P (Addendum)
[facility] NAME: April Park    MR#:  [id number]  DATE OF BIRTH:  1932-06-06  DATE OF ADMISSION:  10/18/2016  PRIMARY CARE PHYSICIAN: Volanda Napoleon, MD   REQUESTING/REFERRING PHYSICIAN: Dr. Elijio Miles  CHIEF COMPLAINT:  No chief complaint on file.  Anemia  HISTORY OF PRESENT ILLNESS:  April Park  is a 83 y.o. female with a known history of Non occlusive CAD, pAfib, NICMP with EF 35-40%, DN, HTN  Presents as a direct admission to the hospital from her primary care physician's office due to anemia and drop in her blood pressure. Patient's hemoglobin recently was 10.3. She has been feeling weak lately and blood pressure has been lower than normal at 100/40. Today she went to see her primary care physician and had labs drawn. Hemoglobin was found to be 6.2. She has noticed on and off melena for 2-3 months now. No bright blood in stool. No vomiting. She did have some burning in her chest when she ate some spicy food. She does take excellent.  She has history of esophagitis due to alendronate 5 years back which was found on EGD. Since then she has been on IV Reclast.  She had ablation done for atrial fibrillation a few months back and presently she is in normal sinus rhythm.  She is independent with her activities of daily living. Lives at home with her husband. Exercises every day.  She did take her Xarelto earlier today at 9 AM.  History obtained from patient. Also discussed with her son Dr. Neoma Laming.  PAST MEDICAL HISTORY:   Past Medical History:  Diagnosis Date  . A-fib ([place])    failed amio due to Gi side effects (in [place])   . CAD (coronary artery disease)    non-obstructive CAD on cath January 2012. mLAD 40-50%  . Chest pain   . CHF (congestive heart failure) ([place])    due to recurrent NICM echo 06/2010: EF 20-25% mod-severe MR, moderate TR  . Diabetes mellitus   . Hyperlipidemia   . Hypertension     PAST  SURGICAL HISTORY:   Past Surgical History:  Procedure Laterality Date  . CARDIAC CATHETERIZATION    . KNEE ARTHROSCOPY     2009    SOCIAL HISTORY:   Social History  Substance Use Topics  . Smoking status: Never Smoker  . Smokeless tobacco: Never Used  . Alcohol use No    FAMILY HISTORY:   Family History  Problem Relation Age of Onset  . Arrhythmia Mother 27  . Heart failure Mother     diastolic dysfunction  . Diabetes Sister     family history  . Hypertension      family history    DRUG ALLERGIES:   Allergies  Allergen Reactions  . Ace Inhibitors Cough    REVIEW OF SYSTEMS:   Review of Systems  Constitutional: Positive for malaise/fatigue. Negative for chills and fever.  HENT: Negative for sore throat.   Eyes: Negative for blurred vision, double vision and pain.  Respiratory: Negative for cough, hemoptysis, shortness of breath and wheezing.   Cardiovascular: Negative for chest pain, palpitations, orthopnea and leg swelling.  Gastrointestinal: Positive for heartburn and melena. Negative for abdominal pain, constipation, diarrhea and vomiting.  Genitourinary: Negative for dysuria and hematuria.  Musculoskeletal: Negative for back pain and joint pain.  Skin: Negative for rash.  Neurological: Positive for weakness. Negative for sensory change, speech change, focal weakness and  headaches.  Endo/Heme/Allergies: Does not bruise/bleed easily.  Psychiatric/Behavioral: Negative for depression. The patient is not nervous/anxious.     MEDICATIONS AT HOME:   Prior to Admission medications   Medication Sig Start Date End Date Taking? Authorizing Provider  calcium-vitamin D 250-100 MG-UNIT per tablet Take 1 tablet by mouth daily.      Historical Provider, MD  candesartan (ATACAND) 4 MG tablet Take 1 tablet (4 mg total) by mouth at bedtime. 06/28/11 06/27/12  Jolaine Artist, MD  carvedilol (COREG) 25 MG tablet Take 25 mg by mouth 2 (two) times daily with a meal.     Historical Provider, MD  Cholecalciferol (VITAMIN D3) 1000 UNITS CAPS Take 1,000 Units by mouth.    Historical Provider, MD  esomeprazole (NEXIUM) 40 MG capsule Take 40 mg by mouth as needed.    Historical Provider, MD  furosemide (LASIX) 20 MG tablet Take 20 mg by mouth daily.      Historical Provider, MD  glimepiride (AMARYL) 2 MG tablet Take 2 mg by mouth 2 (two) times daily.     Historical Provider, MD  glucose blood (ACCU-CHEK AVIVA PLUS) test strip Use as instructed to check blood sugar 2 times per day dx code 250.02 09/11/13   Elayne Snare, MD  metFORMIN (GLUMETZA) 500 MG (MOD) 24 hr tablet Take 500 mg by mouth 2 (two) times daily with a meal. 1tablet every morning 2 tablets at night    Historical Provider, MD  Rivaroxaban (XARELTO) 15 MG TABS tablet Take 15 mg by mouth daily.      Historical Provider, MD  sitaGLIPtan (JANUVIA) 100 MG tablet Take 100 mg by mouth every morning.     Historical Provider, MD  spironolactone (ALDACTONE) 25 MG tablet Take 25 mg by mouth daily.    Historical Provider, MD  valsartan (DIOVAN) 160 MG tablet Take 160 mg by mouth daily.    Historical Provider, MD     VITAL SIGNS:  Blood pressure (!) 112/54, pulse 80, temperature 98.4 F (36.9 C), temperature source Oral, resp. rate 14, SpO2 100 %.  PHYSICAL EXAMINATION:  Physical Exam  GENERAL:  83 y.o.-year-old patient lying in the bed with no acute distress.  EYES: Pupils equal, round, reactive to light and accommodation. No scleral icterus. Extraocular muscles intact.  HEENT: Head atraumatic, normocephalic. Oropharynx and nasopharynx clear. No oropharyngeal erythema, moist oral mucosa  NECK:  Supple, no jugular venous distention. No thyroid enlargement, no tenderness.  LUNGS: Normal breath sounds bilaterally, no wheezing, rales, rhonchi. No use of accessory muscles of respiration.  CARDIOVASCULAR: S1, S2 normal. No murmurs, rubs, or gallops.  ABDOMEN: Soft, nontender, nondistended. Bowel sounds present. No  organomegaly or mass.  EXTREMITIES: No pedal edema, cyanosis, or clubbing. + 2 pedal & radial pulses b/l.   NEUROLOGIC: Cranial nerves II through XII are intact. No focal Motor or sensory deficits appreciated b/l PSYCHIATRIC: The patient is alert and oriented x 3. Good affect.  SKIN: No obvious rash, lesion, or ulcer.   LABORATORY PANEL:   CBC No results for input(s): WBC, HGB, HCT, PLT in the last 168 hours. ------------------------------------------------------------------------------------------------------------------  Chemistries  No results for input(s): NA, K, CL, CO2, GLUCOSE, BUN, CREATININE, CALCIUM, MG, AST, ALT, ALKPHOS, BILITOT in the last 168 hours.  Invalid input(s): GFRCGP ------------------------------------------------------------------------------------------------------------------  Cardiac Enzymes No results for input(s): TROPONINI in the last 168 hours. ------------------------------------------------------------------------------------------------------------------  RADIOLOGY:  No results found.   IMPRESSION AND PLAN:   * Anemia due to GI bleed, likely upper. Melena and hypotension  Hold HTN meds Transfuse 2 units packed RBC To stabilize patient. Discussed with Dr. Vicente Males of GI regarding endoscopy. This will be scheduled for Thursday as patient needs to be off Xarelto for 48 hours. Monitor hemoglobin. Repeat labs in the morning. Clear liquid diet  * Nonischemic cardiomyopathy. No signs of fluid overload. Hold blood pressure medications.  * pAfib Now in NSR Continue Tikosyn Hold Xarelto  * Diabetes mellitus. Hold oral hypoglycemics. We'll start sliding scale insulin.  * DVT prophylaxis SCDs  All the records are reviewed and case discussed with ED provider. Management plans discussed with the patient, family and they are in agreement.  CODE STATUS: FULL CODE  TOTAL CC TIME TAKING CARE OF THIS PATIENT: 40 minutes.   Hillary Bow R M.D on  10/18/2016 at 5:49 PM  Between 7am to 6pm - Pager - [phone]  After 6pm go to [email or web address] - password [email or web address] [facility]  Office  [phone]  CC: Primary care physician; Volanda Napoleon, MD  Note: This dictation was prepared with Dragon dictation along with smaller phrase technology. Any transcriptional errors that result from this process are unintentional.

## 2016-10-18 NOTE — Progress Notes (Signed)
MEDICATION RELATED CONSULT NOTE - INITIAL   Pharmacy Consult for Electrolyte monitoring Indication: On dofetilide  Allergies  Allergen Reactions  . Ace Inhibitors Cough    Vital Signs: Temp: 98.4 F (36.9 C) (05/01 1702) Temp Source: Oral (05/01 1702) BP: 112/54 (05/01 1702) Pulse Rate: 80 (05/01 1702) Intake/Output from previous day: No intake/output data recorded. Intake/Output from this shift: No intake/output data recorded.  Labs: No results for input(s): WBC, HGB, HCT, PLT, APTT, CREATININE, LABCREA, CREATININE, CREAT24HRUR, MG, PHOS, ALBUMIN, PROT, ALBUMIN, AST, ALT, ALKPHOS, BILITOT, BILIDIR, IBILI in the last 72 hours. CrCl cannot be calculated (Patient's most recent lab result is older than the maximum 21 days allowed.).  Medical History: Past Medical History:  Diagnosis Date  . A-fib ([place])    failed amio due to Gi side effects (in [place])   . CAD (coronary artery disease)    non-obstructive CAD on cath January 2012. mLAD 40-50%  . Chest pain   . CHF (congestive heart failure) ([place])    due to recurrent NICM echo 06/2010: EF 20-25% mod-severe MR, moderate TR  . Diabetes mellitus   . Hyperlipidemia   . Hypertension     Assessment: Patient takes dofetilide PTA and is admitted with GIB. Pharmacy consulted to monitor electrolytes and replace if needed.  Patient confirmed that she takes dofetilide at 0900 and 2100 daily, that she has not missed doses recently, and last dose was taken at 0900 on 5/1 prior to admission.  Goal of Therapy:  K > 4 Mg > 1.8  Plan:  Per discussion with MD, patient had labs drawn this afternoon at doctors office (on paper chart). Spoke with RN who kindly informed me of labs: K=4.8, Mg = 2.  No supplementation needed at this time, will recheck with AM labs tomorrow.  Lenis Noon, PharmD, BCPS Clinical Pharmacist 10/18/2016,7:57 PM

## 2016-10-19 ENCOUNTER — Other Ambulatory Visit: Payer: Self-pay

## 2016-10-19 DIAGNOSIS — K2971 Gastritis, unspecified, with bleeding: Secondary | ICD-10-CM

## 2016-10-19 LAB — TYPE AND SCREEN
ABO/RH(D): A POS
ANTIBODY SCREEN: NEGATIVE
Unit division: 0
Unit division: 0

## 2016-10-19 LAB — BASIC METABOLIC PANEL
ANION GAP: 4 — AB (ref 5–15)
BUN: 20 mg/dL (ref 6–20)
CALCIUM: 8.8 mg/dL — AB (ref 8.9–10.3)
CO2: 26 mmol/L (ref 22–32)
CREATININE: 0.76 mg/dL (ref 0.44–1.00)
Chloride: 109 mmol/L (ref 101–111)
GFR calc Af Amer: >60 mL/min (ref >60)
GLUCOSE: 93 mg/dL (ref 65–99)
Potassium: 4.1 mmol/L (ref 3.5–5.1)
Sodium: 139 mmol/L (ref 135–145)

## 2016-10-19 LAB — CBC
HEMATOCRIT: 27.7 % — AB (ref 35.0–47.0)
Hemoglobin: 9.3 g/dL — ABNORMAL LOW (ref 12.0–16.0)
MCH: 28.7 pg (ref 26.0–34.0)
MCHC: 33.7 g/dL (ref 32.0–36.0)
MCV: 85.2 fL (ref 80.0–100.0)
PLATELETS: 185 10*3/uL (ref 150–440)
RBC: 3.25 MIL/uL — ABNORMAL LOW (ref 3.80–5.20)
RDW: 17.3 % — AB (ref 11.5–14.5)
WBC: 6.3 10*3/uL (ref 3.6–11.0)

## 2016-10-19 LAB — BPAM RBC
Blood Product Expiration Date: 201805042359
Blood Product Expiration Date: 201805052359
ISSUE DATE / TIME: 201805012004
ISSUE DATE / TIME: 201805012311
Unit Type and Rh: 600
Unit Type and Rh: 6200

## 2016-10-19 LAB — HEMOGLOBIN A1C
Hgb A1c MFr Bld: 6.7 % — ABNORMAL HIGH (ref 4.8–5.6)
Mean Plasma Glucose: 146 mg/dL

## 2016-10-19 LAB — GLUCOSE, CAPILLARY
GLUCOSE-CAPILLARY: 177 mg/dL — AB (ref 65–99)
Glucose-Capillary: 112 mg/dL — ABNORMAL HIGH (ref 65–99)
Glucose-Capillary: 106 mg/dL — ABNORMAL HIGH (ref 65–99)
Glucose-Capillary: 60 mg/dL — ABNORMAL LOW (ref 65–99)
Glucose-Capillary: 127 mg/dL — ABNORMAL HIGH (ref 65–99)

## 2016-10-19 LAB — HEMOGLOBIN: HEMOGLOBIN: 9.4 g/dL — AB (ref 12.0–16.0)

## 2016-10-19 LAB — MAGNESIUM
MAGNESIUM: 2.1 mg/dL (ref 1.7–2.4)
Magnesium: 2 mg/dL (ref 1.7–2.4)

## 2016-10-19 NOTE — Progress Notes (Signed)
MEDICATION RELATED CONSULT NOTE - INITIAL   Pharmacy Consult for Electrolyte monitoring Indication: On dofetilide  Allergies  Allergen Reactions  . Ace Inhibitors Cough    Vital Signs: Temp: 98.7 F (37.1 C) (05/02 0549) Temp Source: Oral (05/02 0549) BP: 102/50 (05/02 0549) Pulse Rate: 69 (05/02 0549) Intake/Output from previous day: 05/01 0701 - 05/02 0700 In: 1270 [P.O.:620; Blood:650] Out: 1500 [Urine:1500] Intake/Output from this shift: No intake/output data recorded.  Labs:  Recent Labs  10/19/16 0414  WBC 6.3  HGB 9.3*  HCT 27.7*  PLT 185  CREATININE 0.76  MG 2.0   CrCl cannot be calculated (Unknown ideal weight.).  Medical History: Past Medical History:  Diagnosis Date  . A-fib ([place])    failed amio due to Gi side effects (in [place])   . CAD (coronary artery disease)    non-obstructive CAD on cath January 2012. mLAD 40-50%  . Chest pain   . CHF (congestive heart failure) ([place])    due to recurrent NICM echo 06/2010: EF 20-25% mod-severe MR, moderate TR  . Diabetes mellitus   . Hyperlipidemia   . Hypertension     Assessment: Patient takes dofetilide PTA and is admitted with GIB. Pharmacy consulted to monitor electrolytes and replace if needed.  Patient confirmed that she takes dofetilide at 0900 and 2100 daily, that she has not missed doses recently, and last dose was taken at 0900 on 5/1 prior to admission.  Goal of Therapy:  K > 4 Mg > 1.8  Plan:  5/1 Per discussion with MD, patient had labs drawn this afternoon at doctors office (on paper chart). Spoke with RN who kindly informed me of labs: K=4.8, Mg = 2.  5/2: K= 4.1   Mg: 2.0  No supplementation needed at this time, will recheck with AM labs tomorrow.  Pernell Dupre, PharmD, BCPS Clinical Pharmacist 10/19/2016,7:53 AM

## 2016-10-19 NOTE — Progress Notes (Signed)
Patient was direct admin following a drop of hemoglobin from her baseline.   Patient and family were educated about blood transfusion and transfusion reactions. 2 units of PRBC was transfused per order.Patient tolerated the blood transfusion without any suspected reactions. Will continue to monitor. AM hemoglobin is 9.3

## 2016-10-19 NOTE — Evaluation (Signed)
Physical Therapy Evaluation Patient Details Name: April Park MRN: [id number] DOB: 03-16-1932 Today's Date: 10/19/2016   History of Present Illness  Pt is a 83 y.o. female admitted for GI bleed after presenting with anemia, low BP, and having tarry black stool with a hemoglobin of 6.2. Pt was transfused 2 units PRBC on 10/19/16 and hemoglobin was recheck at 9.4.  Pt has a PMH of A-fib, CAD, CHF, DM, HLD, HTN, s/p cardic cath, and knee arthroscopy.  Clinical Impression  Pt in bed with daughter-in-law at bedside upon entry. Pt very pleasant and agreeable to PT evaluation. Pt presents with decreased balance, strength, and activity tolerance. Pt was modified independent with bed mobility and CGA with transfers and ambulation with HHA. Pt ambulated 100 ft and tolerated stairs well, but stated that she was very tired at the end of the evaluation. Pt would benefit from skilled PT during admission and HHPT at d/c to address deficits mentioned above.    Follow Up Recommendations Home health PT;Supervision for mobility/OOB    Equipment Recommendations  Cane    Recommendations for Other Services       Precautions / Restrictions Precautions Precautions: Fall Restrictions Weight Bearing Restrictions: No      Mobility  Bed Mobility Overal bed mobility: Modified Independent             General bed mobility comments: HOB elevated   Transfers Overall transfer level: Needs assistance   Transfers: Sit to/from Stand Sit to Stand: Min guard         General transfer comment: Pt requires min guard for safety d/t feeling weak  Ambulation/Gait Ambulation/Gait assistance: Min guard Ambulation Distance (Feet): 100 Feet Assistive device: 1 person hand held assist Gait Pattern/deviations: Decreased step length - right;Decreased step length - left   Gait velocity interpretation: Below normal speed for age/gender General Gait Details: Pt reaches out for furnature or walls w/o hand held assist; pt  reports that she holds onto her husband when she walks most of the time; discussed use of a cane for when she walks w/o her husband  Stairs Stairs: Yes Stairs assistance: Min guard Stair Management: No rails Number of Stairs: 5    Wheelchair Mobility    Modified Rankin (Stroke Patients Only)       Balance Overall balance assessment: Needs assistance Sitting-balance support: No upper extremity supported;Feet unsupported Sitting balance-Leahy Scale: Good     Standing balance support: Single extremity supported;During functional activity Standing balance-Leahy Scale: Good                               Pertinent Vitals/Pain Pain Assessment: 0-10 Pain Score: 10-Worst pain ever Pain Location: L shoulder with overhead movement Pain Descriptors / Indicators: Discomfort Pain Intervention(s): Limited activity within patient's tolerance;Monitored during session;Patient requesting pain meds-RN notified  Vitals: HR: 68bpm BP: 114/50 O2 99% on RA    Home Living Family/patient expects to be discharged to:: Private residence Living Arrangements: Children;Spouse/significant other Available Help at Discharge: Family;Available 24 hours/day Type of Home: House Home Access: Stairs to enter Entrance Stairs-Rails: None Entrance Stairs-Number of Steps: 2-3 Home Layout: One level Home Equipment: Environmental consultant - 2 wheels (Pt's daughter reports that they may have a RW ) Additional Comments: Pt lives with her husband and intermittently stays with her son and daughter-in-law; pt's husband is available to assist her 24/7    Prior Function Level of Independence: Independent  Hand Dominance        Extremity/Trunk Assessment   Upper Extremity Assessment Upper Extremity Assessment: Generalized weakness    Lower Extremity Assessment Lower Extremity Assessment: Generalized weakness (B LE 4/5 strength)    Cervical / Trunk Assessment Cervical / Trunk Assessment:  Normal  Communication   Communication: No difficulties  Cognition Arousal/Alertness: Awake/alert Behavior During Therapy: WFL for tasks assessed/performed Overall Cognitive Status: Within Functional Limits for tasks assessed                                        General Comments      Exercises     Assessment/Plan    PT Assessment Patient needs continued PT services  PT Problem List Decreased strength;Decreased mobility;Decreased activity tolerance;Decreased balance;Decreased knowledge of use of DME       PT Treatment Interventions DME instruction;Therapeutic activities;Gait training;Therapeutic exercise;Patient/family education;Stair training;Balance training;Functional mobility training    PT Goals (Current goals can be found in the Care Plan section)  Acute Rehab PT Goals Patient Stated Goal: to get stronger and go home PT Goal Formulation: With patient Time For Goal Achievement: 11/02/16 Potential to Achieve Goals: Good    Frequency Min 2X/week   Barriers to discharge        Co-evaluation               AM-PAC PT "6 Clicks" Daily Activity  Outcome Measure Difficulty turning over in bed (including adjusting bedclothes, sheets and blankets)?: None Difficulty moving from lying on back to sitting on the side of the bed? : None Difficulty sitting down on and standing up from a chair with arms (e.g., wheelchair, bedside commode, etc,.)?: None Help needed moving to and from a bed to chair (including a wheelchair)?: A Little Help needed walking in hospital room?: A Little Help needed climbing 3-5 steps with a railing? : A Little 6 Click Score: 21    End of Session Equipment Utilized During Treatment: Gait belt Activity Tolerance: Patient tolerated treatment well Patient left: in chair;with call bell/phone within reach;with chair alarm set;with family/visitor present Nurse Communication: Mobility status;Patient requests pain meds (Pt reported 10/10  shoulder pain from previous chronic shoulder pathology) PT Visit Diagnosis: Unsteadiness on feet (R26.81);Muscle weakness (generalized) (M62.81);Other abnormalities of gait and mobility (R26.89)    Time: 3748-2707 PT Time Calculation (min) (ACUTE ONLY): 33 min   Charges:         PT G Codes:         Zaydyn Havey, SPT 10/19/2016, 4:26 PM

## 2016-10-19 NOTE — Progress Notes (Signed)
[facility] [place] NAME: April Park    MR#:  [id number]  DATE OF BIRTH:  08/28/1931  SUBJECTIVE:  CHIEF COMPLAINT:  No chief complaint on file. Patient is a 83 year old female with atypical history significant for history of coronary artery disease, eczema, atrial fibrillation, nonischemic cardiomyopathy with ejection fraction of 35-40%, diabetes, hypertension, who presented to the hospital with anemia and low blood pressure. Patient felt weak and the she was noted to have low blood pressure at 100/40. She was seen by her primary care physician, her labs were drawn and hemoglobin level was found to be 6.2, she had it was admitted as direct admit. She noted black tarry looking stool on and off for the past 2 or 3 months, no bright red blood, no vomiting, hematemesis. She denies any abdominal pain, feels comfortable today. Patient was transfused packed red blood cells after which hemoglobin level improved to 9.3, remains stable today. Patient complains of some cramping in her lower extremities, magnesium level is normal. Patient was noted to have iron deficiency with iron saturation is of only 5.  Review of Systems  Constitutional: Negative for chills, fever and weight loss.  HENT: Negative for congestion.   Eyes: Negative for blurred vision and double vision.  Respiratory: Negative for cough, sputum production, shortness of breath and wheezing.   Cardiovascular: Negative for chest pain, palpitations, orthopnea, leg swelling and PND.  Gastrointestinal: Negative for abdominal pain, blood in stool, constipation, diarrhea, nausea and vomiting.  Genitourinary: Negative for dysuria, frequency, hematuria and urgency.  Musculoskeletal: Negative for falls.  Neurological: Negative for dizziness, tremors, focal weakness and headaches.  Endo/Heme/Allergies: Does not bruise/bleed easily.  Psychiatric/Behavioral: Negative for depression. The patient  does not have insomnia.    admits of lower extremity cramping  VITAL SIGNS: Blood pressure (!) 102/50, pulse 69, temperature 98.7 F (37.1 C), temperature source Oral, resp. rate 18, height '5\' 1"'$  (1.549 m), weight 70.6 kg (155 lb 9.6 oz), SpO2 96 %.  PHYSICAL EXAMINATION:   GENERAL:  83 y.o.-year-old patient lying in the bed with no acute distress.  EYES: Pupils equal, round, reactive to light and accommodation. No scleral icterus. Extraocular muscles intact.  HEENT: Head atraumatic, normocephalic. Oropharynx and nasopharynx clear.  NECK:  Supple, no jugular venous distention. No thyroid enlargement, no tenderness.  LUNGS: Normal breath sounds bilaterally, no wheezing, rales,rhonchi or crepitation. No use of accessory muscles of respiration.  CARDIOVASCULAR: S1, S2 normal. No murmurs, rubs, or gallops.  ABDOMEN: Soft, nontender, nondistended. Bowel sounds present. No organomegaly or mass.  EXTREMITIES: No pedal edema, cyanosis, or clubbing.  NEUROLOGIC: Cranial nerves II through XII are intact. Muscle strength 5/5 in all extremities. Sensation intact. Gait not checked.  PSYCHIATRIC: The patient is alert and oriented x 3.  SKIN: No obvious rash, lesion, or ulcer.   ORDERS/RESULTS REVIEWED:   CBC  Recent Labs Lab 10/19/16 0414 10/19/16 1146  WBC 6.3  --   HGB 9.3* 9.4*  HCT 27.7*  --   PLT 185  --   MCV 85.2  --   MCH 28.7  --   MCHC 33.7  --   RDW 17.3*  --    ------------------------------------------------------------------------------------------------------------------  Chemistries   Recent Labs Lab 10/19/16 0414 10/19/16 1146  NA 139  --   K 4.1  --   CL 109  --   CO2 26  --   GLUCOSE 93  --   BUN 20  --  CREATININE 0.76  --   CALCIUM 8.8*  --   MG 2.0 2.1   ------------------------------------------------------------------------------------------------------------------ estimated creatinine clearance is 48.7 mL/min (by C-G formula based on SCr of 0.76  mg/dL). ------------------------------------------------------------------------------------------------------------------ No results for input(s): TSH, T4TOTAL, T3FREE, THYROIDAB in the last 72 hours.  Invalid input(s): FREET3  Cardiac Enzymes No results for input(s): CKMB, TROPONINI, MYOGLOBIN in the last 168 hours.  Invalid input(s): CK ------------------------------------------------------------------------------------------------------------------ Invalid input(s): POCBNP ---------------------------------------------------------------------------------------------------------------  RADIOLOGY: No results found.  EKG:  Orders placed or performed in visit on 03/22/11  . EKG 12-Lead    ASSESSMENT AND PLAN:  Active Problems:   GI bleed  #1. Acute on chronic posthemorrhagic anemia, status post packed red blood cell transfusion with improvement of hemoglobin level, iron deficiency was noted on iron studies, but prior to transfusion, patient may benefit from iron supplementation as outpatient after procedures are performed. #2. Gastrointestinal bleed, now off Xarelto, so neurology consultation is pending, continue PPI twice a day intravenously, bleeding has stopped, the patient may benefit from EGD as well as colonoscopy, continue clear liquid diet #3. Lower extremity cramping, likely due to iron deficiency anemia, status post transfusion, iron supplementation is recommended upon discharge #4. Diabetes mellitus, continue outpatient medications, fasting blood glucose level 112, relatively well controlled #5. Hypotension, better, now off Lasix, Entresto, Aldactone, Coreg, follow closely, resume medications when blood pressure improves  Management plans discussed with the patient, family and they are in agreement.   DRUG ALLERGIES:  Allergies  Allergen Reactions  . Ace Inhibitors Cough    CODE STATUS:     Code Status Orders        Start     Ordered   10/18/16 1743  Full  code  Continuous     10/18/16 1746    Code Status History    Date Active Date Inactive Code Status Order ID Comments User Context   This patient has a current code status but no historical code status.      TOTAL TIME TAKING CARE OF THIS PATIENT: 40 minutes.    Theodoro Grist M.D on 10/19/2016 at 2:48 PM  Between 7am to 6pm - Pager - [phone]  After 6pm go to [email or web address] - password [email or web address] [facility]  Office  [phone]  CC: Primary care physician; Volanda Napoleon, MD

## 2016-10-20 ENCOUNTER — Encounter: Payer: Self-pay | Admitting: *Deleted

## 2016-10-20 ENCOUNTER — Inpatient Hospital Stay: Payer: Medicare Other | Admitting: Certified Registered Nurse Anesthetist

## 2016-10-20 ENCOUNTER — Other Ambulatory Visit: Payer: Self-pay

## 2016-10-20 ENCOUNTER — Telehealth: Payer: Self-pay

## 2016-10-20 ENCOUNTER — Encounter
Admission: AD | Disposition: A | Discharge status: Home Health | Payer: Self-pay | Source: Ambulatory Visit | Attending: Internal Medicine

## 2016-10-20 ENCOUNTER — Ambulatory Visit: Admit: 2016-10-20 | Payer: Medicare Other | Admitting: Gastroenterology

## 2016-10-20 DIAGNOSIS — K2970 Gastritis, unspecified, without bleeding: Secondary | ICD-10-CM

## 2016-10-20 DIAGNOSIS — D62 Acute posthemorrhagic anemia: Secondary | ICD-10-CM

## 2016-10-20 DIAGNOSIS — R252 Cramp and spasm: Secondary | ICD-10-CM

## 2016-10-20 DIAGNOSIS — K31811 Angiodysplasia of stomach and duodenum with bleeding: Principal | ICD-10-CM

## 2016-10-20 DIAGNOSIS — Z9889 Other specified postprocedural states: Secondary | ICD-10-CM

## 2016-10-20 HISTORY — PX: ESOPHAGOGASTRODUODENOSCOPY (EGD) WITH PROPOFOL: SHX5813

## 2016-10-20 LAB — BASIC METABOLIC PANEL
Anion gap: 3 — ABNORMAL LOW (ref 5–15)
BUN: 16 mg/dL (ref 6–20)
CALCIUM: 8.7 mg/dL — AB (ref 8.9–10.3)
CO2: 26 mmol/L (ref 22–32)
Chloride: 108 mmol/L (ref 101–111)
Creatinine, Ser: 0.94 mg/dL (ref 0.44–1.00)
GFR calc Af Amer: >60 mL/min (ref >60)
GFR, EST NON AFRICAN AMERICAN: 55 mL/min — AB (ref >60)
GLUCOSE: 130 mg/dL — AB (ref 65–99)
Potassium: 4 mmol/L (ref 3.5–5.1)
SODIUM: 137 mmol/L (ref 135–145)

## 2016-10-20 LAB — GLUCOSE, CAPILLARY
GLUCOSE-CAPILLARY: 149 mg/dL — AB (ref 65–99)
Glucose-Capillary: 145 mg/dL — ABNORMAL HIGH (ref 65–99)
Glucose-Capillary: 190 mg/dL — ABNORMAL HIGH (ref 65–99)
Glucose-Capillary: 104 mg/dL — ABNORMAL HIGH (ref 65–99)

## 2016-10-20 LAB — HEMOGLOBIN
HEMOGLOBIN: 9 g/dL — AB (ref 12.0–16.0)
Hemoglobin: 9.1 g/dL — ABNORMAL LOW (ref 12.0–16.0)

## 2016-10-20 SURGERY — ESOPHAGOGASTRODUODENOSCOPY (EGD) WITH PROPOFOL
Anesthesia: General

## 2016-10-20 MED ORDER — EPHEDRINE SULFATE 50 MG/ML IJ SOLN
INTRAMUSCULAR | Status: AC
  Filled 2016-10-20: qty 1

## 2016-10-20 MED ORDER — PROPOFOL 10 MG/ML IV BOLUS
INTRAVENOUS | Status: AC
  Filled 2016-10-20: qty 20

## 2016-10-20 MED ORDER — PHENYLEPHRINE HCL 10 MG/ML IJ SOLN
INTRAMUSCULAR | Status: DC | PRN
  Administered 2016-10-20: 100 ug via INTRAVENOUS

## 2016-10-20 MED ORDER — FERROUS GLUCONATE 324 (38 FE) MG PO TABS: 324.0000 mg | ORAL_TABLET | Freq: Two times a day (BID) | ORAL | 3 refills | Status: DC

## 2016-10-20 MED ORDER — GLYCOPYRROLATE 0.2 MG/ML IJ SOLN
INTRAMUSCULAR | Status: AC
  Filled 2016-10-20: qty 1

## 2016-10-20 MED ORDER — PROPOFOL 10 MG/ML IV BOLUS
INTRAVENOUS | Status: DC | PRN
  Administered 2016-10-20 (×2): 25 mg via INTRAVENOUS

## 2016-10-20 MED ORDER — SUCCINYLCHOLINE CHLORIDE 20 MG/ML IJ SOLN
INTRAMUSCULAR | Status: AC
  Filled 2016-10-20: qty 1

## 2016-10-20 MED ORDER — GLYCOPYRROLATE 0.2 MG/ML IJ SOLN
INTRAMUSCULAR | Status: DC | PRN
  Administered 2016-10-20: 0.2 mg via INTRAVENOUS

## 2016-10-20 MED ORDER — PROPOFOL 500 MG/50ML IV EMUL
INTRAVENOUS | Status: DC | PRN
  Administered 2016-10-20: 50 ug/kg/min via INTRAVENOUS

## 2016-10-20 NOTE — Progress Notes (Signed)
Inpatient Diabetes Program Recommendations  AACE/ADA: New Consensus Statement on Inpatient Glycemic Control (2015)  Target Ranges:  Prepandial:   less than 140 mg/dL      Peak postprandial:   less than 180 mg/dL (1-2 hours)      Critically ill patients:  140 - 180 mg/dL   Lab Results  Component Value Date   GLUCAP 190 (H) 10/20/2016   HGBA1C 6.7 (H) 10/18/2016     Results for April Park, April Park (MRN [id number]) as of 10/20/2016 15:48  Ref. Range 10/19/2016 21:27 10/20/2016 07:19 10/20/2016 09:32 10/20/2016 11:40  Glucose-Capillary Latest Ref Range: 65 - 99 mg/dL 127 (H) 145 (H) 190 (H) 149 (H)   Diabetes history: Type 2 Outpatient Diabetes medications: Glucophage '500mg'$  bid, Januvia 50 mg qday, Amaryl 1.'5mg'$  bid(confirmed with patient and her husband at the bedside)  Current orders for Inpatient glycemic control: Novolog 0-9 units tid  Inpatient Diabetes Program Recommendations:   Agree with current medications for blood sugar management.    Gentry Fitz, RN, BA, MHA, CDE Diabetes Coordinator Inpatient Diabetes Program  [phone] (Team Pager) [phone] ([place]) 10/20/2016 3:48 PM

## 2016-10-20 NOTE — Progress Notes (Signed)
MEDICATION RELATED CONSULT NOTE - INITIAL   Pharmacy Consult for Electrolyte monitoring Indication: On dofetilide  Allergies  Allergen Reactions  . Ace Inhibitors Cough  . Amiodarone Nausea Only and Nausea And Vomiting    Has tried med on 2 different occasions with same reaction     Vital Signs: Temp: 96 F (35.6 C) (05/03 0755) Temp Source: Tympanic (05/03 0755) BP: 118/66 (05/03 0945) Pulse Rate: 67 (05/03 0945) Intake/Output from previous day: 05/02 0701 - 05/03 0700 In: 1240 [P.O.:1240] Out: 2400 [Urine:2400] Intake/Output from this shift: Total I/O In: 200 [I.V.:200] Out: -   Labs:  Recent Labs  10/19/16 0414 10/19/16 1146 10/20/16 0336  WBC 6.3  --   --   HGB 9.3* 9.4* 9.0*  HCT 27.7*  --   --   PLT 185  --   --   CREATININE 0.76  --  0.94  MG 2.0 2.1  --    Estimated Creatinine Clearance: 40.8 mL/min (by C-G formula based on SCr of 0.94 mg/dL).  Medical History: Past Medical History:  Diagnosis Date  . A-fib ([place])    failed amio due to Gi side effects (in [place])   . CAD (coronary artery disease)    non-obstructive CAD on cath January 2012. mLAD 40-50%  . Chest pain   . CHF (congestive heart failure) ([place])    due to recurrent NICM echo 06/2010: EF 20-25% mod-severe MR, moderate TR  . Diabetes mellitus   . Hyperlipidemia   . Hypertension     Assessment: Patient takes dofetilide PTA and is admitted with GIB. Pharmacy consulted to monitor electrolytes and replace if needed.  Patient confirmed that she takes dofetilide at 0900 and 2100 daily, that she has not missed doses recently, and last dose was taken at 0900 on 5/1 prior to admission.  Goal of Therapy:  K > 4 Mg > 1.8  Plan:  5/3: K= 4.0   Mg: 2.1  No supplementation needed at this time. Will move to checking electrolytes q48 hours. Pharmacy will continue to follow and replace electrolytes as needed per consult.   Pernell Dupre, PharmD, BCPS Clinical Pharmacist 10/20/2016,10:51  AM

## 2016-10-20 NOTE — H&P (Signed)
[name] MD [address] [place], [place] [address] Phone: [phone] Fax : [phone]  Primary Care Physician:  Volanda Napoleon, MD Primary Gastroenterologist:  Dr. [name]   Pre-Procedure History & Physical: HPI:  April Park is a 83 y.o. female is here for an endoscopy.   Past Medical History:  Diagnosis Date  . A-fib ([place])    failed amio due to Gi side effects (in [place])   . CAD (coronary artery disease)    non-obstructive CAD on cath January 2012. mLAD 40-50%  . Chest pain   . CHF (congestive heart failure) ([place])    due to recurrent NICM echo 06/2010: EF 20-25% mod-severe MR, moderate TR  . Diabetes mellitus   . Hyperlipidemia   . Hypertension     Past Surgical History:  Procedure Laterality Date  . CARDIAC CATHETERIZATION    . KNEE ARTHROSCOPY     2009    Prior to Admission medications   Medication Sig Start Date End Date Taking? Authorizing Provider  b complex vitamins tablet Take 1 tablet by mouth daily.   Yes Historical Provider, MD  calcium-vitamin D 250-100 MG-UNIT per tablet Take 1 tablet by mouth 2 (two) times daily.    Yes Historical Provider, MD  carvedilol (COREG) 6.25 MG tablet Take 6.25 mg by mouth 2 (two) times daily with a meal.    Yes Historical Provider, MD  Cholecalciferol (VITAMIN D3) 1000 UNITS CAPS Take 1,000 Units by mouth.   Yes Historical Provider, MD  dexlansoprazole (DEXILANT) 60 MG capsule Take 60 mg by mouth daily.   Yes Historical Provider, MD  dofetilide (TIKOSYN) 125 MCG capsule Take 125 mcg by mouth 2 (two) times daily.   Yes Historical Provider, MD  furosemide (LASIX) 20 MG tablet Take 20 mg by mouth daily as needed.    Yes Historical Provider, MD  glimepiride (AMARYL) 1 MG tablet Take 1.5 mg by mouth 2 (two) times daily.    Yes Historical Provider, MD  Magnesium 250 MG TABS Take 1 tablet by mouth 2 (two) times daily.   Yes Historical Provider, MD  metFORMIN (GLUCOPHAGE) 500 MG tablet Take 500 mg by mouth  2 (two) times daily with a meal.   Yes Historical Provider, MD  Rivaroxaban (XARELTO) 15 MG TABS tablet Take 15 mg by mouth daily.     Yes Historical Provider, MD  rosuvastatin (CRESTOR) 10 MG tablet Take 10 mg by mouth daily.   Yes Historical Provider, MD  sacubitril-valsartan (ENTRESTO) 49-51 MG Take 1 tablet by mouth 2 (two) times daily.   Yes Historical Provider, MD  sitaGLIPtin (JANUVIA) 50 MG tablet Take 50 mg by mouth daily.    Yes Historical Provider, MD  spironolactone (ALDACTONE) 25 MG tablet Take 25 mg by mouth daily.   Yes Historical Provider, MD  candesartan (ATACAND) 4 MG tablet Take 1 tablet (4 mg total) by mouth at bedtime. Patient not taking: Reported on 10/18/2016 06/28/11 06/27/12  Jolaine Artist, MD  glucose blood (ACCU-CHEK AVIVA PLUS) test strip Use as instructed to check blood sugar 2 times per day dx code 250.02 09/11/13   Elayne Snare, MD  metFORMIN (GLUMETZA) 500 MG (MOD) 24 hr tablet Take 500 mg by mouth 2 (two) times daily with a meal. 1tablet every morning 2 tablets at night    Historical Provider, MD    Allergies as of 10/18/2016 - Review Complete 10/18/2016  Allergen Reaction Noted  . Ace inhibitors Cough 06/28/2011    Family History  Problem Relation Age of Onset  . Arrhythmia Mother 62  . Heart failure Mother     diastolic dysfunction  . Diabetes Sister     family history  . Hypertension      family history    Social History   Social History  . Marital status: Married    Spouse name: N/A  . Number of children: N/A  . Years of education: N/A   Occupational History  . Not on file.   Social History Main Topics  . Smoking status: Never Smoker  . Smokeless tobacco: Never Used  . Alcohol use No  . Drug use: No  . Sexual activity: Not on file   Other Topics Concern  . Not on file   Social History Narrative   Retired    Married    Tobacco Use - No.    Alcohol Use - no   Regular Exercise - no   Drug Use - no   Smoking Status:  never   Does  Patient Exercise:  no   Drug Use:  no    Review of Systems: See HPI, otherwise negative ROS  Physical Exam: BP 128/68   Pulse 91   Temp (!) 96.9 F (36.1 C) (Tympanic)   Resp 16   Ht '5\' 1"'$  (1.549 m)   Wt 150 lb 4.8 oz (68.2 kg)   SpO2 100%   BMI 28.40 kg/m  General:   Alert,  pleasant and cooperative in NAD Head:  Normocephalic and atraumatic. Neck:  Supple; no masses or thyromegaly. Lungs:  Clear throughout to auscultation.    Heart:  Regular rate and rhythm. Abdomen:  Soft, nontender and nondistended. Normal bowel sounds, without guarding, and without rebound.   Neurologic:  Alert and  oriented x4;  grossly normal neurologically.  Impression/Plan: April Park is here for an endoscopy to be performed for gi bleed  Risks, benefits, limitations, and alternatives regarding  endoscopy have been reviewed with the patient.  Questions have been answered.  All parties agreeable.   [name], MD  10/20/2016, 7:33 AM

## 2016-10-20 NOTE — Progress Notes (Signed)
Per Dr. Ether Griffins, let her know results of 5pm lab draw (Hgb) before discharging patient.

## 2016-10-20 NOTE — Care Management Important Message (Signed)
Important Message  Patient Details  Name: April Park MRN: [id number] Date of Birth: [birth year]   Medicare Important Message Given:  Yes doumentation that notice was given on 5/2 is error   Katrina Stack, RN 10/20/2016, 3:14 PM

## 2016-10-20 NOTE — Telephone Encounter (Signed)
LVM for patient callback to schedule Follow-Up appointment.   Per Dr. Vicente Males, schedule patient for her 1st available time. He will accommodate her schedule.

## 2016-10-20 NOTE — Anesthesia Postprocedure Evaluation (Signed)
Anesthesia Post Note  Patient: Elanda Garmany  Procedure(s) Performed: Procedure(s) (LRB): ESOPHAGOGASTRODUODENOSCOPY (EGD) WITH PROPOFOL (N/A)  Patient location during evaluation: Endoscopy Anesthesia Type: General Level of consciousness: awake and alert Pain management: pain level controlled Vital Signs Assessment: post-procedure vital signs reviewed and stable Respiratory status: spontaneous breathing, nonlabored ventilation, respiratory function stable and patient connected to nasal cannula oxygen Cardiovascular status: blood pressure returned to baseline and stable Postop Assessment: no signs of nausea or vomiting Anesthetic complications: no     Last Vitals:  Vitals:   10/20/16 0825 10/20/16 0945  BP: 114/68 118/66  Pulse: 75 67  Resp: 19   Temp:      Last Pain:  Vitals:   10/20/16 0755  TempSrc: Tympanic  PainSc:                  Martha Clan

## 2016-10-20 NOTE — Care Management Important Message (Signed)
Important Message  Patient Details  Name: April Park MRN: [id number] Date of Birth: [birth year]   Medicare Important Message Given:  Yes  Signed IM notice given     Katrina Stack, RN 10/20/2016, 3:12 PM

## 2016-10-20 NOTE — Op Note (Signed)
[facility] Gastroenter[facility] Patient Name: April Park Procedure Date: 10/20/2016 7:14 AM MRN: [id number] Account #: [id number] Date of Birth: [birth year] Admit Type: Inpatient Age: 83 Room: [facility] ENDO ROOM 4 Gender: Female Note Status: Finalized Procedure:            Upper GI endoscopy Indications:          Melena Providers:            [name] MD, MD Referring MD:         Perrin Maltese, MD (Referring MD) Medicines:            Monitored Anesthesia Care Complications:        No immediate complications. Procedure:            Pre-Anesthesia Assessment:                       - Prior to the procedure, a History and Physical was                        performed, and patient medications, allergies and                        sensitivities were reviewed. The patient's tolerance of                        previous anesthesia was reviewed.                       - ASA Grade Assessment: III - A patient with severe                        systemic disease.                       After obtaining informed consent, the endoscope was                        passed under direct vision. Throughout the procedure,                        the patient's blood pressure, pulse, and oxygen                        saturations were monitored continuously. The Endoscope                        was introduced through the mouth, and advanced to the                        third part of duodenum. The upper GI endoscopy was                        accomplished with ease. The patient tolerated the                        procedure well. Findings:      The esophagus was normal.      Localized moderate inflammation characterized by congestion (edema) and       erythema was found in the gastric body.      One 5 mm angioectasia with bleeding was found in the third  portion of       the duodenum. Coagulation for hemostasis using argon plasma at 0.5       liters/minute and 20 watts was successful. Impression:            - Normal esophagus.                       - Gastritis.                       - One bleeding angioectasia in the duodenum. Treated                        with argon plasma coagulation (APC).                       - No specimens collected. Recommendation:       - Return patient to hospital ward for ongoing care.                       - Advance diet as tolerated today.                       - 1. continue oral PPI                       2. Check H pylori stool antigen as she has gastritis                       3. If possible hold restarting xarelto for 24-48 hours                       4. Resume diet                       5. If has no further evidence of bleeding then                        discharge with out patient close monitoring of CBC and                        GI follow up to discuss if further evaluation for                        anemia such as colonoscopy or capsule study is                        warranted . Procedure Code(s):    --- Professional ---                       [phone], Esophagogastroduodenoscopy, flexible, transoral;                        with control of bleeding, any method Diagnosis Code(s):    --- Professional ---                       K29.70, Gastritis, unspecified, without bleeding                       K31.811, Angiodysplasia of stomach and duodenum with  bleeding                       K92.1, Melena (includes Hematochezia) CPT copyright 2016 American Medical Association. All rights reserved. The codes documented in this report are preliminary and upon coder review may  be revised to meet current compliance requirements. [name], MD [name] MD, MD 10/20/2016 7:55:34 AM This report has been signed electronically. Number of Addenda: 0 Note Initiated On: 10/20/2016 7:14 AM      [facility], [place]

## 2016-10-20 NOTE — Care Management (Signed)
CM spoke with patient and her husband.  Physical therapy has recommended home health therapy.  Explained home health, homebound criteria and medicare coverage and compared it to outpatient and medicare coverage.  Agreeable to home health and no agency preference. Referral to [facility] and accepted.  Has a walker at home

## 2016-10-20 NOTE — Anesthesia Post-op Follow-up Note (Cosign Needed)
Anesthesia QCDR form completed.        

## 2016-10-20 NOTE — Progress Notes (Signed)
Patient given discharge teaching and paperwork regarding medications, diet, follow-up appointments and activity. Patient understanding verbalized. No complaints at this time. IV and telemetry discontinued prior to leaving. Skin assessment as previously charted and vitals are stable; on room air. Patient being discharged to home. Caregiver/family present during discharge teaching. No further needs by Care Management. Prescription sent to pharmacy.   Husband at bedside concerned that some meds listed as home meds are no longer taken - updated list and printed new AVS. Per husband, they are ready to go home and will get their PCP to confirm and print a clean list to bring to the hospital next time.

## 2016-10-20 NOTE — Progress Notes (Signed)
Hgb 9.1 - proceed with discharge per Dr. Ether Griffins.

## 2016-10-20 NOTE — Transfer of Care (Signed)
Immediate Anesthesia Transfer of Care Note  Patient: April Park  Procedure(s) Performed: Procedure(s): ESOPHAGOGASTRODUODENOSCOPY (EGD) WITH PROPOFOL (N/A)  Patient Location: PACU  Anesthesia Type:General  Level of Consciousness: responds to stimulation  Airway & Oxygen Therapy: Patient Spontanous Breathing and Patient connected to nasal cannula oxygen  Post-op Assessment: Report given to RN and Post -op Vital signs reviewed and stable  Post vital signs: Reviewed and stable  Last Vitals:  Vitals:   10/20/16 0415 10/20/16 0706  BP: 119/87 128/68  Pulse: 71 91  Resp:  16  Temp: 36.5 C (!) 36.1 C    Last Pain:  Vitals:   10/20/16 0706  TempSrc: Tympanic  PainSc:          Complications: No apparent anesthesia complications

## 2016-10-20 NOTE — Anesthesia Preprocedure Evaluation (Signed)
Anesthesia Evaluation  Patient identified by MRN, date of birth, ID band Patient awake    Reviewed: Allergy & Precautions, H&P , NPO status , Patient's Chart, lab work & pertinent test results, reviewed documented beta blocker date and time   History of Anesthesia Complications Negative for: history of anesthetic complications  Airway Mallampati: III  TM Distance: >3 FB Neck ROM: full    Dental  (+) Dental Advidsory Given   Pulmonary neg pulmonary ROS,           Cardiovascular Exercise Tolerance: Good hypertension, (-) angina+ CAD and +CHF  (-) Past MI, (-) Cardiac Stents and (-) CABG + dysrhythmias (s/p ablation) + pacemaker (-) Valvular Problems/Murmurs     Neuro/Psych negative neurological ROS  negative psych ROS   GI/Hepatic Neg liver ROS, GERD  ,  Endo/Other  diabetes  Renal/GU CRFRenal disease  negative genitourinary   Musculoskeletal   Abdominal   Peds  Hematology negative hematology ROS (+)   Anesthesia Other Findings Past Medical History: No date: A-fib [facility])     Comment: failed amio due to Gi side effects (in               [place])  No date: CAD (coronary artery disease)     Comment: non-obstructive CAD on cath January 2012. mLAD              40-50% No date: Chest pain No date: CHF (congestive heart failure) (HCC)     Comment: due to recurrent NICM echo 06/2010: EF 20-25%               mod-severe MR, moderate TR No date: Diabetes mellitus No date: Hyperlipidemia No date: Hypertension   Reproductive/Obstetrics negative OB ROS                             Anesthesia Physical Anesthesia Plan  ASA: III  Anesthesia Plan: General   Post-op Pain Management:    Induction:   Airway Management Planned:   Additional Equipment:   Intra-op Plan:   Post-operative Plan:   Informed Consent: I have reviewed the patients History and Physical, chart, labs and discussed the  procedure including the risks, benefits and alternatives for the proposed anesthesia with the patient or authorized representative who has indicated his/her understanding and acceptance.   Dental Advisory Given  Plan Discussed with: Anesthesiologist, CRNA and Surgeon  Anesthesia Plan Comments:         Anesthesia Quick Evaluation

## 2016-10-20 NOTE — Progress Notes (Signed)
While rounding at the unit, Chaplain visited the patient with her husband. The couple that is originally from [place] had a good conversation with Chaplain. Patient, her husband and Chaplain talked about life of immigrating to the [place] and some stories about our countries of origin. Patient and her husband are Muslim.    10/20/16 1100  Clinical Encounter Type  Visited With Patient and family together  Visit Type Initial  Spiritual Encounters  Spiritual Needs Other (Comment)

## 2016-10-20 NOTE — Discharge Summary (Signed)
[facility] NAME: April Park    MR#:  [id number]  DATE OF BIRTH:  01/11/1932  DATE OF ADMISSION:  10/18/2016 ADMITTING PHYSICIAN: Hillary Bow, MD  DATE OF DISCHARGE: No discharge date for patient encounter.  PRIMARY CARE PHYSICIAN: TEJAN-SIE, Stollings, MD     ADMISSION DIAGNOSIS:  A Fib low hemoglobin   GI Bleed K92.2  DISCHARGE DIAGNOSIS:  Active Problems:   GI bleed   Acute posthemorrhagic anemia   Gastritis   History of esophagogastroduodenoscopy (EGD)   Leg cramping   SECONDARY DIAGNOSIS:   Past Medical History:  Diagnosis Date  . A-fib ([place])    failed amio due to Gi side effects (in [place])   . CAD (coronary artery disease)    non-obstructive CAD on cath January 2012. mLAD 40-50%  . Chest pain   . CHF (congestive heart failure) ([place])    due to recurrent NICM echo 06/2010: EF 20-25% mod-severe MR, moderate TR  . Diabetes mellitus   . Hyperlipidemia   . Hypertension     .pro HOSPITAL COURSE:   The patient is a 83 year old female with past medical history significant for history of atrial fibrillation, on Xarelto, coronary artery disease, CHF, cardiomyopathy, diabetes, hyperlipidemia, hypertension, who presents to the hospital with complaints of low blood pressure and anemia with hemoglobin level of 6.2. She apparently had on-and-off black stool for the past 2 or 3 months, no bright red blood in stool, no nausea, vomiting or hematemesis. She admitted of having some burning sensation in the chest with food intake. She was admitted to the hospital for further evaluation and treatment. Xarelto was stopped for evaluation. Patient was consulted by gastroenterologist and underwent EGD 10/20/2016, revealing duodenal angiectasia which was coagulated with argon plasma, gastritis. Patient was initiated on diet, her hemoglobin level will be rechecked later in the day, if stable, she will be discharged home.  She is to resume Xarelto in 48 hours after procedure, if no new changes, such as bleeding, weakness, hypotension. Patient complained of some cramping in her lower extremity is, which was felt to be due to iron deficiency anemia Discussion by problem: #1. Acute on chronic posthemorrhagic anemia, status post packed red blood cell transfusion with improvement of hemoglobin level, iron deficiency was noted on iron studies prior to transfusion, patient is initiated on iron gluconate, to be continued off a few months until iron stores are replenished. Recheck hemoglobin level later today, if stable, discharge, the patient home, discussed this patient's son, Dr. Humphrey Rolls #2. Gastrointestinal bleed, now off Xarelto, status post EGD 10/20/2016 revealing gastritis and duodenal angiectasia which was treated with argon plasma coagulation, patient's diet was just titrated, she is to continue  PPI, bleeding has stopped, the patient may benefit from  colonoscopy versus capsule endoscopy as outpatient, per gastroenterologist, continue regular diet, follow-up was gastroenterologist as outpatient #3. Lower extremity cramping, due to iron deficiency anemia, status post transfusion, iron supplementation is to be continued upon discharge #4. Diabetes mellitus, continue outpatient medications, fasting blood glucose level 145 today in the morning, relatively well controlled #5. Hypotension, better overall,, resume Lasix, Entresto, Aldactone, Coreg, follow blood pressure readings closely, follow-up with primary care physician, cardiologist as outpatient DISCHARGE CONDITIONS:   Stable  CONSULTS OBTAINED:    DRUG ALLERGIES:   Allergies  Allergen Reactions  . Ace Inhibitors Cough  . Amiodarone Nausea Only and Nausea And Vomiting    Has tried med on 2 different  occasions with same reaction     DISCHARGE MEDICATIONS:   Current Discharge Medication List    START taking these medications   Details  ferrous gluconate  (FERGON) 324 MG tablet Take 1 tablet (324 mg total) by mouth 2 (two) times daily with a meal. Qty: 60 tablet, Refills: 3      CONTINUE these medications which have NOT CHANGED   Details  !! b complex vitamins tablet Take 1 tablet by mouth daily.    calcium-vitamin D 250-100 MG-UNIT per tablet Take 1 tablet by mouth 2 (two) times daily.     carvedilol (COREG) 6.25 MG tablet Take 6.25 mg by mouth 2 (two) times daily with a meal.     Cholecalciferol (VITAMIN D3) 1000 UNITS CAPS Take 1,000 Units by mouth.    dofetilide (TIKOSYN) 125 MCG capsule Take 125 mcg by mouth 2 (two) times daily.    furosemide (LASIX) 20 MG tablet Take 20 mg by mouth daily as needed.     glimepiride (AMARYL) 1 MG tablet Take 1.5 mg by mouth 2 (two) times daily.     Magnesium 250 MG TABS Take 1 tablet by mouth 2 (two) times daily.    !! metFORMIN (GLUCOPHAGE) 500 MG tablet Take 500 mg by mouth 2 (two) times daily with a meal.    rosuvastatin (CRESTOR) 10 MG tablet Take 10 mg by mouth daily.    sacubitril-valsartan (ENTRESTO) 49-51 MG Take 1 tablet by mouth 2 (two) times daily.    sitaGLIPtin (JANUVIA) 50 MG tablet Take 50 mg by mouth daily.     spironolactone (ALDACTONE) 25 MG tablet Take 25 mg by mouth daily.    !! B Complex Vitamins (VITAMIN-B COMPLEX) TABS Take by mouth.    !! B Complex-C-Folic Acid (TRIPHROCAPS) 1 MG CAPS Refills: 0    !! b complex-C-folic acid 1 MG capsule Take by mouth.    bisacodyl (DULCOLAX) 5 MG EC tablet Take by mouth.    Calcium Carbonate-Vitamin D (OS-CAL 500 + D PO) Take by mouth.    candesartan (ATACAND) 4 MG tablet Take 1 tablet (4 mg total) by mouth at bedtime. Qty: 30 tablet, Refills: 6    glucose blood (ACCU-CHEK AVIVA PLUS) test strip Use as instructed to check blood sugar 2 times per day dx code 250.02 Qty: 100 each, Refills: 3    !! metFORMIN (GLUCOPHAGE) 1000 MG tablet Take 1,000 mg by mouth daily. Refills: 0    nitroGLYCERIN (NITROSTAT) 0.4 MG SL tablet  Place 1 tablet under tongue as needed for chest pain (may repeat every 5 minutes but seek medical help if pain persists after 3 tablets) as needed    ondansetron (ZOFRAN) 4 MG tablet take 1 to 2 tablets by mouth every 8 hours if needed Refills: 0    oxyCODONE-acetaminophen (PERCOCET/ROXICET) 5-325 MG tablet take 1 tablet by mouth every 6 hours if needed Refills: 0    pantoprazole (PROTONIX) 40 MG tablet Take by mouth.     !! - Potential duplicate medications found. Please discuss with provider.    STOP taking these medications     dexlansoprazole (DEXILANT) 60 MG capsule      Rivaroxaban (XARELTO) 15 MG TABS tablet      metFORMIN (GLUMETZA) 500 MG (MOD) 24 hr tablet          DISCHARGE INSTRUCTIONS:    The patient is to follow-up with primary care physician, gastroenterologist as outpatient  If you experience worsening of your admission symptoms, develop shortness of breath,  life threatening emergency, suicidal or homicidal thoughts you must seek medical attention immediately by calling 911 or calling your MD immediately  if symptoms less severe.  You Must read complete instructions/literature along with all the possible adverse reactions/side effects for all the Medicines you take and that have been prescribed to you. Take any new Medicines after you have completely understood and accept all the possible adverse reactions/side effects.   Please note  You were cared for by a hospitalist during your hospital stay. If you have any questions about your discharge medications or the care you received while you were in the hospital after you are discharged, you can call the unit and asked to speak with the hospitalist on call if the hospitalist that took care of you is not available. Once you are discharged, your primary care physician will handle any further medical issues. Please note that NO REFILLS for any discharge medications will be authorized once you are discharged, as it is  imperative that you return to your primary care physician (or establish a relationship with a primary care physician if you do not have one) for your aftercare needs so that they can reassess your need for medications and monitor your lab values.    Today   CHIEF COMPLAINT:  No chief complaint on file.   HISTORY OF PRESENT ILLNESS:  April Park  is a 83 y.o. female with a known history of atrial fibrillation, on Xarelto, coronary artery disease, CHF, cardiomyopathy, diabetes, hyperlipidemia, hypertension, who presents to the hospital with complaints of low blood pressure and anemia with hemoglobin level of 6.2. She apparently had on-and-off black stool for the past 2 or 3 months, no bright red blood in stool, no nausea, vomiting or hematemesis. She admitted of having some burning sensation in the chest with food intake. She was admitted to the hospital for further evaluation and treatment. Xarelto was stopped for evaluation. Patient was consulted by gastroenterologist and underwent EGD 10/20/2016, revealing duodenal angiectasia which was coagulated with argon plasma, gastritis. Patient was initiated on diet, her hemoglobin level will be rechecked later in the day, if stable, she will be discharged home. She is to resume Xarelto in 48 hours after procedure, if no new changes, such as bleeding, weakness, hypotension. Patient complained of some cramping in her lower extremity is, which was felt to be due to iron deficiency anemia Discussion by problem: #1. Acute on chronic posthemorrhagic anemia, status post packed red blood cell transfusion with improvement of hemoglobin level, iron deficiency was noted on iron studies prior to transfusion, patient is initiated on iron gluconate, to be continued off a few months until iron stores are replenished. Recheck hemoglobin level later today, if stable, discharge, the patient home, discussed this patient's son, Dr. Humphrey Rolls #2. Gastrointestinal bleed, now off  Xarelto, status post EGD 10/20/2016 revealing gastritis and duodenal angiectasia which was treated with argon plasma coagulation, patient's diet was just titrated, she is to continue  PPI, bleeding has stopped, the patient may benefit from  colonoscopy versus capsule endoscopy as outpatient, per gastroenterologist, continue regular diet, follow-up was gastroenterologist as outpatient #3. Lower extremity cramping, due to iron deficiency anemia, status post transfusion, iron supplementation is to be continued upon discharge #4. Diabetes mellitus, continue outpatient medications, fasting blood glucose level 145 today in the morning, relatively well controlled #5. Hypotension, better overall,, resume Lasix, Entresto, Aldactone, Coreg, follow blood pressure readings closely, follow-up with primary care physician, cardiologist as outpatient    VITAL SIGNS:  Blood  pressure (!) 127/53, pulse 67, temperature 98.3 F (36.8 C), temperature source Oral, resp. rate 16, height '5\' 1"'$  (1.549 m), weight 68.2 kg (150 lb 4.8 oz), SpO2 97 %.  I/O:    Intake/Output Summary (Last 24 hours) at 10/20/16 1411 Last data filed at 10/20/16 0756  Gross per 24 hour  Intake              960 ml  Output             2400 ml  Net            -1440 ml    PHYSICAL EXAMINATION:  GENERAL:  83 y.o.-year-old patient lying in the bed with no acute distress.  EYES: Pupils equal, round, reactive to light and accommodation. No scleral icterus. Extraocular muscles intact.  HEENT: Head atraumatic, normocephalic. Oropharynx and nasopharynx clear.  NECK:  Supple, no jugular venous distention. No thyroid enlargement, no tenderness.  LUNGS: Normal breath sounds bilaterally, no wheezing, rales,rhonchi or crepitation. No use of accessory muscles of respiration.  CARDIOVASCULAR: S1, S2 normal. No murmurs, rubs, or gallops.  ABDOMEN: Soft, non-tender, non-distended. Bowel sounds present. No organomegaly or mass.  EXTREMITIES: No pedal edema,  cyanosis, or clubbing.  NEUROLOGIC: Cranial nerves II through XII are intact. Muscle strength 5/5 in all extremities. Sensation intact. Gait not checked.  PSYCHIATRIC: The patient is alert and oriented x 3.  SKIN: No obvious rash, lesion, or ulcer.   DATA REVIEW:   CBC  Recent Labs Lab 10/19/16 0414  10/20/16 0336  WBC 6.3  --   --   HGB 9.3*  < > 9.0*  HCT 27.7*  --   --   PLT 185  --   --   < > = values in this interval not displayed.  Chemistries   Recent Labs Lab 10/19/16 1146 10/20/16 0336  NA  --  137  K  --  4.0  CL  --  108  CO2  --  26  GLUCOSE  --  130*  BUN  --  16  CREATININE  --  0.94  CALCIUM  --  8.7*  MG 2.1  --     Cardiac Enzymes No results for input(s): TROPONINI in the last 168 hours.  Microbiology Results  No results found for this or any previous visit.  RADIOLOGY:  No results found.  EKG:   Orders placed or performed in visit on 03/22/11  . EKG 12-Lead      Management plans discussed with the patient, family and they are in agreement.  CODE STATUS:     Code Status Orders        Start     Ordered   10/18/16 1743  Full code  Continuous     10/18/16 1746    Code Status History    Date Active Date Inactive Code Status Order ID Comments User Context   This patient has a current code status but no historical code status.      TOTAL TIME TAKING CARE OF THIS PATIENT: 40 minutes.  Discussed with Dr. Buren Kos M.D on 10/20/2016 at 2:11 PM  Between 7am to 6pm - Pager - [phone]  After 6pm go to [email or web address] - password [email or web address]  Office  [phone]  CC: Primary care physician; Volanda Napoleon, MD

## 2016-10-21 ENCOUNTER — Encounter: Payer: Self-pay | Admitting: Gastroenterology

## 2016-11-02 ENCOUNTER — Telehealth: Payer: Self-pay | Admitting: *Deleted

## 2016-11-02 ENCOUNTER — Ambulatory Visit (INDEPENDENT_AMBULATORY_CARE_PROVIDER_SITE_OTHER): Payer: Medicare Other | Admitting: Gastroenterology

## 2016-11-02 ENCOUNTER — Encounter: Payer: Self-pay | Admitting: Gastroenterology

## 2016-11-02 ENCOUNTER — Other Ambulatory Visit: Payer: Self-pay

## 2016-11-02 VITALS — BP 110/73 | HR 71 | Temp 98.1°F | Ht 61.0 in | Wt 154.2 lb

## 2016-11-02 DIAGNOSIS — D509 Iron deficiency anemia, unspecified: Secondary | ICD-10-CM | POA: Diagnosis not present

## 2016-11-02 NOTE — Progress Notes (Signed)
Primary Care Physician: Jodi Marble, MD  Primary Gastroenterologist:  Dr. [name]   No chief complaint on file.   HPI: April Park is a 83 y.o. female    She is here today for a hospital follow up    Summary of history : She was admitted on 10/18/16 when I was consulted to see her for symptomatic anemia. She has a history of atrial fibrillation. S/p ablation , pacemaker. She was on Xarelto.She was admitted with a Hb of around 6 grams and an elevated MCV.Hb in 07/2016 was 9.4 grams with MCV of 89. She had a preceding history of tarry black stools for a few weeks prior to admission  Iron studies showed a low iron at 18 , ferritin 12 . 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 At discharge she had no overt bleeding and her Hb was 9.1 grams after transfusion.  Her son Dr Devore is a cardiologist at our hospital and he stated that the plan was to resume Xarelto , she was in the process of returning to [place] soon.   Interval history   10/20/2016-  11/02/2016  Since discharge she has been doing well .   Brown stool since discharge -no blood in stool Hb 9.3 grams day before yesterday .    Current Outpatient Prescriptions  Medication Sig Dispense Refill  . ACCU-CHEK FASTCLIX LANCETS MISC 2 (two) times daily. for testing  0  . b complex vitamins tablet Take 1 tablet by mouth daily.    . B Complex-C-Folic Acid (TRIPHROCAPS) 1 MG CAPS   0  . bisacodyl (DULCOLAX) 5 MG EC tablet Take by mouth.    . Calcium Carbonate-Vitamin D (OS-CAL 500 + D PO) Take by mouth.    . calcium-vitamin D 250-100 MG-UNIT per tablet Take 1 tablet by mouth daily at 12 noon.     . carvedilol (COREG) 6.25 MG tablet Take 6.25 mg by mouth 2 (two) times daily with a meal.     . Cholecalciferol (VITAMIN D3) 1000 UNITS CAPS Take 1,000 Units by mouth.    . dofetilide (TIKOSYN) 125 MCG capsule Take 125 mcg by mouth 2 (two) times daily.     . ferrous gluconate (FERGON) 324 MG tablet Take 1 tablet (324 mg total) by mouth 2 (two) times daily with a meal. 60 tablet 3  . furosemide (LASIX) 20 MG tablet Take 20 mg by mouth daily as needed.     Marland Kitchen glucose blood (ACCU-CHEK AVIVA PLUS) test strip Use as instructed to check blood sugar 2 times per day dx code 250.02 100 each 3  . Magnesium 250 MG TABS Take 2 tablets by mouth 2 (two) times daily.     . metFORMIN (GLUCOPHAGE) 1000 MG tablet Take 1,000 mg by mouth daily.  0  . metFORMIN (GLUCOPHAGE) 500 MG tablet Take 500 mg by mouth 2 (two) times daily with a meal.    . nitroGLYCERIN (NITROSTAT) 0.4 MG SL tablet Place 1 tablet under tongue as needed for chest pain (may repeat every 5 minutes but seek medical help if pain persists after 3 tablets) as needed    . oxyCODONE-acetaminophen (PERCOCET/ROXICET) 5-325 MG tablet take 1 tablet by mouth every 6 hours if needed  0  . pantoprazole (PROTONIX) 40 MG tablet Take by mouth.    . rosuvastatin (CRESTOR) 10 MG tablet Take 10 mg by mouth daily.    Marland Kitchen  sitaGLIPtin (JANUVIA) 50 MG tablet Take 50 mg by mouth daily.      No current facility-administered medications for this visit.     Allergies as of 11/02/2016 - Review Complete 10/20/2016  Allergen Reaction Noted  . Ace inhibitors Cough 06/28/2011  . Amiodarone Nausea Only and Nausea And Vomiting 07/22/2016    ROS:  General: Negative for anorexia, weight loss, fever, chills, fatigue, weakness. ENT: Negative for hoarseness, difficulty swallowing , nasal congestion. CV: Negative for chest pain, angina, palpitations, dyspnea on exertion, peripheral edema.  Respiratory: Negative for dyspnea at rest, dyspnea on exertion, cough, sputum, wheezing.  GI: See history of present illness. GU:  Negative for dysuria, hematuria, urinary incontinence, urinary frequency, nocturnal urination.  Endo: Negative for unusual weight change.    Physical Examination:   There were no vitals taken for this  visit.  General: Well-nourished, well-developed in no acute distress.  Eyes: No icterus. Conjunctivae pink. Mouth: Oropharyngeal mucosa moist and pink , no lesions erythema or exudate. Lungs: Clear to auscultation bilaterally. Non-labored. Heart: Regular rate and rhythm, no murmurs rubs or gallops.  Abdomen: Bowel sounds are normal, nontender, nondistended, no hepatosplenomegaly or masses, no abdominal bruits or hernia , no rebound or guarding.   Extremities: No lower extremity edema. No clubbing or deformities. Neuro: Alert and oriented x 3.  Grossly intact. Skin: Warm and dry, no jaundice.   Psych: Alert and cooperative, normal mood and affect.  Labs:  CBC Latest Ref Rng & Units 10/20/2016 10/20/2016 10/19/2016  WBC 3.6 - 11.0 K/uL - - -  Hemoglobin 12.0 - 16.0 g/dL 9.1(L) 9.0(L) 9.4(L)  Hematocrit 35.0 - 47.0 % - - -  Platelets 150 - 440 K/uL - - -   BMP Latest Ref Rng & Units 10/20/2016 10/19/2016 09/11/2013  Glucose 65 - 99 mg/dL 130(H) 93 140(H)  BUN 6 - 20 mg/dL 16 20 25(H)  Creatinine 0.44 - 1.00 mg/dL 0.94 0.76 1.2  Sodium 135 - 145 mmol/L 137 139 134(L)  Potassium 3.5 - 5.1 mmol/L 4.0 4.1 4.5  Chloride 101 - 111 mmol/L 108 109 103  CO2 22 - 32 mmol/L '26 26 25  '$ Calcium 8.9 - 10.3 mg/dL 8.7(L) 8.8(L) 9.4    Imaging Studies: No results found.  Assessment and Plan:   April Park is a 83 y.o. y/o female here to follow up for Iron deficiency anemia, duodenal AVM s/p ablation . She has been on Xarelto.  I explained that since we did see an AVM in the duodenum there is always a possibility of colonic and small bowel AVM's and if Xarelto is being planned to bne restarted then may benefit from a colonoscopy followed by a capsule study .    Plan  1.Continue oral iron  2. Colonoscopy and followed by capsule study   I have discussed alternative options, risks & benefits,  which include, but are not limited to, bleeding, infection, perforation,respiratory complication & drug reaction.   The patient agrees with this plan & written consent will be obtained.     Dr [name]  MD Follow up in 6 weeks

## 2016-11-02 NOTE — Telephone Encounter (Signed)
11/02/16 2:20 pm [employer] automated system NO prior auth is required for a person who has MCR as primary.

## 2016-11-03 ENCOUNTER — Telehealth: Payer: Self-pay | Admitting: Gastroenterology

## 2016-11-03 ENCOUNTER — Telehealth: Payer: Self-pay

## 2016-11-03 NOTE — Telephone Encounter (Signed)
Patient is returning your call and would like for you to call him first thing in the morning

## 2016-11-03 NOTE — Telephone Encounter (Signed)
Calling patient to advise per Dr. Humphrey Rolls clearance received.  Also no need to stop Defetilide, entresto, or carveilol prior to procedure.   No blood thinners being taken.   No answer. No vm setup.

## 2016-11-04 ENCOUNTER — Encounter: Payer: Self-pay | Admitting: *Deleted

## 2016-11-07 ENCOUNTER — Encounter: Payer: Self-pay | Admitting: *Deleted

## 2016-11-07 ENCOUNTER — Ambulatory Visit
Admission: RE | Admit: 2016-11-07 | Discharge: 2016-11-07 | Disposition: A | Discharge status: Home / Self Care | Payer: Medicare Other | Source: Ambulatory Visit | Attending: Gastroenterology | Admitting: Gastroenterology

## 2016-11-07 ENCOUNTER — Encounter
Admission: RE | Disposition: A | Discharge status: Home / Self Care | Payer: Self-pay | Source: Ambulatory Visit | Attending: Gastroenterology

## 2016-11-07 ENCOUNTER — Ambulatory Visit: Payer: Medicare Other | Admitting: Anesthesiology

## 2016-11-07 DIAGNOSIS — I4891 Unspecified atrial fibrillation: Secondary | ICD-10-CM | POA: Insufficient documentation

## 2016-11-07 DIAGNOSIS — I509 Heart failure, unspecified: Secondary | ICD-10-CM | POA: Diagnosis not present

## 2016-11-07 DIAGNOSIS — K219 Gastro-esophageal reflux disease without esophagitis: Secondary | ICD-10-CM | POA: Insufficient documentation

## 2016-11-07 DIAGNOSIS — Z79899 Other long term (current) drug therapy: Secondary | ICD-10-CM | POA: Insufficient documentation

## 2016-11-07 DIAGNOSIS — D509 Iron deficiency anemia, unspecified: Secondary | ICD-10-CM

## 2016-11-07 DIAGNOSIS — E119 Type 2 diabetes mellitus without complications: Secondary | ICD-10-CM | POA: Insufficient documentation

## 2016-11-07 DIAGNOSIS — I2510 Atherosclerotic heart disease of native coronary artery without angina pectoris: Secondary | ICD-10-CM | POA: Diagnosis not present

## 2016-11-07 DIAGNOSIS — I110 Hypertensive heart disease with heart failure: Secondary | ICD-10-CM | POA: Diagnosis not present

## 2016-11-07 DIAGNOSIS — K648 Other hemorrhoids: Secondary | ICD-10-CM | POA: Diagnosis not present

## 2016-11-07 DIAGNOSIS — E785 Hyperlipidemia, unspecified: Secondary | ICD-10-CM | POA: Insufficient documentation

## 2016-11-07 DIAGNOSIS — Z7984 Long term (current) use of oral hypoglycemic drugs: Secondary | ICD-10-CM | POA: Insufficient documentation

## 2016-11-07 HISTORY — PX: GIVENS CAPSULE STUDY: SHX5432

## 2016-11-07 HISTORY — PX: COLONOSCOPY WITH PROPOFOL: SHX5780

## 2016-11-07 HISTORY — DX: Other specified postprocedural states: Z98.890

## 2016-11-07 HISTORY — DX: Gastrointestinal hemorrhage, unspecified: K92.2

## 2016-11-07 LAB — GLUCOSE, CAPILLARY
Glucose-Capillary: 109 mg/dL — ABNORMAL HIGH (ref 65–99)
Glucose-Capillary: 101 mg/dL — ABNORMAL HIGH (ref 65–99)

## 2016-11-07 SURGERY — COLONOSCOPY WITH PROPOFOL
Anesthesia: General

## 2016-11-07 MED ORDER — PROPOFOL 10 MG/ML IV BOLUS
INTRAVENOUS | Status: DC | PRN
  Administered 2016-11-07: 70 mg via INTRAVENOUS

## 2016-11-07 MED ORDER — LIDOCAINE HCL 2 % IJ SOLN
INTRAMUSCULAR | Status: AC
  Filled 2016-11-07: qty 10

## 2016-11-07 MED ORDER — PHENYLEPHRINE HCL 10 MG/ML IJ SOLN
INTRAMUSCULAR | Status: AC
  Filled 2016-11-07: qty 1

## 2016-11-07 MED ORDER — LACTATED RINGERS IV SOLN
INTRAVENOUS | Status: DC | PRN
  Administered 2016-11-07: 07:00:00 via INTRAVENOUS

## 2016-11-07 MED ORDER — PROPOFOL 500 MG/50ML IV EMUL
INTRAVENOUS | Status: DC | PRN
  Administered 2016-11-07: 150 ug/kg/min via INTRAVENOUS

## 2016-11-07 MED ORDER — PROPOFOL 500 MG/50ML IV EMUL
INTRAVENOUS | Status: AC
  Filled 2016-11-07: qty 50

## 2016-11-07 MED ORDER — LIDOCAINE HCL (CARDIAC) 20 MG/ML IV SOLN
INTRAVENOUS | Status: DC | PRN
  Administered 2016-11-07: 60 mg via INTRAVENOUS

## 2016-11-07 MED ORDER — EPHEDRINE SULFATE 50 MG/ML IJ SOLN
INTRAMUSCULAR | Status: DC | PRN
  Administered 2016-11-07: 10 mg via INTRAVENOUS

## 2016-11-07 MED ORDER — EPHEDRINE SULFATE 50 MG/ML IJ SOLN
INTRAMUSCULAR | Status: AC
  Filled 2016-11-07: qty 1

## 2016-11-07 NOTE — H&P (Signed)
[name] MD [address], [place], [place] [address] Phone: [phone] Fax : [phone]  Primary Care Physician:  Jodi Marble, MD Primary Gastroenterologist:  Dr. [name]   Pre-Procedure History & Physical: HPI:  April Park is a 83 y.o. female is here for an colonoscopy.   Past Medical History:  Diagnosis Date  . A-fib ([place])    failed amio due to Gi side effects (in [place])   . CAD (coronary artery disease)    non-obstructive CAD on cath January 2012. mLAD 40-50%  . Chest pain   . CHF (congestive heart failure) ([place])    due to recurrent NICM echo 06/2010: EF 20-25% mod-severe MR, moderate TR  . Diabetes mellitus   . GI bleed   . Hyperlipidemia   . Hypertension   . S/P atrioventricular nodal ablation     Past Surgical History:  Procedure Laterality Date  . CARDIAC CATHETERIZATION    . ESOPHAGOGASTRODUODENOSCOPY (EGD) WITH PROPOFOL N/A 10/20/2016   Procedure: ESOPHAGOGASTRODUODENOSCOPY (EGD) WITH PROPOFOL;  Surgeon: [name], MD;  Location: [facility] ENDOSCOPY;  Service: Endoscopy;  Laterality: N/A;  . KNEE ARTHROSCOPY     2009    Prior to Admission medications   Medication Sig Start Date End Date Taking? Authorizing Provider  b complex vitamins tablet Take 1 tablet by mouth daily.   Yes [provider]  B Complex-C-Folic Acid (TRIPHROCAPS) 1 MG CAPS  07/26/16  Yes [provider]  Calcium Carbonate-Vitamin D (OS-CAL 500 + D PO) Take by mouth. 06/23/10  Yes [provider]  calcium-vitamin D 250-100 MG-UNIT per tablet Take 1 tablet by mouth daily at 12 noon.    Yes [provider]  carvedilol (COREG) 6.25 MG tablet Take 6.25 mg by mouth 2 (two) times daily with a meal.    Yes [provider]  Cholecalciferol (VITAMIN D3) 1000 UNITS CAPS Take 1,000 Units by mouth.   Yes [provider]  dofetilide (TIKOSYN) 125 MCG capsule Take 125 mcg by mouth 2 (two) times daily.   Yes [provider]    ferrous gluconate (FERGON) 324 MG tablet Take 1 tablet (324 mg total) by mouth 2 (two) times daily with a meal. 10/20/16  Yes Theodoro Grist, MD  furosemide (LASIX) 20 MG tablet Take 20 mg by mouth daily as needed.    Yes [provider]  glimepiride (AMARYL) 1 MG tablet Take 1 mg by mouth daily with breakfast.   Yes [provider]  Magnesium 250 MG TABS Take 2 tablets by mouth 2 (two) times daily.    Yes [provider]  metFORMIN (GLUCOPHAGE) 1000 MG tablet Take 1,000 mg by mouth daily. 08/12/16  Yes [provider]  metFORMIN (GLUCOPHAGE) 500 MG tablet Take 500 mg by mouth 2 (two) times daily with a meal.   Yes [provider]  pantoprazole (PROTONIX) 40 MG tablet Take by mouth.   Yes [provider]  rosuvastatin (CRESTOR) 10 MG tablet Take 10 mg by mouth daily.   Yes [provider]  sacubitril-valsartan (ENTRESTO) 24-26 MG Take 1 tablet by mouth 2 (two) times daily.   Yes [provider]  sitaGLIPtin (JANUVIA) 50 MG tablet Take 50 mg by mouth daily.    Yes [provider]  ACCU-CHEK FASTCLIX LANCETS MISC 2 (two) times daily. for testing 10/05/16   [provider]  bisacodyl (DULCOLAX) 5 MG EC tablet Take by mouth.    [provider]  glucose blood (ACCU-CHEK AVIVA PLUS)  test strip Use as instructed to check blood sugar 2 times per day dx code 250.02 09/11/13   Elayne Snare, MD  nitroGLYCERIN (NITROSTAT) 0.4 MG SL tablet Place 1 tablet under tongue as needed for chest pain (may repeat every 5 minutes but seek medical help if pain persists after 3 tablets) as needed 09/01/10   [provider]  oxyCODONE-acetaminophen (PERCOCET/ROXICET) 5-325 MG tablet take 1 tablet by mouth every 6 hours if needed 08/11/16   [provider]    Allergies as of 11/02/2016 - Review Complete 11/02/2016  Allergen Reaction Noted  . Ace inhibitors Cough 06/28/2011  . Amiodarone Nausea Only and Nausea And  Vomiting 07/22/2016    Family History  Problem Relation Age of Onset  . Arrhythmia Mother 33  . Heart failure Mother        diastolic dysfunction  . Diabetes Sister        family history  . Hypertension Unknown        family history    Social History   Social History  . Marital status: Married    Spouse name: N/A  . Number of children: N/A  . Years of education: N/A   Occupational History  . Not on file.   Social History Main Topics  . Smoking status: Never Smoker  . Smokeless tobacco: Never Used  . Alcohol use No  . Drug use: No  . Sexual activity: Not on file   Other Topics Concern  . Not on file   Social History Narrative   Retired    Married    Tobacco Use - No.    Alcohol Use - no   Regular Exercise - no   Drug Use - no   Smoking Status:  never   Does Patient Exercise:  no   Drug Use:  no    Review of Systems: See HPI, otherwise negative ROS  Physical Exam: BP 104/63   Pulse 61   Temp 97.3 F (36.3 C) (Tympanic)   SpO2 100%  General:   Alert,  pleasant and cooperative in NAD Head:  Normocephalic and atraumatic. Neck:  Supple; no masses or thyromegaly. Lungs:  Clear throughout to auscultation.    Heart:  Regular rate and rhythm. Abdomen:  Soft, nontender and nondistended. Normal bowel sounds, without guarding, and without rebound.   Neurologic:  Alert and  oriented x4;  grossly normal neurologically.  Impression/Plan: April Park is here for an colonoscopy to be performed for iron deficiency anemia   Risks, benefits, limitations, and alternatives regarding  colonoscopy have been reviewed with the patient.  Questions have been answered.  All parties agreeable.   [name], MD  11/07/2016, 7:34 AM

## 2016-11-07 NOTE — Anesthesia Postprocedure Evaluation (Signed)
Anesthesia Post Note  Patient: April Park  Procedure(s) Performed: Procedure(s) (LRB): COLONOSCOPY WITH PROPOFOL (N/A) GIVENS CAPSULE STUDY (N/A)  Patient location during evaluation: Endoscopy Anesthesia Type: General Level of consciousness: awake and alert Pain management: pain level controlled Vital Signs Assessment: post-procedure vital signs reviewed and stable Respiratory status: spontaneous breathing and respiratory function stable Cardiovascular status: stable Anesthetic complications: no     Last Vitals:  Vitals:   11/07/16 0808 11/07/16 0817  BP: (!) 96/51 (!) 103/59  Pulse: 66 65  Resp: 13 13  Temp:      Last Pain:  Vitals:   11/07/16 0808  TempSrc: Tympanic                 [name] K

## 2016-11-07 NOTE — Anesthesia Procedure Notes (Signed)
Date/Time: 11/07/2016 7:45 AM Performed by: Hedda Slade Pre-anesthesia Checklist: Patient identified, Emergency Drugs available, Suction available and Patient being monitored Oxygen Delivery Method: Nasal cannula

## 2016-11-07 NOTE — Anesthesia Post-op Follow-up Note (Cosign Needed)
Anesthesia QCDR form completed.        

## 2016-11-07 NOTE — Transfer of Care (Signed)
Immediate Anesthesia Transfer of Care Note  Patient: April Park  Procedure(s) Performed: Procedure(s): COLONOSCOPY WITH PROPOFOL (N/A) GIVENS CAPSULE STUDY (N/A)  Patient Location: PACU  Anesthesia Type:General  Level of Consciousness: awake, alert  and oriented  Airway & Oxygen Therapy: Patient Spontanous Breathing and Patient connected to nasal cannula oxygen  Post-op Assessment: Report given to RN and Post -op Vital signs reviewed and stable  Post vital signs: Reviewed and stable  Last Vitals:  Vitals:   11/07/16 0715 11/07/16 0808  BP: 104/63 (!) 96/51  Pulse: 61 66  Resp:  13  Temp: 36.3 C 36.1 C    Last Pain:  Vitals:   11/07/16 0808  TempSrc: Tympanic         Complications: No apparent anesthesia complications

## 2016-11-07 NOTE — Op Note (Signed)
[facility] Patient Name: Angelette Ganus Procedure Date: 11/07/2016 7:43 AM MRN: [id number] Account #: [id number] Date of Birth: [birth year] Admit Type: Outpatient Age: 83 Room: [facility] ENDO ROOM 4 Gender: Female Note Status: Finalized Procedure:            Colonoscopy Indications:          Iron deficiency anemia Providers:            [name] MD, MD Referring MD:         Venetia Maxon. Elijio Miles, MD (Referring MD) Medicines:            Monitored Anesthesia Care Complications:        No immediate complications. Procedure:            Pre-Anesthesia Assessment:                       - Prior to the procedure, a History and Physical was                        performed, and patient medications, allergies and                        sensitivities were reviewed. The patient's tolerance of                        previous anesthesia was reviewed.                       - The risks and benefits of the procedure and the                        sedation options and risks were discussed with the                        patient. All questions were answered and informed                        consent was obtained.                       - ASA Grade Assessment: III - A patient with severe                        systemic disease.                       After obtaining informed consent, the colonoscope was                        passed under direct vision. Throughout the procedure,                        the patient's blood pressure, pulse, and oxygen                        saturations were monitored continuously. The                        Colonoscope was introduced through the anus and  advanced to the the terminal ileum. The colonoscopy was                        performed with ease. The patient tolerated the                        procedure well. The quality of the bowel preparation                        was excellent. Findings:      The perianal and  digital rectal examinations were normal.      Non-bleeding internal hemorrhoids were found during retroflexion. The       hemorrhoids were large and Grade I (internal hemorrhoids that do not       prolapse).      The exam was otherwise without abnormality on direct and retroflexion       views. Impression:           - Non-bleeding internal hemorrhoids.                       - The examination was otherwise normal on direct and                        retroflexion views.                       - No specimens collected. Recommendation:       - Discharge patient to home (with escort).                       - NPO for 4 hours.                       - To visualize the small bowel, perform video capsule                        endoscopy today.                       - Return to my office PRN. Procedure Code(s):    --- Professional ---                       [phone], Colonoscopy, flexible; diagnostic, including                        collection of specimen(s) by brushing or washing, when                        performed (separate procedure) Diagnosis Code(s):    --- Professional ---                       K64.0, First degree hemorrhoids                       D50.9, Iron deficiency anemia, unspecified CPT copyright 2016 American Medical Association. All rights reserved. The codes documented in this report are preliminary and upon coder review may  be revised to meet current compliance requirements. [name], MD [name] MD, MD 11/07/2016 8:06:25 AM This report has been signed electronically. Number of Addenda: 0 Note Initiated On: 11/07/2016 7:43 AM  Scope Withdrawal Time: 0 hours 12 minutes 14 seconds  Total Procedure Duration: 0 hours 15 minutes 38 seconds       [facility]

## 2016-11-07 NOTE — Anesthesia Preprocedure Evaluation (Signed)
Anesthesia Evaluation  Patient identified by MRN, date of birth, ID band Patient awake    Reviewed: Allergy & Precautions, NPO status , Patient's Chart, lab work & pertinent test results  History of Anesthesia Complications Negative for: history of anesthetic complications  Airway Mallampati: II       Dental   Pulmonary neg pulmonary ROS,           Cardiovascular hypertension, Pt. on medications and Pt. on home beta blockers + CAD and +CHF (EF about 40 %)  + dysrhythmias (hx of Afib, s/p ablation in [place].)      Neuro/Psych negative neurological ROS     GI/Hepatic Neg liver ROS, GERD  Medicated,  Endo/Other  diabetes, Type 2, Oral Hypoglycemic Agents  Renal/GU Renal InsufficiencyRenal disease     Musculoskeletal   Abdominal   Peds  Hematology  (+) anemia ,   Anesthesia Other Findings   Reproductive/Obstetrics                             Anesthesia Physical Anesthesia Plan  ASA: III  Anesthesia Plan: General   Post-op Pain Management:    Induction: Intravenous  Airway Management Planned: Nasal Cannula  Additional Equipment:   Intra-op Plan:   Post-operative Plan:   Informed Consent: I have reviewed the patients History and Physical, chart, labs and discussed the procedure including the risks, benefits and alternatives for the proposed anesthesia with the patient or authorized representative who has indicated his/her understanding and acceptance.     Plan Discussed with:   Anesthesia Plan Comments:         Anesthesia Quick Evaluation

## 2016-11-10 ENCOUNTER — Encounter: Payer: Self-pay | Admitting: Gastroenterology

## 2016-11-10 ENCOUNTER — Other Ambulatory Visit: Payer: Self-pay

## 2016-11-10 MED ORDER — ALBENDAZOLE 200 MG PO TABS: 400.0000 mg | ORAL_TABLET | Freq: Once | ORAL | 0 refills | Status: AC

## 2017-02-09 ENCOUNTER — Telehealth: Payer: Self-pay

## 2017-02-09 NOTE — Telephone Encounter (Signed)
Phone number was unreachable per message.   Calling patient to schedule re-check labs of Stool O&P.  Sent Dr. Vicente Males message to advise unable to contact patient for follow-up.

## 2017-04-14 DIAGNOSIS — Z5181 Encounter for therapeutic drug level monitoring: Secondary | ICD-10-CM | POA: Insufficient documentation

## 2017-12-19 LAB — HM DIABETES EYE EXAM

## 2017-12-20 ENCOUNTER — Ambulatory Visit: Payer: Medicare Other | Admitting: Endocrinology

## 2017-12-20 DIAGNOSIS — Z0289 Encounter for other administrative examinations: Secondary | ICD-10-CM

## 2017-12-20 NOTE — Progress Notes (Deleted)
Patient ID: April Park, female   DOB: 07-Mar-1932, 84 y.o.   MRN: [id number]   Reason for Appointment: Diabetes follow-up   History of Present Illness   Diagnosis: Type 2 DIABETES MELITUS, date of diagnosis:  1998     Previous history: She has been on metformin and subsequently Amaryl as initial treatments with usually fair control In 2011 because of relatively high readings Januvia was added Her last visit was in 07/2010 and even though her A1c was 7.6 she did not appear to have significant high blood sugars at home  Recent history: She was seen in 1/15 for followup after a hiatus of 3 years Because of her A1c of 8% her metformin was increased to 1500 mg. Previously her metformin dose had been limited because of borderline renal function. She had no side effects from metformin and no fatigue or lethargy. She was continued on Januvia and Amaryl also She has been checking her blood sugars more frequently recently; previously was using expired test strips on her One Touch monitor However she is checking blood sugars mostly in the morning and these appear to be excellent No recent labs available Also has not been doing any exercise because of not being over to go out on her own She does report occasional symptoms of hypoglycemia in the afternoon and has only one documented low reading of 54 at 2:30 p.m. Not checking any readings after dinner     Oral hypoglycemic drugs:Amaryl, Januvia, metformin         Side effects from medications: None Proper timing of medications in relation to meals: Yes.          Monitors blood glucose: Once a day.    Glucometer:  Accu-Chek         Blood Glucose readings from download:  Mean values apply above for all meters except median for One Touch  PRE-MEAL Fasting Lunch Dinner Bedtime Overall  Glucose range:       Mean/median:        POST-MEAL PC Breakfast PC Lunch PC Dinner  Glucose range:     Mean/median:       Glucose before breakfast: 71-147 with  average 110, midday average 103, early afternoon averaging 94 Overall average 104 with 26 readings and overall range 54-156  Hypoglycemia: As above, only one low reading on her 30 day download         Meals: 3 meals per day. At breakfast she usually has 2 slices of bread or a fried roti,  often no protein. At lunch and dinner usually has meat with rotis, may have yogurt  also at times : Avoiding rice          Physical activity: exercise: None recently but previously fairly [place]           Dietician visit:  never  Weight control:  Wt Readings from Last 3 Encounters:  11/02/16 154 lb 3.2 oz (69.9 kg)  10/20/16 150 lb 4.8 oz (68.2 kg)  09/11/13 154 lb 9.6 oz (70.1 kg)          Complications:  none    Diabetes labs:  Lab Results  Component Value Date   HGBA1C 6.7 (H) 10/18/2016   HGBA1C 7.4 (H) 09/11/2013   HGBA1C 8.0 (A) 07/05/2013   Lab Results  Component Value Date   LDLCALC 123 07/05/2013   CREATININE 0.94 10/20/2016     Allergies as of 12/20/2017      Reactions  Ace Inhibitors Cough   Amiodarone Nausea Only, Nausea And Vomiting   Has tried med on 2 different occasions with same reaction       Medication List        Accurate as of 12/20/17  9:43 AM. Always use your most recent med list.          ACCU-CHEK FASTCLIX LANCETS Misc 2 (two) times daily. for testing   b complex vitamins tablet Take 1 tablet by mouth daily.   bisacodyl 5 MG EC tablet Commonly known as:  DULCOLAX Take by mouth.   calcium-vitamin D 250-100 MG-UNIT tablet Take 1 tablet by mouth daily at 12 noon.   carvedilol 6.25 MG tablet Commonly known as:  COREG Take 6.25 mg by mouth 2 (two) times daily with a meal.   dofetilide 125 MCG capsule Commonly known as:  TIKOSYN Take 125 mcg by mouth 2 (two) times daily.   ENTRESTO 24-26 MG Generic drug:  sacubitril-valsartan Take 1 tablet by mouth 2 (two) times daily.   ferrous gluconate 324 MG tablet Commonly known as:   FERGON Take 1 tablet (324 mg total) by mouth 2 (two) times daily with a meal.   furosemide 20 MG tablet Commonly known as:  LASIX Take 20 mg by mouth daily as needed.   glimepiride 1 MG tablet Commonly known as:  AMARYL Take 1 mg by mouth daily with breakfast.   glucose blood test strip Commonly known as:  ACCU-CHEK AVIVA PLUS Use as instructed to check blood sugar 2 times per day dx code 250.02   Magnesium 250 MG Tabs Take 2 tablets by mouth 2 (two) times daily.   metFORMIN 500 MG tablet Commonly known as:  GLUCOPHAGE Take 500 mg by mouth 2 (two) times daily with a meal.   metFORMIN 1000 MG tablet Commonly known as:  GLUCOPHAGE Take 1,000 mg by mouth daily.   nitroGLYCERIN 0.4 MG SL tablet Commonly known as:  NITROSTAT Place 1 tablet under tongue as needed for chest pain (may repeat every 5 minutes but seek medical help if pain persists after 3 tablets) as needed   OS-CAL 500 + D PO Take by mouth.   oxyCODONE-acetaminophen 5-325 MG tablet Commonly known as:  PERCOCET/ROXICET take 1 tablet by mouth every 6 hours if needed   pantoprazole 40 MG tablet Commonly known as:  PROTONIX Take by mouth.   rosuvastatin 10 MG tablet Commonly known as:  CRESTOR Take 10 mg by mouth daily.   sitaGLIPtin 50 MG tablet Commonly known as:  JANUVIA Take 50 mg by mouth daily.   TRIPHROCAPS 1 MG Caps   Vitamin D3 1000 units Caps Take 1,000 Units by mouth.       Allergies:  Allergies  Allergen Reactions  . Ace Inhibitors Cough  . Amiodarone Nausea Only and Nausea And Vomiting    Has tried med on 2 different occasions with same reaction     Past Medical History:  Diagnosis Date  . A-fib ([place])    failed amio due to Gi side effects (in [place])   . CAD (coronary artery disease)    non-obstructive CAD on cath January 2012. mLAD 40-50%  . Chest pain   . CHF (congestive heart failure) ([place])    due to recurrent NICM echo 06/2010: EF 20-25% mod-severe MR, moderate TR  .  Diabetes mellitus   . GI bleed   . Hyperlipidemia   . Hypertension   . S/P atrioventricular nodal ablation     Past Surgical  History:  Procedure Laterality Date  . CARDIAC CATHETERIZATION    . COLONOSCOPY WITH PROPOFOL N/A 11/07/2016   Procedure: COLONOSCOPY WITH PROPOFOL;  Surgeon: [name], MD;  Location: [facility] ENDOSCOPY;  Service: Endoscopy;  Laterality: N/A;  . ESOPHAGOGASTRODUODENOSCOPY (EGD) WITH PROPOFOL N/A 10/20/2016   Procedure: ESOPHAGOGASTRODUODENOSCOPY (EGD) WITH PROPOFOL;  Surgeon: [name], MD;  Location: [facility] ENDOSCOPY;  Service: Endoscopy;  Laterality: N/A;  . GIVENS CAPSULE STUDY N/A 11/07/2016   Procedure: GIVENS CAPSULE STUDY;  Surgeon: [name], MD;  Location: [facility] ENDOSCOPY;  Service: Endoscopy;  Laterality: N/A;  . KNEE ARTHROSCOPY     2009    Family History  Problem Relation Age of Onset  . Arrhythmia Mother 56  . Heart failure Mother        diastolic dysfunction  . Diabetes Sister        family history  . Hypertension Unknown        family history    Social History:  reports that she has never smoked. She has never used smokeless tobacco. She reports that she does not drink alcohol or use drugs.  Review of Systems:  History of atrial fibrillation and chronic systolic heart failure. Last ejection fraction was 58%  Has had cataract surgery  Hypertension: well controlled with multiple medications, followed by cardiologist    Lipids: She was asked to resume her Crestor on her last visit. Previous LDL was 123 with HDL 62 and triglycerides 60  She had been complaining of muscle cramps especially at night     Diabetic Foot exam done in 1/15  LABS:  Pending   Examination:   There were no vitals taken for this visit.  There is no height or weight on file to calculate BMI.   No ankle edema   ASSESSMENT/ PLAN:    Diabetes type 2:  Blood glucose control appears excellent now with increasing her metformin to a total of 1500 mg a day Also  she is having tendency to mild hypoglycemia in the afternoon Not checking readings after her evening meal and mostly in the morning  Encouraged her to check readings after dinner at times and not necessarily in the morning She can stop her morning Amaryl to avoid daytime hypoglycemia To continue same doses of metformin as well as renal function is stable  A1c to be checked today She will followup in 2-3 months depending on her travel plans  Hypercholesterolemia: To be followed by PCP and cardiologist  Elayne Snare 12/20/2017, 9:43 AM

## 2019-06-19 LAB — BASIC METABOLIC PANEL: Creatinine: 1.2 — AB (ref 0.5–1.1)

## 2019-06-19 LAB — HEMOGLOBIN A1C: Hemoglobin A1C: 6.7

## 2019-06-19 LAB — LIPID PANEL: LDL Cholesterol: 104

## 2019-07-02 ENCOUNTER — Other Ambulatory Visit: Payer: Self-pay

## 2019-07-02 ENCOUNTER — Ambulatory Visit (INDEPENDENT_AMBULATORY_CARE_PROVIDER_SITE_OTHER): Payer: Medicare Other | Admitting: Endocrinology

## 2019-07-02 ENCOUNTER — Encounter: Payer: Self-pay | Admitting: Endocrinology

## 2019-07-02 VITALS — BP 110/62 | HR 67 | Temp 98.1°F | Wt 149.2 lb

## 2019-07-02 DIAGNOSIS — E1165 Type 2 diabetes mellitus with hyperglycemia: Secondary | ICD-10-CM | POA: Diagnosis not present

## 2019-07-02 DIAGNOSIS — E1142 Type 2 diabetes mellitus with diabetic polyneuropathy: Secondary | ICD-10-CM

## 2019-07-02 DIAGNOSIS — E1151 Type 2 diabetes mellitus with diabetic peripheral angiopathy without gangrene: Secondary | ICD-10-CM

## 2019-07-02 MED ORDER — FARXIGA 5 MG PO TABS: 5.0000 mg | ORAL_TABLET | Freq: Every day | ORAL | 3 refills | Status: DC

## 2019-07-02 NOTE — Patient Instructions (Signed)
Check blood sugars on waking up every other day  Also check blood sugars about 2 hours after any 1 meal on a daily basis  Recommended blood sugar levels on waking up are 90-130 and about 2 hours after meal is 130-180  Please bring your blood sugar monitor to each visit, thank you  Please make sure you have some protein with lunch every day such as yogurt, low-fat cheese, peanut butter   New medication will be FARXIGA to be taken before breakfast daily  Stop Januvia when the prescription is finished   GLIMEPIRIDE/Amaryl: When starting [place] reduce the glimepiride to half tablet twice a day  Also REDUCE FUROSEMIDE to 20 mg a day with starting [place] and increase your fluid intake  Please check blood pressure weekly Your blood pressure medication may need to be reduced in consultation with your doctors

## 2019-07-02 NOTE — Progress Notes (Signed)
Patient ID: April Park, female   DOB: 1932/02/25, 86 y.o.   MRN: [id number]           Reason for Appointment: Consultation for Type 2 Diabetes  Referring physician: Dr. Neoma Laming   History of Present Illness:          Date of diagnosis of type 2 diabetes mellitus: 1998       Background history: She had been on metformin and subsequently Amaryl as initial treatments with fair control In 2011 because of relatively high readings Januvia was added Regimen has been continued unchanged subsequently Control has been variable with A1c as high as 8% in 2015  Recent history:   Records from treating physician, previous labs and medication list were reviewed in detail  Most recent A1c is 6.7 done on 06/19/2019    Non-insulin hypoglycemic drugs the patient is taking are: Amaryl 2 mg twice daily, Januvia 50 mg daily, Metformin 500 mg twice daily  Current management, blood sugar patterns and problems identified:  She checks her blood sugar fasting and occasionally in the afternoon randomly  Fasting blood sugars are generally fairly good without overnight hypoglycemia and only once has a high reading in the morning  About once a week she will feel shaky and weak before dinnertime and blood sugar may be as low as 72, this is usually if she is skipping lunch  Otherwise is eating small meals and not large amounts of carbohydrate  No blood sugars being checked after dinner which is her main balanced meal  Usually having protein like she is for breakfast   Because of her balance issues and weakness she is not able to do any walking  She does not think she is having any difficulties with weight gain  Not clear why her Januvia dose is only 50 mg, renal function has been fairly good        Side effects from medications have been: None     Meal times are:  Breakfast is at 9-10 AM lunch: Only a snack, dinner: 6 PM  Typical meal intake: Breakfast is English muffin with cheese, lunch is  usually toast with tea, dinner is Roti, meat or lentils   Snacks are mostly fruit             Exercise:  Unable to do any  Glucose monitoring:  done about 2 times a day         Glucometer: Accu-Chek       Blood Glucose readings by time of day and averages from meter download:  PREMEAL Breakfast Lunch Dinner Bedtime  Overall   Glucose range:  88-213      Median:  131     142   POST-MEAL PC Breakfast  afternoon PC Dinner  Glucose range:   72-278   Median:       Dietician consultations: None  Weight history:  Wt Readings from Last 3 Encounters:  07/02/19 149 lb 3.2 oz (67.7 kg)  11/02/16 154 lb 3.2 oz (69.9 kg)  10/20/16 150 lb 4.8 oz (68.2 kg)    Glycemic control:   Lab Results  Component Value Date   HGBA1C 6.7 06/19/2019   HGBA1C 6.7 (H) 10/18/2016   HGBA1C 7.4 (H) 09/11/2013   Lab Results  Component Value Date   LDLCALC 104 06/19/2019   CREATININE 1.2 (A) 06/19/2019   No results found for: MICRALBCREAT  No results found for: FRUCTOSAMINE  Office Visit on 07/02/2019  Component Date Value Ref Range  Status  . Creatinine 06/19/2019 1.2* 0.5 - 1.1 Final  . LDL Cholesterol 06/19/2019 104   Final  . Hemoglobin A1C 06/19/2019 6.7   Final    Allergies as of 07/02/2019      Reactions   Ace Inhibitors Cough   Amiodarone Nausea Only, Nausea And Vomiting   Has tried med on 2 different occasions with same reaction       Medication List       Accurate as of July 02, 2019  2:33 PM. If you have any questions, ask your nurse or doctor.        STOP taking these medications   b complex vitamins tablet Stopped by: Elayne Snare, MD   bisacodyl 5 MG EC tablet Commonly known as: DULCOLAX Stopped by: Elayne Snare, MD   calcium-vitamin D 250-100 MG-UNIT tablet Stopped by: Elayne Snare, MD   ferrous gluconate 324 MG tablet Commonly known as: FERGON Stopped by: Elayne Snare, MD   Magnesium 250 MG Tabs Stopped by: Elayne Snare, MD   OS-CAL 500 + D PO Stopped by: Elayne Snare, MD   oxyCODONE-acetaminophen 5-325 MG tablet Commonly known as: PERCOCET/ROXICET Stopped by: Elayne Snare, MD   pantoprazole 40 MG tablet Commonly known as: PROTONIX Stopped by: Elayne Snare, MD   rosuvastatin 10 MG tablet Commonly known as: CRESTOR Stopped by: Elayne Snare, MD   Triphrocaps 1 MG Caps Stopped by: Elayne Snare, MD   Vitamin D3 25 MCG (1000 UT) Caps Stopped by: Elayne Snare, MD     TAKE these medications   carvedilol 25 MG tablet Commonly known as: COREG Take 25 mg by mouth 2 (two) times daily with a meal. What changed: Another medication with the same name was removed. Continue taking this medication, and follow the directions you see here. Changed by: Elayne Snare, MD   dofetilide 125 MCG capsule Commonly known as: TIKOSYN Take 125 mcg by mouth 2 (two) times daily.   Entresto 24-26 MG Generic drug: sacubitril-valsartan Take 1 tablet by mouth 2 (two) times daily.   furosemide 20 MG tablet Commonly known as: LASIX Take 20 mg by mouth daily.   glimepiride 2 MG tablet Commonly known as: AMARYL Take 2 mg by mouth 2 (two) times daily. What changed: Another medication with the same name was removed. Continue taking this medication, and follow the directions you see here. Changed by: Elayne Snare, MD   glucose blood test strip 1 each by Other route 3 (three) times daily. Use OneTouch Ultra 2 test strips as instructed to check blood sugar 2-3 times daily. What changed: Another medication with the same name was removed. Continue taking this medication, and follow the directions you see here. Changed by: Elayne Snare, MD   Magnesium 500 MG Caps Take 1 capsule by mouth daily.   metFORMIN 500 MG tablet Commonly known as: GLUCOPHAGE Take 500 mg by mouth 2 (two) times daily with a meal. What changed: Another medication with the same name was removed. Continue taking this medication, and follow the directions you see here. Changed by: Elayne Snare, MD   nitroGLYCERIN  0.4 MG SL tablet Commonly known as: NITROSTAT Place 1 tablet under tongue as needed for chest pain (may repeat every 5 minutes but seek medical help if pain persists after 3 tablets) as needed   ONE TOUCH ULTRA 2 w/Device Kit 1 each by Does not apply route See admin instructions. Use OneTouch Ultra 2 to check blood sugar 2-3 times daily.   OneTouch Delica Lancets  30G Misc 1 each by Does not apply route See admin instructions. Use OneTouch Delica lancets to check blood sugar 2-3 times daily. What changed: Another medication with the same name was removed. Continue taking this medication, and follow the directions you see here. Changed by: Elayne Snare, MD   Rivaroxaban 15 MG Tabs tablet Commonly known as: XARELTO Take 15 mg by mouth daily.   sitaGLIPtin 50 MG tablet Commonly known as: JANUVIA Take 50 mg by mouth daily.       Allergies:  Allergies  Allergen Reactions  . Ace Inhibitors Cough  . Amiodarone Nausea Only and Nausea And Vomiting    Has tried med on 2 different occasions with same reaction     Past Medical History:  Diagnosis Date  . A-fib ([place])    failed amio due to Gi side effects (in [place])   . CAD (coronary artery disease)    non-obstructive CAD on cath January 2012. mLAD 40-50%  . Chest pain   . CHF (congestive heart failure) ([place])    due to recurrent NICM echo 06/2010: EF 20-25% mod-severe MR, moderate TR  . Diabetes mellitus   . GI bleed   . Hyperlipidemia   . Hypertension   . S/P atrioventricular nodal ablation     Past Surgical History:  Procedure Laterality Date  . CARDIAC CATHETERIZATION    . COLONOSCOPY WITH PROPOFOL N/A 11/07/2016   Procedure: COLONOSCOPY WITH PROPOFOL;  Surgeon: [name], MD;  Location: [facility] ENDOSCOPY;  Service: Endoscopy;  Laterality: N/A;  . ESOPHAGOGASTRODUODENOSCOPY (EGD) WITH PROPOFOL N/A 10/20/2016   Procedure: ESOPHAGOGASTRODUODENOSCOPY (EGD) WITH PROPOFOL;  Surgeon: [name], MD;  Location: [facility] ENDOSCOPY;  Service:  Endoscopy;  Laterality: N/A;  . GIVENS CAPSULE STUDY N/A 11/07/2016   Procedure: GIVENS CAPSULE STUDY;  Surgeon: [name], MD;  Location: [facility] ENDOSCOPY;  Service: Endoscopy;  Laterality: N/A;  . KNEE ARTHROSCOPY     2009    Family History  Problem Relation Age of Onset  . Arrhythmia Mother 69  . Heart failure Mother        diastolic dysfunction  . Diabetes Mother   . Diabetes Sister        family history  . Hypertension Other        family history  . Diabetes Brother     Social History:  reports that she has never smoked. She has never used smokeless tobacco. She reports that she does not drink alcohol or use drugs.   Review of Systems  Constitutional: Positive for reduced appetite.  HENT: Negative for headaches.   Eyes: Negative for blurred vision.  Respiratory:       Mild shortness of breath on exertion  Cardiovascular: Negative for leg swelling.  Gastrointestinal: Negative for diarrhea.  Endocrine: Negative for fatigue.  Genitourinary: Negative for frequency.  Musculoskeletal: Positive for joint pain, muscle aches and muscle cramps.       She has pain around her shoulders with limited movement  Skin: Negative for rash.  Neurological: Positive for weakness and balance difficulty. Negative for numbness.       Has some burning on the soles of the feet, not affecting sleep.  Also feet sensitive to touch  No history of thyroid disease Has had normal vitamin D and B12 levels previously   Lipid history: On Crestor, not sure if she is taking 10 or 20 mg, last LDL 104.    Lab Results  Component Value Date   LDLCALC 104 06/19/2019  Hypertension: Has been treated with carvedilol, also on Entresto for cardiomyopathy  BP Readings from Last 3 Encounters:  07/02/19 110/62  11/07/16 136/75  11/02/16 110/73    Most recent eye exam was in [place] 2019  Most recent foot exam: 1/21  Currently known complications of diabetes: Neuropathy  LABS:  Office  Visit on 07/02/2019  Component Date Value Ref Range Status  . Creatinine 06/19/2019 1.2* 0.5 - 1.1 Final  . LDL Cholesterol 06/19/2019 104   Final  . Hemoglobin A1C 06/19/2019 6.7   Final    Physical Examination:  BP 110/62 (BP Location: Right Arm, Patient Position: Sitting, Cuff Size: Normal)   Pulse 67   Temp 98.1 F (36.7 C)   Wt 149 lb 3.2 oz (67.7 kg)   SpO2 97%   BMI 28.19 kg/m   GENERAL:    HEENT:         Eye exam shows normal external appearance.  Fundus exam shows no retinopathy.  Oral exam    NECK:   There is no lymphadenopathy  Thyroid is not enlarged and no nodules felt.   Carotids are normal to palpation and no bruit heard  LUNGS:         Chest is symmetrical. Lungs are clear to auscultation.Marland Kitchen   HEART:         Heart sounds:  S1 and S2 are normal. No murmur or click heard., no S3 or S4.   ABDOMEN:   There is no distention present. Liver and spleen are not palpable.  No other mass or tenderness present.    NEUROLOGICAL:   Ankle jerks are absent bilaterally.    Diabetic Foot Exam - Simple   Simple Foot Form Diabetic Foot exam was performed with the following findings: Yes 07/02/2019 12:11 PM  Visual Inspection No deformities, no ulcerations, no other skin breakdown bilaterally: Yes Sensation Testing Intact to touch and monofilament testing bilaterally: Yes Pulse Check See comments: Yes Comments Absent pedal pulses             Vibration sense is  reduced in distal first toes.  MUSCULOSKELETAL:  There is no swelling or deformity of the peripheral joints.     EXTREMITIES:     There is no ankle edema.  SKIN:       No rash or lesions of concern.        ASSESSMENT:  Diabetes type 2  See history of present illness for detailed discussion of current diabetes management, blood sugar patterns and problems identified  Most recent A1c is 6.7 which is adequate  Current treatment regimen is Amaryl, Januvia and Metformin  Although blood sugars are  generally controlled she has occasional tendency to hypoglycemia in the afternoon likely to be from not eating a full lunch or getting any protein midday Also not checking blood sugars after evening meal Considering her age and comorbid conditions her overall control is excellent but needs to avoid hypoglycemia Her diet is reasonably good but she is not able to exercise  Complications of diabetes: Mild neuropathy without sensory loss  Gait imbalance of unclear etiology, does not appear to have decreased proprioception and she will discuss her PCP, consider neurology consultation also  History of coronary artery disease, hyperlipidemia, pacemaker insertion and nonischemic cardiomyopathy  PLAN:    . Glucose monitoring: Patient advised to check readings either fasting or 2 hours after meals including some after breakfast or dinner and given written information on blood sugar targets both fasting and after meals  .  Diabetes education: Diet discussed today  . Lifestyle changes: Dietary changes: She will need to have some protein at lunch and discussed options such as yogurt, peanut butter or cheese  . Therapy changes: She is a good candidate for medication like Wilder Glade because of her history of impaired cardiac function Discussed action of SGLT 2 drugs on lowering glucose by decreasing kidney absorption of glucose, benefits of weight loss and lower blood pressure, possible side effects including candidiasis and dosage regimen  She will start with 5 mg daily She will need to reduce her Amaryl to half tablet twice daily to prevent hypoglycemia  Also likely can stop Januvia when her supply is finished as this may not be as effective especially with the 50 mg dose Discussed reducing Lasix to 20 mg but also she will need to work with her cardiologist to adjust her blood pressure medications if needed She will check her blood pressure regularly at home   . Preventive care needed: Urine  microalbumin on the next visit    Follow-up: 4 weeks, she will try to get BMP done prior to coming here  Patient's treatment plan was discussed with her son who is also her treating cardiologist   Patient Instructions  Check blood sugars on waking up every other day  Also check blood sugars about 2 hours after any 1 meal on a daily basis  Recommended blood sugar levels on waking up are 90-130 and about 2 hours after meal is 130-180  Please bring your blood sugar monitor to each visit, thank you  Please make sure you have some protein with lunch every day such as yogurt, low-fat cheese, peanut butter   New medication will be FARXIGA to be taken before breakfast daily  Stop Januvia when the prescription is finished   GLIMEPIRIDE/Amaryl: When starting [place] reduce the glimepiride to half tablet twice a day  Also REDUCE FUROSEMIDE to 20 mg a day with starting Farxiga and increase your fluid intake  Please check blood pressure weekly Your blood pressure medication may need to be reduced in consultation with your doctors     Consultation note has been sent to the referring physician  Elayne Snare 07/02/2019, 2:33 PM   Note: This office note was prepared with Dragon voice recognition system technology. Any transcriptional errors that result from this process are unintentional.

## 2019-07-22 DIAGNOSIS — M7542 Impingement syndrome of left shoulder: Secondary | ICD-10-CM | POA: Insufficient documentation

## 2019-07-22 DIAGNOSIS — M5412 Radiculopathy, cervical region: Secondary | ICD-10-CM | POA: Insufficient documentation

## 2019-07-22 DIAGNOSIS — G5603 Carpal tunnel syndrome, bilateral upper limbs: Secondary | ICD-10-CM | POA: Insufficient documentation

## 2019-07-28 NOTE — Progress Notes (Signed)
Patient ID: April Park, female   DOB: [birth year], 86 y.o.   MRN: [id number]           Reason for Appointment: Consultation for Type 2 Diabetes  Referring physician: Dr. Neoma Laming   History of Present Illness:          Date of diagnosis of type 2 diabetes mellitus: 1998       Background history: She had been on metformin and subsequently Amaryl as initial treatments with fair control In 2011 because of relatively high readings Januvia was added Regimen has been continued unchanged subsequently Control has been variable with A1c as high as 8% in 2015  Recent history:   Records from treating physician, previous labs and medication list were reviewed in detail  Most recent A1c is 6.7 done on 06/19/2019    Non-insulin hypoglycemic drugs the patient is taking are: Amaryl 1 mg twice daily,  Metformin 500 mg twice daily Farxiga 5 mg daily  Current management, blood sugar patterns and problems identified:  She was started on Farxiga about 4 weeks ago to reduce blood sugar fluctuation and provide long-term cardiovascular benefits  Although her blood sugars appear to be overall better and consistently after starting [place] they appear to be higher in the last week or so and she is not clear why  She had stopped her Januvia apparently right after starting the [place] also  Amaryl was reduced to 1 mg twice daily  Lowest blood sugar was 99 after starting [place]  Her weight is down about a pound  She does not think she has changed her diet  As before is checking blood sugars mostly before her first meal late morning  However today about 3 to 4 hours after lunch her blood sugar is excellent at 128  She is using a ultra 2 meter but just ran out of test strips and not clear if she had expired test strips        Side effects from medications have been: None     Meal times are:  Breakfast is at 9-10 AM lunch: Only a snack, dinner: 6 PM  Typical meal intake: Breakfast is English  muffin with cheese, lunch is usually toast with tea, dinner is Roti, meat or lentils   Snacks are mostly fruit             Exercise:  Unable to do any  Glucose monitoring:  done about 2 times a day         Glucometer: One Touch ultra 2     Blood Glucose readings by time of day and averages from meter download:   PRE-MEAL Fasting Lunch Dinner Bedtime Overall  Glucose range:  99-236  234     Mean/median:      171   Previous readings:  PREMEAL Breakfast Lunch Dinner Bedtime  Overall   Glucose range:  88-213      Median:  131     142   POST-MEAL PC Breakfast  afternoon PC Dinner  Glucose range:   72-278   Median:       Dietician consultations: None  Weight history:  Wt Readings from Last 3 Encounters:  07/29/19 148 lb 9.6 oz (67.4 kg)  07/02/19 149 lb 3.2 oz (67.7 kg)  11/02/16 154 lb 3.2 oz (69.9 kg)    Glycemic control:   Lab Results  Component Value Date   HGBA1C 6.7 06/19/2019   HGBA1C 6.7 (H) 10/18/2016   HGBA1C 7.4 (H) 09/11/2013  Lab Results  Component Value Date   LDLCALC 104 06/19/2019   CREATININE 1.2 (A) 06/19/2019   No results found for: MICRALBCREAT  No results found for: FRUCTOSAMINE  No visits with results within 1 Week(s) from this visit.  Latest known visit with results is:  Abstract on 07/08/2019  Component Date Value Ref Range Status  . HM Diabetic Eye Exam 12/19/2017 No Retinopathy  No Retinopathy Final    Allergies as of 07/29/2019      Reactions   Ace Inhibitors Cough   Amiodarone Nausea Only, Nausea And Vomiting   Has tried med on 2 different occasions with same reaction       Medication List       Accurate as of July 29, 2019  3:52 PM. If you have any questions, ask your nurse or doctor.        STOP taking these medications   sitaGLIPtin 50 MG tablet Commonly known as: JANUVIA Stopped by: Elayne Snare, MD     TAKE these medications   carvedilol 25 MG tablet Commonly known as: COREG Take 25 mg by mouth 2 (two) times  daily with a meal.   dofetilide 125 MCG capsule Commonly known as: TIKOSYN Take 125 mcg by mouth 2 (two) times daily.   Entresto 24-26 MG Generic drug: sacubitril-valsartan Take 1 tablet by mouth 2 (two) times daily.   furosemide 20 MG tablet Commonly known as: LASIX Take 20 mg by mouth daily.   glimepiride 2 MG tablet Commonly known as: AMARYL Take 1 mg by mouth 2 (two) times daily.   glucose blood test strip 1 each by Other route 3 (three) times daily. Use OneTouch Ultra 2 test strips as instructed to check blood sugar 2-3 times daily.   Magnesium 500 MG Caps Take 1 capsule by mouth daily.   metFORMIN 500 MG tablet Commonly known as: GLUCOPHAGE Take 500 mg by mouth 2 (two) times daily with a meal.   nitroGLYCERIN 0.4 MG SL tablet Commonly known as: NITROSTAT Place 1 tablet under tongue as needed for chest pain (may repeat every 5 minutes but seek medical help if pain persists after 3 tablets) as needed   ONE TOUCH ULTRA 2 w/Device Kit 1 each by Does not apply route See admin instructions. Use OneTouch Ultra 2 to check blood sugar 2-3 times daily.   OneTouch Delica Lancets 00Q Misc 1 each by Does not apply route See admin instructions. Use OneTouch Delica lancets to check blood sugar 2-3 times daily.   Rivaroxaban 15 MG Tabs tablet Commonly known as: XARELTO Take 15 mg by mouth daily.       Allergies:  Allergies  Allergen Reactions  . Ace Inhibitors Cough  . Amiodarone Nausea Only and Nausea And Vomiting    Has tried med on 2 different occasions with same reaction     Past Medical History:  Diagnosis Date  . A-fib ([place])    failed amio due to Gi side effects (in [place])   . CAD (coronary artery disease)    non-obstructive CAD on cath January 2012. mLAD 40-50%  . Chest pain   . CHF (congestive heart failure) ([place])    due to recurrent NICM echo 06/2010: EF 20-25% mod-severe MR, moderate TR  . Diabetes mellitus   . GI bleed   . Hyperlipidemia   .  Hypertension   . S/P atrioventricular nodal ablation     Past Surgical History:  Procedure Laterality Date  . CARDIAC CATHETERIZATION    .  COLONOSCOPY WITH PROPOFOL N/A 11/07/2016   Procedure: COLONOSCOPY WITH PROPOFOL;  Surgeon: [name], MD;  Location: [facility] ENDOSCOPY;  Service: Endoscopy;  Laterality: N/A;  . ESOPHAGOGASTRODUODENOSCOPY (EGD) WITH PROPOFOL N/A 10/20/2016   Procedure: ESOPHAGOGASTRODUODENOSCOPY (EGD) WITH PROPOFOL;  Surgeon: [name], MD;  Location: [facility] ENDOSCOPY;  Service: Endoscopy;  Laterality: N/A;  . GIVENS CAPSULE STUDY N/A 11/07/2016   Procedure: GIVENS CAPSULE STUDY;  Surgeon: [name], MD;  Location: [facility] ENDOSCOPY;  Service: Endoscopy;  Laterality: N/A;  . KNEE ARTHROSCOPY     2009    Family History  Problem Relation Age of Onset  . Arrhythmia Mother 48  . Heart failure Mother        diastolic dysfunction  . Diabetes Mother   . Diabetes Sister        family history  . Hypertension Other        family history  . Diabetes Brother     Social History:  reports that she has never smoked. She has never used smokeless tobacco. She reports that she does not drink alcohol or use drugs.   Review of Systems  No history of thyroid disease Has had normal vitamin D and B12 levels previously   Lipid history: On Crestor, not sure if she is taking 10 or 20 mg, last LDL 104.    Lab Results  Component Value Date   Basile 104 06/19/2019           Hypertension: Has been treated with carvedilol, also on Entresto for cardiomyopathy She does not remember recent readings from home  BP Readings from Last 3 Encounters:  07/29/19 118/70  07/02/19 110/62  11/07/16 136/75   Renal function has been stable, Lasix reduced to 20 mg with starting Farxiga  Lab Results  Component Value Date   CREATININE 1.2 (A) 06/19/2019   CREATININE 0.94 10/20/2016   CREATININE 0.76 10/19/2016     Most recent eye exam was in [place] 2019  Most recent foot exam:  1/21  Currently known complications of diabetes: Neuropathy with symptoms of burning in the soles of the feet  LABS:  No visits with results within 1 Week(s) from this visit.  Latest known visit with results is:  Abstract on 07/08/2019  Component Date Value Ref Range Status  . HM Diabetic Eye Exam 12/19/2017 No Retinopathy  No Retinopathy Final    Physical Examination:  BP 118/70 (BP Location: Left Arm, Patient Position: Sitting, Cuff Size: Normal)   Pulse 64   Ht 5' 1" (1.549 m)   Wt 148 lb 9.6 oz (67.4 kg)   SpO2 97%   BMI 28.08 kg/m     ASSESSMENT:  Diabetes type 2  See history of present illness for detailed discussion of current diabetes management, blood sugar patterns and problems identified  Most recent A1c is 6.7  Current treatment regimen is Amaryl, [place] and Metformin  With switching to [place] and reducing Amaryl she has less tendency to mild hypoglycemia in the afternoon Not clear why her blood sugars are higher in the last week or so fasting even though she had stopped her Januvia 4 weeks ago and blood sugars were excellent in the 2 weeks after starting [place] She does not think she has changed her diet Not clear if she is having higher blood sugars after dinner since she forgets to monitor them Today about 4 hours after lunch blood sugar is near normal  History of mild hypertension: Blood pressure is well controlled, no effects of starting  Farxiga Lasix has been reduced  Renal function: This needs to be checked today especially with starting [place] and along with this her Lasix has been reduced  PLAN:    . Glucose monitoring: Patient advised to check readings either fasting or 2 hours after meals including some after lunch or dinner Given a written sheet of paper to keep a log of her blood sugars and given her guidelines on blood sugar targets New test strips will be sent for her ultra 2 monitor However consider changing her monitor as it may not  be accurate  . Therapy changes: She will increase her evening Amaryl to 2 mg Consider increasing evening Metformin also  Consider increasing Farxiga if blood sugars continue to stay high  We will see her in about a month before [place]   There are no Patient Instructions on file for this visit.     Elayne Snare 07/29/2019, 3:52 PM   Note: This office note was prepared with Dragon voice recognition system technology. Any transcriptional errors that result from this process are unintentional.

## 2019-07-29 ENCOUNTER — Other Ambulatory Visit: Payer: Self-pay

## 2019-07-29 ENCOUNTER — Encounter: Payer: Self-pay | Admitting: Endocrinology

## 2019-07-29 ENCOUNTER — Ambulatory Visit (INDEPENDENT_AMBULATORY_CARE_PROVIDER_SITE_OTHER): Payer: Medicare Other | Admitting: Endocrinology

## 2019-07-29 VITALS — BP 118/70 | HR 64 | Ht 61.0 in | Wt 148.6 lb

## 2019-07-29 DIAGNOSIS — I10 Essential (primary) hypertension: Secondary | ICD-10-CM

## 2019-07-29 DIAGNOSIS — E1165 Type 2 diabetes mellitus with hyperglycemia: Secondary | ICD-10-CM

## 2019-07-29 LAB — GLUCOSE, POCT (MANUAL RESULT ENTRY): POC Glucose: 128 mg/dl — AB (ref 70–99)

## 2019-07-29 MED ORDER — GLUCOSE BLOOD VI STRP: 1.0000 | ORAL_STRIP | Freq: Three times a day (TID) | 2 refills | Status: DC

## 2019-07-29 NOTE — Patient Instructions (Addendum)
Amaryl 2mg  in pms  Check blood sugars on waking up 4 days a week  Also check blood sugars about 2 hours after meals and do this after different meals by rotation  Recommended blood sugar levels on waking up are 90-130 and about 2 hours after meal is 130-180  Please bring your blood sugar monitor to each visit, thank you

## 2019-07-30 LAB — BASIC METABOLIC PANEL
BUN: 37 mg/dL — ABNORMAL HIGH (ref 6–23)
CO2: 25 mEq/L (ref 19–32)
Calcium: 9.5 mg/dL (ref 8.4–10.5)
Chloride: 103 mEq/L (ref 96–112)
Creatinine, Ser: 1.09 mg/dL (ref 0.40–1.20)
GFR: 47.66 mL/min — ABNORMAL LOW (ref >60.00)
Glucose, Bld: 134 mg/dL — ABNORMAL HIGH (ref 70–99)
Potassium: 4.3 mEq/L (ref 3.5–5.1)
Sodium: 134 mEq/L — ABNORMAL LOW (ref 135–145)

## 2019-07-30 LAB — FRUCTOSAMINE: Fructosamine: 323 umol/L — ABNORMAL HIGH (ref 0–285)

## 2019-07-30 NOTE — Progress Notes (Signed)
Please call to let patient know that the kidney test results are normal, no change in medications.  Please fax to PCP

## 2019-08-09 ENCOUNTER — Ambulatory Visit: Payer: Self-pay

## 2019-08-14 LAB — BASIC METABOLIC PANEL
BUN: 40 — AB (ref 4–21)
CO2: 18 (ref 13–22)
Chloride: 103 (ref 99–108)
Creatinine: 1.1 (ref <=1.1)
Glucose: 275
Potassium: 4.3 (ref 3.4–5.3)
Sodium: 139 (ref 137–147)

## 2019-08-14 LAB — COMPREHENSIVE METABOLIC PANEL
Albumin: 4.1 (ref 3.5–5.0)
Calcium: 9.2 (ref 8.7–10.7)
GFR calc non Af Amer: 46
Globulin: 3

## 2019-08-14 LAB — HEPATIC FUNCTION PANEL
ALT: 13 (ref 7–35)
AST: 15 (ref 13–35)
Alkaline Phosphatase: 46 (ref 25–125)
Bilirubin, Total: 0.2

## 2019-08-14 LAB — VITAMIN B12: Vitamin B-12: 875

## 2019-08-14 LAB — HEMOGLOBIN A1C: Hemoglobin A1C: 7.7

## 2019-08-15 ENCOUNTER — Other Ambulatory Visit: Payer: Self-pay

## 2019-08-15 ENCOUNTER — Telehealth: Payer: Self-pay

## 2019-08-15 MED ORDER — SITAGLIPTIN PHOSPHATE 100 MG PO TABS: 100.0000 mg | ORAL_TABLET | Freq: Every day | ORAL | 2 refills | Status: DC

## 2019-08-15 NOTE — Telephone Encounter (Signed)
Called pt's husband and notified him of this. Pt's husband wrote this information down and repeated it all back correctly. New Rx was sent for Januvia 100mg .

## 2019-08-15 NOTE — Telephone Encounter (Signed)
Pt's husband called and stated that the pt's blood sugars have been 300-400 since last visit when medications were changed. Pt's husband does not know how to operate meter, and due to various barriers, I was unable to walk pt through getting blood sugar readings over the phone.  Please advise.

## 2019-08-15 NOTE — Telephone Encounter (Signed)
She should increase her Metformin to 2 tablets in the morning and 2 tablets at dinnertime.  To increase the Amaryl to full 2 mg morning and evening Also she can start back on Januvia, if she needs a new prescription she will need 100 mg daily.

## 2019-08-19 ENCOUNTER — Ambulatory Visit (INDEPENDENT_AMBULATORY_CARE_PROVIDER_SITE_OTHER): Payer: Medicare Other | Admitting: Endocrinology

## 2019-08-19 ENCOUNTER — Other Ambulatory Visit: Payer: Self-pay

## 2019-08-19 ENCOUNTER — Encounter: Payer: Self-pay | Admitting: Endocrinology

## 2019-08-19 VITALS — BP 100/60 | HR 63 | Ht 59.75 in | Wt 144.2 lb

## 2019-08-19 DIAGNOSIS — I10 Essential (primary) hypertension: Secondary | ICD-10-CM

## 2019-08-19 DIAGNOSIS — E1165 Type 2 diabetes mellitus with hyperglycemia: Secondary | ICD-10-CM

## 2019-08-19 DIAGNOSIS — R42 Dizziness and giddiness: Secondary | ICD-10-CM

## 2019-08-19 MED ORDER — GLUCOSE BLOOD VI STRP: ORAL_STRIP | 2 refills | Status: DC

## 2019-08-19 MED ORDER — METFORMIN HCL 1000 MG PO TABS: 1000.0000 mg | ORAL_TABLET | Freq: Two times a day (BID) | ORAL | 3 refills | Status: DC

## 2019-08-19 NOTE — Progress Notes (Signed)
Patient ID: April Park, female   DOB: [birth year], 86 y.o.   MRN: [id number]           Reason for Appointment: Consultation for Type 2 Diabetes  Referring physician: Dr. Neoma Laming   History of Present Illness:          Date of diagnosis of type 2 diabetes mellitus: 1998       Background history: She had been on metformin and subsequently Amaryl as initial treatments with fair control In 2011 because of relatively high readings Januvia was added Regimen has been continued unchanged subsequently Control has been variable with A1c as high as 8% in 2015  Recent history:    Most recent A1c is 7.7, previously was 6.7    Non-insulin hypoglycemic drugs: Januvia 100 mg, Amaryl 2 mg twice daily,  Metformin 1000 mg twice daily Farxiga 5 mg daily  Current management, blood sugar patterns and problems identified:  She was told to increase her evening Amaryl on her last visit because of relatively high fasting readings  However she had called about blood sugars significantly going home after intra-articular steroid injections on 08/08/2019  After her second steroid injection her blood sugar was 410 at night  Previously her fasting blood sugars improved and were mostly around 140  More recently because of her high sugars she was told to increase her Metformin to 1 g twice daily and also Amaryl to 2 mg twice daily  Januvia was restarted at 100 mg daily  She was also given new test strips for her ultra 2 monitor on the last visit, this has been an old meter for  Labs done by PCP showed creatinine to be 1.1  Blood sugars appear to be stabilizing the last 2 days in the 130s fasting and up to 160-180 midday  Does not usually check readings after dinner which is her main meal  Also blood sugars lower before dinnertime since lunch is mostly a snack        Side effects from medications have been: None     Meal times are:  Breakfast is at 9-10 AM, lunch: Only a snack, dinner: 6  PM  Typical meal intake: Breakfast is English muffin with cheese, lunch is usually toast with tea, dinner is Roti, meat or lentils   Snacks are mostly fruit             Exercise:  Unable to do much walking  Glucose monitoring:  done about 2 times a day         Glucometer: One Touch ultra 2     Blood Glucose readings by time of day and averages from meter download:   PRE-MEAL Fasting Lunch Dinner Bedtime Overall  Glucose range:  133-324  151-333  93-257  151-410   Mean/median:  171  235  199   210   Previous readings:  PRE-MEAL Fasting Lunch Dinner Bedtime Overall  Glucose range:  99-236  234     Mean/median:      171     Dietician consultations: None  Weight history:  Wt Readings from Last 3 Encounters:  08/19/19 144 lb 3.2 oz (65.4 kg)  07/29/19 148 lb 9.6 oz (67.4 kg)  07/02/19 149 lb 3.2 oz (67.7 kg)    Glycemic control: 7.7   Lab Results  Component Value Date   HGBA1C 7.7 08/14/2019   HGBA1C 6.7 06/19/2019   HGBA1C 6.7 (H) 10/18/2016   Lab Results  Component Value Date   LDLCALC  104 06/19/2019   CREATININE 1.1 08/14/2019   No results found for: [facility]  Lab Results  Component Value Date   FRUCTOSAMINE 323 (H) 07/29/2019    Abstract on 08/19/2019  Component Date Value Ref Range Status  . Glucose 08/14/2019 275   Final  . BUN 08/14/2019 40* 4 - 21 Final  . CO2 08/14/2019 18  13 - 22 Final  . Creatinine 08/14/2019 1.1  0.5 - 1.1 Final  . Potassium 08/14/2019 4.3  3.4 - 5.3 Final  . Sodium 08/14/2019 139  137 - 147 Final  . Chloride 08/14/2019 103  99 - 108 Final  . Globulin 08/14/2019 3.0   Final  . GFR calc non Af Amer 08/14/2019 46   Final  . Calcium 08/14/2019 9.2  8.7 - 10.7 Final  . Albumin 08/14/2019 4.1  3.5 - 5.0 Final  . Alkaline Phosphatase 08/14/2019 46  25 - 125 Final  . ALT 08/14/2019 13  7 - 35 Final  . AST 08/14/2019 15  13 - 35 Final  . Bilirubin, Total 08/14/2019 0.2   Final  . Vitamin B-12 08/14/2019 875   Final  .  Hemoglobin A1C 08/14/2019 7.7   Final    Allergies as of 08/19/2019      Reactions   Ace Inhibitors Cough   Amiodarone Nausea Only, Nausea And Vomiting   Has tried med on 2 different occasions with same reaction       Medication List       Accurate as of August 19, 2019  4:50 PM. If you have any questions, ask your nurse or doctor.        STOP taking these medications   nitroGLYCERIN 0.4 MG SL tablet Commonly known as: NITROSTAT Stopped by: Elayne Snare, MD   ONE TOUCH ULTRA 2 w/Device Kit Stopped by: Jayme Cloud, LPN   Rivaroxaban 15 MG Tabs tablet Commonly known as: XARELTO Stopped by: Elayne Snare, MD     TAKE these medications   calcium-vitamin D 250-100 MG-UNIT tablet Take 1 tablet by mouth daily.   carvedilol 25 MG tablet Commonly known as: COREG Take 25 mg by mouth 2 (two) times daily with a meal.   dofetilide 125 MCG capsule Commonly known as: TIKOSYN Take 125 mcg by mouth 2 (two) times daily.   Entresto 49-51 MG Generic drug: sacubitril-valsartan Take 1 tablet by mouth 2 (two) times daily. What changed: Another medication with the same name was removed. Continue taking this medication, and follow the directions you see here. Changed by: Elayne Snare, MD   Farxiga 5 MG Tabs tablet Generic drug: dapagliflozin propanediol Take 5 mg by mouth daily.   furosemide 20 MG tablet Commonly known as: LASIX Take 20 mg by mouth daily.   glimepiride 2 MG tablet Commonly known as: AMARYL Take 2 mg by mouth 2 (two) times daily. Take 54m by mouth twice daily.   glucose blood test strip Use OneTouch Verio test strips as instructed to check blood sugar twice daily. What changed:   how much to take  how to take this  when to take this  Another medication with the same name was removed. Continue taking this medication, and follow the directions you see here. Changed by: NJayme Cloud LPN   Magnesium 5654MG Caps Take 1 capsule by mouth daily.   magnesium gluconate  500 MG tablet Commonly known as: MAGONATE Take 500 mg by mouth daily.   metFORMIN 1000 MG tablet Commonly known as: GLUCOPHAGE Take  1 tablet (1,000 mg total) by mouth 2 (two) times daily with a meal. What changed:   medication strength  additional instructions Changed by: Elayne Snare, MD   OneTouch Delica Lancets 11N Misc 1 each by Does not apply route See admin instructions. Use OneTouch Delica lancets to check blood sugar 2-3 times daily.   rosuvastatin 20 MG tablet Commonly known as: CRESTOR Take 20 mg by mouth daily.   sitaGLIPtin 100 MG tablet Commonly known as: Januvia Take 1 tablet (100 mg total) by mouth daily.   Vitamin D3 25 MCG (1000 UT) Caps Take 2,000 Units by mouth daily.       Allergies:  Allergies  Allergen Reactions  . Ace Inhibitors Cough  . Amiodarone Nausea Only and Nausea And Vomiting    Has tried med on 2 different occasions with same reaction     Past Medical History:  Diagnosis Date  . A-fib ([employer])    failed amio due to Gi side effects (in [place])   . CAD (coronary artery disease)    non-obstructive CAD on cath January 2012. mLAD 40-50%  . Chest pain   . CHF (congestive heart failure) ([place])    due to recurrent NICM echo 06/2010: EF 20-25% mod-severe MR, moderate TR  . Diabetes mellitus   . GI bleed   . Hyperlipidemia   . Hypertension   . S/P atrioventricular nodal ablation     Past Surgical History:  Procedure Laterality Date  . CARDIAC CATHETERIZATION    . COLONOSCOPY WITH PROPOFOL N/A 11/07/2016   Procedure: COLONOSCOPY WITH PROPOFOL;  Surgeon: [name], MD;  Location: [facility] ENDOSCOPY;  Service: Endoscopy;  Laterality: N/A;  . ESOPHAGOGASTRODUODENOSCOPY (EGD) WITH PROPOFOL N/A 10/20/2016   Procedure: ESOPHAGOGASTRODUODENOSCOPY (EGD) WITH PROPOFOL;  Surgeon: [name], MD;  Location: [facility] ENDOSCOPY;  Service: Endoscopy;  Laterality: N/A;  . GIVENS CAPSULE STUDY N/A 11/07/2016   Procedure: GIVENS CAPSULE STUDY;  Surgeon: [name], MD;  Location: [facility] ENDOSCOPY;  Service: Endoscopy;  Laterality: N/A;  . KNEE ARTHROSCOPY     2009    Family History  Problem Relation Age of Onset  . Arrhythmia Mother 92  . Heart failure Mother        diastolic dysfunction  . Diabetes Mother   . Diabetes Sister        family history  . Hypertension Other        family history  . Diabetes Brother     Social History:  reports that she has never smoked. She has never used smokeless tobacco. She reports that she does not drink alcohol or use drugs.   Review of Systems   She sometimes feels dizzy headed with head spinning   Has had normal vitamin D and B12 levels previously  Lipid history: On Crestor, presumably 20 mg as on the list, last LDL 104.    Lab Results  Component Value Date   [place] 104 06/19/2019           Hypertension: Has been treated with carvedilol, also on Entresto for cardiomyopathy  Blood pressure being followed by her cardiologist  BP Readings from Last 3 Encounters:  08/19/19 100/60  07/29/19 118/70  07/02/19 110/62   Renal function has been stable, Lasix reduced to 20 mg with starting Farxiga, now 3/7  Lab Results  Component Value Date   CREATININE 1.1 08/14/2019   CREATININE 1.09 07/29/2019   CREATININE 1.2 (A) 06/19/2019     Most recent eye exam was in 2019  Most recent foot exam: 1/21  Currently known complications of diabetes: Neuropathy with symptoms of burning in the soles of the feet  She has had steroid injections in her knee and shoulder, shoulder pain is better with this  LABS:  Abstract on 08/19/2019  Component Date Value Ref Range Status  . Glucose 08/14/2019 275   Final  . BUN 08/14/2019 40* 4 - 21 Final  . CO2 08/14/2019 18  13 - 22 Final  . Creatinine 08/14/2019 1.1  0.5 - 1.1 Final  . Potassium 08/14/2019 4.3  3.4 - 5.3 Final  . Sodium 08/14/2019 139  137 - 147 Final  . Chloride 08/14/2019 103  99 - 108 Final  . Globulin 08/14/2019 3.0   Final  . GFR  calc non Af Amer 08/14/2019 46   Final  . Calcium 08/14/2019 9.2  8.7 - 10.7 Final  . Albumin 08/14/2019 4.1  3.5 - 5.0 Final  . Alkaline Phosphatase 08/14/2019 46  25 - 125 Final  . ALT 08/14/2019 13  7 - 35 Final  . AST 08/14/2019 15  13 - 35 Final  . Bilirubin, Total 08/14/2019 0.2   Final  . Vitamin B-12 08/14/2019 875   Final  . Hemoglobin A1C 08/14/2019 7.7   Final    Physical Examination:  BP 100/60 (BP Location: Left Arm, Patient Position: Sitting, Cuff Size: Normal)   Pulse 63   Ht 4' 11.75" (1.518 m)   Wt 144 lb 3.2 oz (65.4 kg)   SpO2 98%   BMI 28.40 kg/m     ASSESSMENT:  Diabetes type 2  See history of present illness for detailed discussion of current diabetes management, blood sugar patterns and problems identified  Her A1c is relatively higher at 7.7  Current treatment regimen is Amaryl, Farxiga, Januvia and Metformin  Blood sugars have been more difficult to control and she is requiring multiple medication However blood sugars were improving with increasing her Amaryl prior to her getting steroids recently  Subsequently blood sugars were as high as 410 with symptoms of hyperglycemia This is now subsiding with increasing her Metformin to maximum doses, restarting Januvia 100 mg and using Amaryl 2 mg twice daily now Lowest blood sugar recently 119 and as before lower in the afternoon; will likely continue to improve as the effects of steroids are wearing off  Has lost weight likely because of hyperglycemia since last visit Her ultra 2 glucose monitor is relatively old and not clear if this is accurate, the time on this was reprogrammed today  History of mild hypertension: Blood pressure is low normal and she will need to discuss with cardiologist, not sure if she is having symptoms from this On carvedilol mostly for cardiac benefits  Lasix has been reduced recently by her PCP also  Renal function: This has been recently normal with creatinine  1.1  Constipation: Likely to be from medications, discussed that Metformin mostly causes diarrhea not constipation  PLAN:    . Glucose monitoring: She was given a One Touch Verio monitor to make sure she is getting accurate readings today Recommended checking blood sugars after dinner also periodically To call if consistently high or low  . Treatment changes: She will reduce her morning Amaryl to half a tablet to potentially reduce afternoon hypoglycemia Since her creatinine is normal and she is tolerating the higher dose of Metformin she will continue 1000 mg twice daily a new prescription was sent for this  For now we will continue her on  Januvia also at 100 mg dosage May consider combining Metformin and Farxiga  Consider increasing [place] if blood sugars are increasing again   She will discuss reduction of carvedilol with her cardiologist especially if she is getting orthostatic lightheadedness  Follow-up in about 5 to 6 weeks  Patient Instructions  Amaryl 6m, 1/2 tab in am and 1 in pm  Bp is low, ask about Carvedilol      AElayne Snare3/06/2019, 4:50 PM   Note: This office note was prepared with Dragon voice recognition system technology. Any transcriptional errors that result from this process are unintentional.

## 2019-08-19 NOTE — Patient Instructions (Addendum)
Amaryl 2mg , 1/2 tab in am and 1 in pm  Bp is low, ask about Carvedilol

## 2019-08-22 ENCOUNTER — Encounter: Payer: Self-pay | Admitting: Cardiovascular Disease

## 2019-09-02 ENCOUNTER — Other Ambulatory Visit: Payer: Self-pay

## 2019-09-02 ENCOUNTER — Ambulatory Visit (INDEPENDENT_AMBULATORY_CARE_PROVIDER_SITE_OTHER): Payer: Medicare Other | Admitting: Gastroenterology

## 2019-09-02 VITALS — BP 121/77 | HR 73 | Temp 98.0°F | Ht 59.75 in | Wt 149.6 lb

## 2019-09-02 DIAGNOSIS — D508 Other iron deficiency anemias: Secondary | ICD-10-CM

## 2019-09-02 DIAGNOSIS — K3184 Gastroparesis: Secondary | ICD-10-CM

## 2019-09-02 NOTE — Progress Notes (Signed)
[name] MD, MRCP(U.K) [address]  [place]  [place], [place] [address]  Main: [phone]  Fax: [phone]   Primary Care Physician: Jodi Marble, MD  Primary Gastroenterologist:  Dr. [name]   Follow-up for anemia  HPI: April Park is a 86 y.o. female   Summary of history :  I had seen her initially in May 2018 as an inpatient consult when she was admitted with symptomatic anemia while on Xarelto.  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  Subsequently performed a colonoscopy which was normal except for internal hemorrhoids.  I follow that up with a capsule study of the small bowel but surprisingly revealed roundworms.  Treated her with albendazole with significant improvement in her hemoglobin.  She lives in [place] and [place] to the states on and off.  Interval history   2018-08/23/2019  Labs 08/30/2019 hemoglobin 10 g with an MCV of 92 and a platelet count of 96.  08/14/2019: B12 normal, hepatic function panel normal, BMP normal.  No recent iron studies.  The patient is accompanied by her husband as well as her son Dr. Humphrey Rolls.  She states that she has been having nausea usually in the evenings and she had her glucose meter along with her.  I noted that her blood sugars have generally tended to be slightly higher in the evenings in the 200s range.  She has had prior higher sugars but has been better controlled recently due to a change in her modifications.  She has had a few episodes of throwing up clear material in the evenings.  She also has hemoglobin of 10 with no overt blood loss.  Current Outpatient Medications  Medication Sig Dispense Refill  . calcium-vitamin D 250-100 MG-UNIT tablet Take 1 tablet by mouth daily.    . carvedilol (COREG) 25 MG tablet Take 25 mg by mouth 2 (two) times daily with a meal.    . Cholecalciferol (VITAMIN D3) 25 MCG (1000 UT) CAPS Take 2,000 Units by mouth daily.    . dapagliflozin propanediol  (FARXIGA) 5 MG TABS tablet Take 5 mg by mouth daily.    Marland Kitchen dofetilide (TIKOSYN) 125 MCG capsule Take 125 mcg by mouth 2 (two) times daily.    . furosemide (LASIX) 20 MG tablet Take 20 mg by mouth daily.     Marland Kitchen glimepiride (AMARYL) 2 MG tablet Take 2 mg by mouth 2 (two) times daily. Take 2mg  by mouth twice daily.    Marland Kitchen glucose blood test strip Use OneTouch Verio test strips as instructed to check blood sugar twice daily. 200 each 2  . Magnesium 500 MG CAPS Take 1 capsule by mouth daily.    . magnesium gluconate (MAGONATE) 500 MG tablet Take 500 mg by mouth daily.    . metFORMIN (GLUCOPHAGE) 1000 MG tablet Take 1 tablet (1,000 mg total) by mouth 2 (two) times daily with a meal. 180 tablet 3  . OneTouch Delica Lancets 53G MISC 1 each by Does not apply route See admin instructions. Use OneTouch Delica lancets to check blood sugar 2-3 times daily.    . rosuvastatin (CRESTOR) 20 MG tablet Take 20 mg by mouth daily.    . sacubitril-valsartan (ENTRESTO) 49-51 MG Take 1 tablet by mouth 2 (two) times daily.    . sitaGLIPtin (JANUVIA) 100 MG tablet Take 1 tablet (100 mg total) by mouth daily. 30 tablet 2   No current facility-administered medications for this visit.    Allergies as  of 09/02/2019 - Review Complete 08/19/2019  Allergen Reaction Noted  . Ace inhibitors Cough 06/28/2011  . Amiodarone Nausea Only and Nausea And Vomiting 07/22/2016    ROS:  General: Negative for anorexia, weight loss, fever, chills, fatigue, weakness. ENT: Negative for hoarseness, difficulty swallowing , nasal congestion. CV: Negative for chest pain, angina, palpitations, dyspnea on exertion, peripheral edema.  Respiratory: Negative for dyspnea at rest, dyspnea on exertion, cough, sputum, wheezing.  GI: See history of present illness. GU:  Negative for dysuria, hematuria, urinary incontinence, urinary frequency, nocturnal urination.  Endo: Negative for unusual weight change.    Physical Examination:   There were no  vitals taken for this visit.  General: Well-nourished, well-developed in no acute distress.  Eyes: No icterus. Conjunctivae pink. Psych: Alert and cooperative, normal mood and affect.   Imaging Studies: No results found.  Assessment and Plan:   April Park is a 86 y.o. y/o female here to follow-up for anemia.  Duodenal AVM ablated in 2018 and was found to have roundworms on capsule study of small bowel.  Treated with albendazole and she returned to [place] at that time and did not have a follow-up hence.  Hemoglobin has improved marginally from 9.3 to 10 g but was previously normal and hence there is actually a drop.  She has a low platelet count presently which is normal previously.  She also has nausea usually in the evenings associated with elevated blood sugars more likely suggestive of gastroparesis.  Plan 1.  Due to persistence of anemia would suggest to check iron studies and if low would benefit from IV iron.  If iron studies are normal then would benefit from a hematology opinion and they have requested to be referred to Dr. RAO 2.  Check urine analysis for blood loss. 3.  Would need to consider stool tests for ova and parasites to see if there is persistence of a Ascaris  or reinfection x3 4.  Tight glycemic control particularly with modification of regimen to cover p.m. rises in the blood sugar.  She will discuss with her physician.  In addition suggest at the gastroparesis diet.    Dr [name]  MD,MRCP [facility]) Follow up in 2 weeks

## 2019-09-02 NOTE — Patient Instructions (Signed)
Gastroparesis  Gastroparesis is a condition in which food takes longer than normal to empty from the stomach. The condition is usually long-lasting (chronic). It may also be called delayed gastric emptying. There is no cure, but there are treatments and things that you can do at home to help relieve symptoms. Treating the underlying condition that causes gastroparesis can also help relieve symptoms. What are the causes? In many cases, the cause of this condition is not known. Possible causes include:  A hormone (endocrine) disorder, such as hypothyroidism or diabetes.  A nervous system disease, such as Parkinson's disease or multiple sclerosis.  Cancer, infection, or surgery that affects the stomach or vagus nerve. The vagus nerve runs from your chest, through your neck, to the lower part of your brain.  A connective tissue disorder, such as scleroderma.  Certain medicines. What increases the risk? You are more likely to develop this condition if you:  Have certain disorders or diseases, including: ? An endocrine disorder. ? An eating disorder. ? Amyloidosis. ? Scleroderma. ? Parkinson's disease. ? Multiple sclerosis. ? Cancer or infection of the stomach or the vagus nerve.  Have had surgery on the stomach or vagus nerve.  Take certain medicines.  Are female. What are the signs or symptoms? Symptoms of this condition include:  Feeling full after eating very little.  Nausea.  Vomiting.  Heartburn.  Abdominal bloating.  Inconsistent blood sugar (glucose) levels on blood tests.  Lack of appetite.  Weight loss.  Acid from the stomach coming up into the esophagus (gastroesophageal reflux).  Sudden tightening (spasm) of the stomach, which can be painful. Symptoms may come and go. Some people may not notice any symptoms. How is this diagnosed? This condition is diagnosed with tests, such as:  Tests that check how long it takes food to move through the stomach and  intestines. These tests include: ? Upper gastrointestinal (GI) series. For this test, you drink a liquid that shows up well on X-rays, and then X-rays will be taken of your intestines. ? Gastric emptying scintigraphy. For this test, you eat food that contains a small amount of radioactive material, and then scans are taken. ? Wireless capsule GI monitoring system. For this test, you swallow a pill (capsule) that records information about how foods and fluid move through your stomach.  Gastric manometry. For this test, a tube is passed down your throat and into your stomach to measure electrical and muscular activity.  Endoscopy. For this test, a long, thin tube is passed down your throat and into your stomach to check for problems in your stomach lining.  Ultrasound. This test uses sound waves to create images of inside the body. This can help rule out gallbladder disease or pancreatitis as a cause of your symptoms. How is this treated? There is no cure for gastroparesis. Treatment may include:  Treating the underlying cause.  Managing your symptoms by making changes to your diet and exercise habits.  Taking medicines to control nausea and vomiting and to stimulate stomach muscles.  Getting food through a feeding tube in the hospital. This may be done in severe cases.  Having surgery to insert a device into your body that helps improve stomach emptying and control nausea and vomiting (gastric neurostimulator). Follow these instructions at home:  Take over-the-counter and prescription medicines only as told by your health care provider.  Follow instructions from your health care provider about eating or drinking restrictions. Your health care provider may recommend that you: ? Eat   smaller meals more often. ? Eat low-fat foods. ? Eat low-fiber forms of high-fiber foods. For example, eat cooked vegetables instead of raw vegetables. ? Have only liquid foods instead of solid foods. Liquid  foods are easier to digest.  Drink enough fluid to keep your urine pale yellow.  Exercise as often as told by your health care provider.  Keep all follow-up visits as told by your health care provider. This is important. Contact a health care provider if you:  Notice that your symptoms do not improve with treatment.  Have new symptoms. Get help right away if you:  Have severe abdominal pain that does not improve with treatment.  Have nausea that is severe or does not go away.  Cannot drink fluids without vomiting. Summary  Gastroparesis is a chronic condition in which food takes longer than normal to empty from the stomach.  Symptoms include nausea, vomiting, heartburn, abdominal bloating, and loss of appetite.  Eating smaller portions, and low-fat, low-fiber foods may help you manage your symptoms.  Get help right away if you have severe abdominal pain. This information is not intended to replace advice given to you by your health care provider. Make sure you discuss any questions you have with your health care provider. Document Revised: 09/04/2017 Document Reviewed: 04/11/2017 Elsevier Patient Education  2020 [employer] Inc.  

## 2019-09-03 ENCOUNTER — Encounter: Payer: Self-pay | Admitting: Gastroenterology

## 2019-09-03 ENCOUNTER — Encounter: Payer: Self-pay | Admitting: Cardiovascular Disease

## 2019-09-03 LAB — URINALYSIS
Bilirubin, UA: NEGATIVE
Ketones, UA: NEGATIVE
Leukocytes,UA: NEGATIVE
Nitrite, UA: NEGATIVE
Protein,UA: NEGATIVE
Specific Gravity, UA: 1.009 (ref 1.005–1.030)
Urobilinogen, Ur: 0.2 mg/dL (ref 0.2–1.0)
pH, UA: 7.5 (ref 5.0–7.5)

## 2019-09-03 LAB — IRON,TIBC AND FERRITIN PANEL
Ferritin: 100 ng/mL (ref 15–150)
Iron Saturation: 22 % (ref 15–55)
Iron: 57 ug/dL (ref 27–139)
Total Iron Binding Capacity: 257 ug/dL (ref 250–450)
UIBC: 200 ug/dL (ref 118–369)

## 2019-09-03 NOTE — Progress Notes (Signed)
Inform son Dr Humphrey Rolls (cardiologist ) his mothers iron studies are normal and does not explain anemia and low platelet count that are new. Suggest referral to Hematology to be seen soon as she may be returning back to [place] . They wanted to see Dr Janese Banks by choice .   Dr [name] MD,MRCP [facility]) Gastroenterology/Hepatology Pager: [phone]

## 2019-09-06 LAB — OVA AND PARASITE EXAMINATION

## 2019-09-06 NOTE — Progress Notes (Signed)
Inform there is an organism in the stool identified which is not the prior one seen and this does not need to be treated. Lets continiue to check stool 2 more times to ensure no round worms are seen.

## 2019-09-10 ENCOUNTER — Other Ambulatory Visit: Payer: Self-pay

## 2019-09-10 ENCOUNTER — Telehealth: Payer: Self-pay

## 2019-09-10 DIAGNOSIS — D508 Other iron deficiency anemias: Secondary | ICD-10-CM

## 2019-09-10 DIAGNOSIS — K3184 Gastroparesis: Secondary | ICD-10-CM

## 2019-09-10 NOTE — Telephone Encounter (Signed)
Called pt/pt spouse to inform them of pt's results and Dr. Georgeann Oppenheim directions to proceed with the repeat stool collection.  Unable to contact, LVM to return call

## 2019-09-10 NOTE — Telephone Encounter (Signed)
Pt spouse returned my call. I have informed him of Dr. Georgeann Oppenheim instructions. He agrees and plans to drop off pt's second specimen tomorrow afternoon.

## 2019-09-10 NOTE — Telephone Encounter (Signed)
-----   Message from [name], MD sent at 09/06/2019  9:44 AM EDT ----- Inform there is an organism in the stool identified which is not the prior one seen and this does not need to be treated. Lets continiue to check stool 2 more times to ensure no round worms are seen.

## 2019-09-13 ENCOUNTER — Inpatient Hospital Stay: Payer: Medicare Other

## 2019-09-13 ENCOUNTER — Encounter: Payer: Self-pay | Admitting: Oncology

## 2019-09-13 ENCOUNTER — Other Ambulatory Visit: Payer: Self-pay

## 2019-09-13 ENCOUNTER — Inpatient Hospital Stay: Payer: Medicare Other | Attending: Oncology | Admitting: Oncology

## 2019-09-13 VITALS — BP 125/51 | HR 62 | Temp 98.7°F | Ht 59.75 in | Wt 148.0 lb

## 2019-09-13 DIAGNOSIS — D649 Anemia, unspecified: Secondary | ICD-10-CM

## 2019-09-13 DIAGNOSIS — D638 Anemia in other chronic diseases classified elsewhere: Secondary | ICD-10-CM

## 2019-09-13 DIAGNOSIS — D696 Thrombocytopenia, unspecified: Secondary | ICD-10-CM | POA: Diagnosis not present

## 2019-09-13 DIAGNOSIS — I4891 Unspecified atrial fibrillation: Secondary | ICD-10-CM | POA: Diagnosis not present

## 2019-09-13 DIAGNOSIS — Z79899 Other long term (current) drug therapy: Secondary | ICD-10-CM | POA: Insufficient documentation

## 2019-09-13 DIAGNOSIS — N183 Chronic kidney disease, stage 3 unspecified: Secondary | ICD-10-CM | POA: Insufficient documentation

## 2019-09-13 DIAGNOSIS — E1122 Type 2 diabetes mellitus with diabetic chronic kidney disease: Secondary | ICD-10-CM | POA: Diagnosis not present

## 2019-09-13 DIAGNOSIS — I130 Hypertensive heart and chronic kidney disease with heart failure and stage 1 through stage 4 chronic kidney disease, or unspecified chronic kidney disease: Secondary | ICD-10-CM | POA: Insufficient documentation

## 2019-09-13 DIAGNOSIS — Z7984 Long term (current) use of oral hypoglycemic drugs: Secondary | ICD-10-CM | POA: Diagnosis not present

## 2019-09-13 DIAGNOSIS — D509 Iron deficiency anemia, unspecified: Secondary | ICD-10-CM | POA: Diagnosis not present

## 2019-09-13 LAB — IRON AND TIBC
Iron: 66 ug/dL (ref 28–170)
Saturation Ratios: 20 % (ref 10.4–31.8)
TIBC: 339 ug/dL (ref 250–450)
UIBC: 273 ug/dL

## 2019-09-13 LAB — COMPREHENSIVE METABOLIC PANEL
ALT: 13 U/L (ref 0–44)
AST: 18 U/L (ref 15–41)
Albumin: 4 g/dL (ref 3.5–5.0)
Alkaline Phosphatase: 37 U/L — ABNORMAL LOW (ref 38–126)
Anion gap: 8 (ref 5–15)
BUN: 21 mg/dL (ref 8–23)
CO2: 29 mmol/L (ref 22–32)
Calcium: 9.1 mg/dL (ref 8.9–10.3)
Chloride: 102 mmol/L (ref 98–111)
Creatinine, Ser: 0.9 mg/dL (ref 0.44–1.00)
GFR calc Af Amer: >60 mL/min (ref >60)
GFR calc non Af Amer: 58 mL/min — ABNORMAL LOW (ref >60)
Glucose, Bld: 106 mg/dL — ABNORMAL HIGH (ref 70–99)
Potassium: 3.6 mmol/L (ref 3.5–5.1)
Sodium: 139 mmol/L (ref 135–145)
Total Bilirubin: 0.5 mg/dL (ref 0.3–1.2)
Total Protein: 7.2 g/dL (ref 6.5–8.1)

## 2019-09-13 LAB — CBC WITH DIFFERENTIAL/PLATELET
Abs Immature Granulocytes: 0.05 10*3/uL (ref 0.00–0.07)
Basophils Absolute: 0 10*3/uL (ref 0.0–0.1)
Basophils Relative: 1 %
Eosinophils Absolute: 0.1 10*3/uL (ref 0.0–0.5)
Eosinophils Relative: 2 %
HCT: 31.7 % — ABNORMAL LOW (ref 36.0–46.0)
Hemoglobin: 10 g/dL — ABNORMAL LOW (ref 12.0–15.0)
Immature Granulocytes: 1 %
Lymphocytes Relative: 33 %
Lymphs Abs: 1.8 10*3/uL (ref 0.7–4.0)
MCH: 29.6 pg (ref 26.0–34.0)
MCHC: 31.5 g/dL (ref 30.0–36.0)
MCV: 93.8 fL (ref 80.0–100.0)
Monocytes Absolute: 0.5 10*3/uL (ref 0.1–1.0)
Monocytes Relative: 10 %
Neutro Abs: 2.9 10*3/uL (ref 1.7–7.7)
Neutrophils Relative %: 53 %
Platelets: 205 10*3/uL (ref 150–400)
RBC: 3.38 MIL/uL — ABNORMAL LOW (ref 3.87–5.11)
RDW: 16 % — ABNORMAL HIGH (ref 11.5–15.5)
WBC: 5.5 10*3/uL (ref 4.0–10.5)
nRBC: 0 % (ref 0.0–0.2)

## 2019-09-13 LAB — FERRITIN: Ferritin: 48 ng/mL (ref 11–307)

## 2019-09-13 LAB — RETICULOCYTES
Immature Retic Fract: 12.9 % (ref 2.3–15.9)
RBC.: 3.39 MIL/uL — ABNORMAL LOW (ref 3.87–5.11)
Retic Count, Absolute: 60.3 10*3/uL (ref 19.0–186.0)
Retic Ct Pct: 1.8 % (ref 0.4–3.1)

## 2019-09-13 LAB — TECHNOLOGIST SMEAR REVIEW: Plt Morphology: NORMAL

## 2019-09-13 LAB — FOLATE: Folate: 15.5 ng/mL (ref >5.9)

## 2019-09-13 LAB — HEPATITIS C ANTIBODY: HCV Ab: NONREACTIVE

## 2019-09-13 LAB — OVA AND PARASITE EXAMINATION

## 2019-09-13 LAB — HIV ANTIBODY (ROUTINE TESTING W REFLEX): HIV Screen 4th Generation wRfx: NONREACTIVE

## 2019-09-13 LAB — TSH: TSH: 1.057 u[IU]/mL (ref 0.350–4.500)

## 2019-09-13 LAB — VITAMIN B12: Vitamin B-12: 453 pg/mL (ref 180–914)

## 2019-09-14 LAB — ANA COMPREHENSIVE PANEL
Anti JO-1: <0.2 AI (ref 0.0–0.9)
Centromere Ab Screen: <0.2 AI (ref 0.0–0.9)
Chromatin Ab SerPl-aCnc: <0.2 AI (ref 0.0–0.9)
ENA SM Ab Ser-aCnc: <0.2 AI (ref 0.0–0.9)
Ribonucleic Protein: 0.6 AI (ref 0.0–0.9)
SSA (Ro) (ENA) Antibody, IgG: <0.2 AI (ref 0.0–0.9)
SSB (La) (ENA) Antibody, IgG: <0.2 AI (ref 0.0–0.9)
Scleroderma (Scl-70) (ENA) Antibody, IgG: <0.2 AI (ref 0.0–0.9)
ds DNA Ab: 5 IU/mL (ref 0–9)

## 2019-09-14 LAB — HAPTOGLOBIN: Haptoglobin: 188 mg/dL (ref 41–333)

## 2019-09-15 ENCOUNTER — Encounter: Payer: Self-pay | Admitting: Gastroenterology

## 2019-09-15 NOTE — Progress Notes (Signed)
Patient ID: April Park, female   DOB: December 12, 1931, 86 y.o.   MRN: [id number]           Reason for Appointment: Follow-up for Type 2 Diabetes  Referring physician: Dr. Neoma Laming   History of Present Illness:          Date of diagnosis of type 2 diabetes mellitus: 1998       Background history: She had been on metformin and subsequently Amaryl as initial treatments with fair control In 2011 because of relatively high readings Januvia was added Regimen has been continued unchanged subsequently Control has been variable with A1c as high as 8% in 2015  Recent history:    Most recent A1c is 7.7, previously was 6.7    Non-insulin hypoglycemic drugs: Januvia 100 mg, Amaryl 2 mg twice daily,  Metformin 1000 mg twice daily Farxiga 5 mg daily  Current management, blood sugar patterns and problems identified:  She was told to REDUCE her morning Amaryl on her last visit to prevent potential low sugars in the afternoon since her blood sugars were improving and lowest blood sugar was 93 at that time  However she misunderstood and reduced her evening Amaryl to half tablet  Although blood sugars look excellent until about last Wednesday they are now mostly relatively high  However has not done any blood sugars after dinner  Sporadically has readings over 200 midmorning even though she thinks she is eating protein like an egg in the morning at breakfast  Avoiding a lot of fruits and juices  She has started using a new Verio monitor which was downloaded today  Blood sugar readings as below with some variability, lab glucose was 109 but she had delayed her lunch that day  No hypoglycemia  Her weight is about the same        Side effects from medications have been: None     Meal times are:  Breakfast is at 9-10 AM, lunch: Only a snack, dinner: 6 PM  Typical meal intake: Breakfast is bread with egg, lunch is usually toast with tea, dinner is Roti, meat or lentils   Snacks are mostly  fruit             Exercise:  Unable to do any significant walking  Glucose monitoring:  done about 2 times a day         Glucometer: One Touch ultra 2     Blood Glucose readings by time of day and averages from meter download:   PRE-MEAL Fasting Lunch Dinner Bedtime Overall  Glucose range:  111-150  117, 180    111-238  Mean/median:  138     148   POST-MEAL PC Breakfast PC Lunch PC Dinner  Glucose range:  138-238  133, 120   Mean/median:      PREVIOUS readings:  PRE-MEAL Fasting Lunch Dinner Bedtime Overall  Glucose range:  133-324  151-333  93-257  151-410   Mean/median:  171  235  199   210    Dietician consultations: None  Weight history:  Wt Readings from Last 3 Encounters:  09/16/19 151 lb 9.6 oz (68.8 kg)  09/13/19 148 lb (67.1 kg)  09/02/19 149 lb 9.6 oz (67.9 kg)    Glycemic control: 7.7   Lab Results  Component Value Date   HGBA1C 7.7 08/14/2019   HGBA1C 6.7 06/19/2019   HGBA1C 6.7 (H) 10/18/2016   Lab Results  Component Value Date   LDLCALC 104 06/19/2019   CREATININE  0.90 09/13/2019   No results found for: [facility]  Lab Results  Component Value Date   FRUCTOSAMINE 323 (H) 07/29/2019    Appointment on 09/13/2019  Component Date Value Ref Range Status  . Kappa free light chain 09/13/2019 43.8* 3.3 - 19.4 mg/L Final  . Lamda free light chains 09/13/2019 32.7* 5.7 - 26.3 mg/L Final  . Kappa, lamda light chain ratio 09/13/2019 1.34  0.26 - 1.65 Final   Comment: (NOTE) Performed At: [facility] [place], [place] [id number] Rush Farmer MD MV:[id number]   . HCV Ab 09/13/2019 NON REACTIVE  NON REACTIVE Final   Comment: (NOTE) Nonreactive HCV antibody screen is consistent with no HCV infections,  unless recent infection is suspected or other evidence exists to indicate HCV infection. Performed at [facility], [place] [address] [place], [place] [address]   . HIV Screen 4th Generation wRfx 09/13/2019 NON  REACTIVE  NON REACTIVE Final   Performed at [facility], [place] [address], [place],  [address]  . ds DNA Ab 09/13/2019 5  0 - 9 IU/mL Final   Comment: (NOTE)                                   Negative      <5                                   Equivocal  5 - 9                                   Positive      >9   . Ribonucleic Protein 09/13/2019 0.6  0.0 - 0.9 AI Final  . ENA SM Ab Ser-aCnc 09/13/2019 <0.2  0.0 - 0.9 AI Final  . Scleroderma (Scl-70) (ENA) Antibod* 09/13/2019 <0.2  0.0 - 0.9 AI Final  . SSA (Ro) (ENA) Antibody, IgG 09/13/2019 <0.2  0.0 - 0.9 AI Final  . SSB (La) (ENA) Antibody, IgG 09/13/2019 <0.2  0.0 - 0.9 AI Final  . Chromatin Ab SerPl-aCnc 09/13/2019 <0.2  0.0 - 0.9 AI Final  . Anti JO-1 09/13/2019 <0.2  0.0 - 0.9 AI Final  . Centromere Ab Screen 09/13/2019 <0.2  0.0 - 0.9 AI Final  . See below: 09/13/2019 Comment   Final   Comment: (NOTE) Autoantibody                       Disease Association ------------------------------------------------------------                        Condition                  Frequency ---------------------   ------------------------   --------- Antinuclear Antibody,    SLE, mixed connective Direct (ANA-D)           tissue diseases ---------------------   ------------------------   --------- dsDNA                    SLE                        40 - 60% ---------------------   ------------------------   --------- Chromatin  Drug induced SLE                90%                         SLE                        48 - 97% ---------------------   ------------------------   --------- SSA (Ro)                 SLE                        25 - 35%                         Sjogren's Syndrome         40 - 70%                         Neonatal Lupus                 100% ---------------------   ------------------------   --------- SSB (La)                 SLE                                                       10%                          Sjogren's Syndrome              30% ---------------------   -----------------------    --------- Sm (anti-Smith)          SLE                        15 - 30% ---------------------   -----------------------    --------- RNP                      Mixed Connective Tissue                         Disease                         95% (U1 nRNP,                SLE                        30 - 50% anti-ribonucleoprotein)  Polymyositis and/or                         Dermatomyositis                 20% ---------------------   ------------------------   --------- Scl-70 (antiDNA          Scleroderma (diffuse)      20 - 35% topoisomerase)           Crest  13% ---------------------   ------------------------   --------- Jo-1                     Polymyositis and/or                         Dermatomyositis            20 - 40% ---------------------   ------------------------   --------- Centromere B             Scleroderma -                           Crest                         variant                         80% Performed At: [facility] [place], [place] [id number] Rush Farmer MD IO:[id number]   . TSH 09/13/2019 1.057  0.350 - 4.500 uIU/mL Final   Comment: Performed by a 3rd Generation assay with a functional sensitivity of <=0.01 uIU/mL. Performed at [facility], [address] [place], [place] [address]   . Haptoglobin 09/13/2019 188  41 - 333 mg/dL Final   Comment: (NOTE) Performed At: [facility] [place], [place] [id number] Rush Farmer MD QI:[id number]   . Retic Ct Pct 09/13/2019 1.8  0.4 - 3.1 % Final  . RBC. 09/13/2019 3.39* 3.87 - 5.11 MIL/uL Final  . Retic Count, Absolute 09/13/2019 60.3  19.0 - 186.0 K/uL Final  . Immature Retic Fract 09/13/2019 12.9  2.3 - 15.9 % Final   Performed at [facility], [address], [place], [place] [address]  . Iron 09/13/2019 66  28 - 170 ug/dL  Final  . TIBC 09/13/2019 339  250 - 450 ug/dL Final  . Saturation Ratios 09/13/2019 20  10.4 - 31.8 % Final  . UIBC 09/13/2019 273  ug/dL Final   Performed at [facility], [address], [place], [place] [address]  . Ferritin 09/13/2019 48  11 - 307 ng/mL Final   Performed at [facility], [address] [place], [place] [address]  . Vitamin B-12 09/13/2019 453  180 - 914 pg/mL Final   Comment: (NOTE) This assay is not validated for testing neonatal or myeloproliferative syndrome specimens for Vitamin B12 levels. Performed at [facility], [place] [address] [place], [place] [address]   . Folate 09/13/2019 15.5  >5.9 ng/mL Final   Performed at [facility], [address] [place], [place] [address]  . Sodium 09/13/2019 139  135 - 145 mmol/L Final  . Potassium 09/13/2019 3.6  3.5 - 5.1 mmol/L Final  . Chloride 09/13/2019 102  98 - 111 mmol/L Final  . CO2 09/13/2019 29  22 - 32 mmol/L Final  . Glucose, Bld 09/13/2019 106* 70 - 99 mg/dL Final   Glucose reference range applies only to samples taken after fasting for at least 8 hours.  . BUN 09/13/2019 21  8 - 23 mg/dL Final  . Creatinine, Ser 09/13/2019 0.90  0.44 - 1.00 mg/dL Final  . Calcium 09/13/2019 9.1  8.9 - 10.3 mg/dL Final  . Total Protein 09/13/2019 7.2  6.5 - 8.1 g/dL Final  . Albumin 09/13/2019 4.0  3.5 - 5.0 g/dL Final  .  AST 09/13/2019 18  15 - 41 U/L Final  . ALT 09/13/2019 13  0 - 44 U/L Final  . Alkaline Phosphatase 09/13/2019 37* 38 - 126 U/L Final  . Total Bilirubin 09/13/2019 0.5  0.3 - 1.2 mg/dL Final  . GFR calc non Af Amer 09/13/2019 58* >60 mL/min Final  . GFR calc Af Amer 09/13/2019 >60  >60 mL/min Final  . Anion gap 09/13/2019 8  5 - 15 Final   Performed at [facility], [address] [place], [place] [address]  . WBC 09/13/2019 5.5  4.0 - 10.5 K/uL Final  . RBC 09/13/2019 3.38* 3.87 - 5.11 MIL/uL Final  . Hemoglobin 09/13/2019 10.0* 12.0 - 15.0 g/dL Final  .  HCT 09/13/2019 31.7* 36.0 - 46.0 % Final  . MCV 09/13/2019 93.8  80.0 - 100.0 fL Final  . MCH 09/13/2019 29.6  26.0 - 34.0 pg Final  . MCHC 09/13/2019 31.5  30.0 - 36.0 g/dL Final  . RDW 09/13/2019 16.0* 11.5 - 15.5 % Final  . Platelets 09/13/2019 205  150 - 400 K/uL Final  . nRBC 09/13/2019 0.0  0.0 - 0.2 % Final  . Neutrophils Relative % 09/13/2019 53  % Final  . Neutro Abs 09/13/2019 2.9  1.7 - 7.7 K/uL Final  . Lymphocytes Relative 09/13/2019 33  % Final  . Lymphs Abs 09/13/2019 1.8  0.7 - 4.0 K/uL Final  . Monocytes Relative 09/13/2019 10  % Final  . Monocytes Absolute 09/13/2019 0.5  0.1 - 1.0 K/uL Final  . Eosinophils Relative 09/13/2019 2  % Final  . Eosinophils Absolute 09/13/2019 0.1  0.0 - 0.5 K/uL Final  . Basophils Relative 09/13/2019 1  % Final  . Basophils Absolute 09/13/2019 0.0  0.0 - 0.1 K/uL Final  . Immature Granulocytes 09/13/2019 1  % Final  . Abs Immature Granulocytes 09/13/2019 0.05  0.00 - 0.07 K/uL Final   Performed at [facility], [address], [place], [place] [address]  . WBC Morphology 09/13/2019 MORPHOLOGY UNREMARKABLE   Final  . RBC Morphology 09/13/2019 OVALOCYTES   Final  . Tech Review 09/13/2019 Normal platelet morphology   Final   Comment: PLATELETS APPEAR ADEQUATE Performed at [facility], [place] [place], [place] [address]   Orders Only on 09/10/2019  Component Date Value Ref Range Status  . OVA + PARASITE EXAM 09/11/2019 Final report   Final   Comment: These results were obtained using wet preparation(s) and trichrome stained smear. This test does not include testing for Cryptosporidium parvum, Cyclospora, or Microsporidia.   . O&P result 1 09/11/2019 Comment   Final   Comment: No ova, cysts, or parasites seen. One negative specimen does not rule out the possibility of a parasitic infection.     Allergies as of 09/16/2019      Reactions   Ace Inhibitors Cough   Amiodarone Nausea Only, Nausea And Vomiting   Has  tried med on 2 different occasions with same reaction       Medication List       Accurate as of September 16, 2019  9:07 PM. If you have any questions, ask your nurse or doctor.        calcium-vitamin D 250-100 MG-UNIT tablet Take 1 tablet by mouth daily.   carvedilol 25 MG tablet Commonly known as: COREG Take 25 mg by mouth 2 (two) times daily with a meal.   dofetilide 125 MCG capsule Commonly known as: TIKOSYN Take 125 mcg by  mouth 2 (two) times daily.   Entresto 49-51 MG Generic drug: sacubitril-valsartan Take 1 tablet by mouth 2 (two) times daily.   Farxiga 5 MG Tabs tablet Generic drug: dapagliflozin propanediol Take 5 mg by mouth daily.   furosemide 20 MG tablet Commonly known as: LASIX Take 20 mg by mouth See admin instructions. 3 nights weekly   glimepiride 2 MG tablet Commonly known as: AMARYL Take 2 mg by mouth See admin instructions. Take 2mg  in the morning and 1mg  in the evening   glucose blood test strip Use OneTouch Verio test strips as instructed to check blood sugar twice daily.   Magnesium 500 MG Caps Take 1 capsule by mouth daily.   metFORMIN 1000 MG tablet Commonly known as: GLUCOPHAGE Take 1 tablet (1,000 mg total) by mouth 2 (two) times daily with a meal. What changed:   when to take this  additional instructions   OneTouch Delica Lancets 83M Misc 1 each by Does not apply route See admin instructions. Use OneTouch Delica lancets to check blood sugar 2-3 times daily.   rosuvastatin 20 MG tablet Commonly known as: CRESTOR Take 20 mg by mouth daily.   sitaGLIPtin 100 MG tablet Commonly known as: Januvia Take 1 tablet (100 mg total) by mouth daily. What changed: how much to take   Vitamin D3 25 MCG (1000 UT) Caps Take 2,000 Units by mouth daily.       Allergies:  Allergies  Allergen Reactions  . Ace Inhibitors Cough  . Amiodarone Nausea Only and Nausea And Vomiting    Has tried med on 2 different occasions with same reaction      Past Medical History:  Diagnosis Date  . A-fib ([place])    failed amio due to Gi side effects (in [place])   . CAD (coronary artery disease)    non-obstructive CAD on cath January 2012. mLAD 40-50%  . Chest pain   . CHF (congestive heart failure) ([place])    due to recurrent NICM echo 06/2010: EF 20-25% mod-severe MR, moderate TR  . Diabetes mellitus   . GI bleed   . Hyperlipidemia   . Hypertension   . S/P atrioventricular nodal ablation     Past Surgical History:  Procedure Laterality Date  . CARDIAC CATHETERIZATION    . COLONOSCOPY WITH PROPOFOL N/A 11/07/2016   Procedure: COLONOSCOPY WITH PROPOFOL;  Surgeon: [name], MD;  Location[facility] ENDOSCOPY;  Service: Endoscopy;  Laterality: N/A;  . ESOPHAGOGASTRODUODENOSCOPY (EGD) WITH PROPOFOL N/A 10/20/2016   Procedure: ESOPHAGOGASTRODUODENOSCOPY (EGD) WITH PROPOFOL;  Surgeon: [name], MD;  Location: [facility] ENDOSCOPY;  Service: Endoscopy;  Laterality: N/A;  . GIVENS CAPSULE STUDY N/A 11/07/2016   Procedure: GIVENS CAPSULE STUDY;  Surgeon: [name], MD;  Location: [facility] ENDOSCOPY;  Service: Endoscopy;  Laterality: N/A;  . KNEE ARTHROSCOPY     2009    Family History  Problem Relation Age of [place]  . Arrhythmia Mother 26  . Heart failure Mother        diastolic dysfunction  . Diabetes Mother   . Diabetes Sister        family history  . Hypertension Other        family history  . Diabetes Brother     Social History:  reports that she has never smoked. She has never used smokeless tobacco. She reports that she does not drink alcohol or use drugs.   Review of Systems     Has had normal vitamin D and B12 levels previously  Lipid history: On Crestor, presumably 20 mg as on the list, last LDL 104.    Lab Results  Component Value Date   [place] 104 06/19/2019           Hypertension: Has been treated with carvedilol, also on Entresto for cardiomyopathy  Blood pressure being followed by her cardiologist  BP Readings  from Last 3 Encounters:  09/16/19 110/78  09/13/19 (!) 125/51  09/02/19 121/77   Renal function has been stable, Lasix reduced to 20 mg with starting Farxiga  Lab Results  Component Value Date   CREATININE 0.90 09/13/2019   CREATININE 1.1 08/14/2019   CREATININE 1.09 07/29/2019    Most recent eye exam was in 2019  Most recent foot exam: 1/21  Currently known complications of diabetes: Neuropathy with symptoms of burning in the soles of the feet which have resolved   She has recent visit with orthopedic surgeon for carpal tunnel syndrome   LABS:  Appointment on 09/13/2019  Component Date Value Ref Range Status  . Kappa free light chain 09/13/2019 43.8* 3.3 - 19.4 mg/L Final  . Lamda free light chains 09/13/2019 32.7* 5.7 - 26.3 mg/L Final  . Kappa, lamda light chain ratio 09/13/2019 1.34  0.26 - 1.65 Final   Comment: (NOTE) Performed At: [facility] [place], [place] [id number] Rush Farmer MD [id number]   . HCV Ab 09/13/2019 NON REACTIVE  NON REACTIVE Final   Comment: (NOTE) Nonreactive HCV antibody screen is consistent with no HCV infections,  unless recent infection is suspected or other evidence exists to indicate HCV infection. Performed at [facility], [place] [address] [place], [place] [address]   . HIV Screen 4th Generation wRfx 09/13/2019 NON REACTIVE  NON REACTIVE Final   Performed at [facility], [place] [address] [place], [place] [address]  . ds DNA Ab 09/13/2019 5  0 - 9 IU/mL Final   Comment: (NOTE)                                   Negative      <5                                   Equivocal  5 - 9                                   Positive      >9   . Ribonucleic Protein 09/13/2019 0.6  0.0 - 0.9 AI Final  . ENA SM Ab Ser-aCnc 09/13/2019 <0.2  0.0 - 0.9 AI Final  . Scleroderma (Scl-70) (ENA) Antibod* 09/13/2019 <0.2  0.0 - 0.9 AI Final  . SSA (Ro) (ENA) Antibody, IgG 09/13/2019 <0.2  0.0 - 0.9 AI Final  . SSB  (La) (ENA) Antibody, IgG 09/13/2019 <0.2  0.0 - 0.9 AI Final  . Chromatin Ab SerPl-aCnc 09/13/2019 <0.2  0.0 - 0.9 AI Final  . Anti JO-1 09/13/2019 <0.2  0.0 - 0.9 AI Final  . Centromere Ab Screen 09/13/2019 <0.2  0.0 - 0.9 AI Final  . See below: 09/13/2019 Comment   Final   Comment: (NOTE) Autoantibody  Disease Association ------------------------------------------------------------                        Condition                  Frequency ---------------------   ------------------------   --------- Antinuclear Antibody,    SLE, mixed connective Direct (ANA-D)           tissue diseases ---------------------   ------------------------   --------- dsDNA                    SLE                        40 - 60% ---------------------   ------------------------   --------- Chromatin                Drug induced SLE                90%                         SLE                        48 - 97% ---------------------   ------------------------   --------- SSA (Ro)                 SLE                        25 - 35%                         Sjogren's Syndrome         40 - 70%                         Neonatal Lupus                 100% ---------------------   ------------------------   --------- SSB (La)                 SLE                                                       10%                         Sjogren's Syndrome              30% ---------------------   -----------------------    --------- Sm (anti-Smith)          SLE                        15 - 30% ---------------------   -----------------------    --------- RNP                      Mixed Connective Tissue                         Disease  95% (U1 nRNP,                SLE                        30 - 50% anti-ribonucleoprotein)  Polymyositis and/or                         Dermatomyositis                 20% ---------------------   ------------------------   --------- Scl-70 (antiDNA           Scleroderma (diffuse)      20 - 35% topoisomerase)           Crest                           13% ---------------------   ------------------------   --------- Jo-1                     Polymyositis and/or                         Dermatomyositis            20 - 40% ---------------------   ------------------------   --------- Centromere B             Scleroderma -                           Crest                         variant                         80% Performed At: [facility] [place], [place] [id number] Rush Farmer MD [id number]:[id number]   . TSH 09/13/2019 1.057  0.350 - 4.500 uIU/mL Final   Comment: Performed by a 3rd Generation assay with a functional sensitivity of <=0.01 uIU/mL. Performed at [facility], [address], [place], [place] [address]   . Haptoglobin 09/13/2019 188  41 - 333 mg/dL Final   Comment: (NOTE) Performed At: [facility] [place], [place] [id number] Rush Farmer MD QQ:[id number]   . Retic Ct Pct 09/13/2019 1.8  0.4 - 3.1 % Final  . RBC. 09/13/2019 3.39* 3.87 - 5.11 MIL/uL Final  . Retic Count, Absolute 09/13/2019 60.3  19.0 - 186.0 K/uL Final  . Immature Retic Fract 09/13/2019 12.9  2.3 - 15.9 % Final   Performed at [facility], [address], [place], [place] [address]  . Iron 09/13/2019 66  28 - 170 ug/dL Final  . TIBC 09/13/2019 339  250 - 450 ug/dL Final  . Saturation Ratios 09/13/2019 20  10.4 - 31.8 % Final  . UIBC 09/13/2019 273  ug/dL Final   Performed at [facility], [address], [place], [place] [address]  . Ferritin 09/13/2019 48  11 - 307 ng/mL Final   Performed at [facility], [address] [place], [place] [address]  . Vitamin B-12 09/13/2019 453  180 - 914 pg/mL Final   Comment: (NOTE) This assay is not validated for testing neonatal or myeloproliferative syndrome specimens for Vitamin B12 levels. Performed at [facility], [place]  [address] [place], [place] [address]   . Folate 09/13/2019 15.5  >5.9 ng/mL Final   Performed at [facility], [address] [place], [place] [address]  . Sodium 09/13/2019 139  135 - 145 mmol/L Final  . Potassium 09/13/2019 3.6  3.5 - 5.1 mmol/L Final  . Chloride 09/13/2019 102  98 - 111 mmol/L Final  . CO2 09/13/2019 29  22 - 32 mmol/L Final  . Glucose, Bld 09/13/2019 106* 70 - 99 mg/dL Final   Glucose reference range applies only to samples taken after fasting for at least 8 hours.  . BUN 09/13/2019 21  8 - 23 mg/dL Final  . Creatinine, Ser 09/13/2019 0.90  0.44 - 1.00 mg/dL Final  . Calcium 09/13/2019 9.1  8.9 - 10.3 mg/dL Final  . Total Protein 09/13/2019 7.2  6.5 - 8.1 g/dL Final  . Albumin 09/13/2019 4.0  3.5 - 5.0 g/dL Final  . AST 09/13/2019 18  15 - 41 U/L Final  . ALT 09/13/2019 13  0 - 44 U/L Final  . Alkaline Phosphatase 09/13/2019 37* 38 - 126 U/L Final  . Total Bilirubin 09/13/2019 0.5  0.3 - 1.2 mg/dL Final  . GFR calc non Af Amer 09/13/2019 58* >60 mL/min Final  . GFR calc Af Amer 09/13/2019 >60  >60 mL/min Final  . Anion gap 09/13/2019 8  5 - 15 Final   Performed at [facility], [address], [place], [place] [address]  . WBC 09/13/2019 5.5  4.0 - 10.5 K/uL Final  . RBC 09/13/2019 3.38* 3.87 - 5.11 MIL/uL Final  . Hemoglobin 09/13/2019 10.0* 12.0 - 15.0 g/dL Final  . HCT 09/13/2019 31.7* 36.0 - 46.0 % Final  . MCV 09/13/2019 93.8  80.0 - 100.0 fL Final  . MCH 09/13/2019 29.6  26.0 - 34.0 pg Final  . MCHC 09/13/2019 31.5  30.0 - 36.0 g/dL Final  . RDW 09/13/2019 16.0* 11.5 - 15.5 % Final  . Platelets 09/13/2019 205  150 - 400 K/uL Final  . nRBC 09/13/2019 0.0  0.0 - 0.2 % Final  . Neutrophils Relative % 09/13/2019 53  % Final  . Neutro Abs 09/13/2019 2.9  1.7 - 7.7 K/uL Final  . Lymphocytes Relative 09/13/2019 33  % Final  . Lymphs Abs 09/13/2019 1.8  0.7 - 4.0 K/uL Final  . Monocytes Relative 09/13/2019 10  % Final  . Monocytes Absolute  09/13/2019 0.5  0.1 - 1.0 K/uL Final  . Eosinophils Relative 09/13/2019 2  % Final  . Eosinophils Absolute 09/13/2019 0.1  0.0 - 0.5 K/uL Final  . Basophils Relative 09/13/2019 1  % Final  . Basophils Absolute 09/13/2019 0.0  0.0 - 0.1 K/uL Final  . Immature Granulocytes 09/13/2019 1  % Final  . Abs Immature Granulocytes 09/13/2019 0.05  0.00 - 0.07 K/uL Final   Performed at [facility], [address] [place], [place] [address]  . WBC Morphology 09/13/2019 MORPHOLOGY UNREMARKABLE   Final  . RBC Morphology 09/13/2019 OVALOCYTES   Final  . Tech Review 09/13/2019 Normal platelet morphology   Final   Comment: PLATELETS APPEAR ADEQUATE Performed at [facility], [address] [place], [place] [address]   Orders Only on 09/10/2019  Component Date Value Ref Range Status  . OVA + PARASITE EXAM 09/11/2019 Final report   Final   Comment: These results were obtained using wet preparation(s) and trichrome stained smear. This test does not include testing for Cryptosporidium parvum, Cyclospora, or Microsporidia.   Marland Kitchen  O&P result 1 09/11/2019 Comment   Final   Comment: No ova, cysts, or parasites seen. One negative specimen does not rule out the possibility of a parasitic infection.     Physical Examination:  BP 110/78 (BP Location: Left Arm, Patient Position: Sitting, Cuff Size: Normal)   Pulse 86   Ht 4' 11.75" (1.518 m)   Wt 151 lb 9.6 oz (68.8 kg)   SpO2 99%   BMI 29.86 kg/m     ASSESSMENT:  Diabetes type 2  See history of present illness for detailed discussion of current diabetes management, blood sugar patterns and problems identified  Her A1c is most recently higher at 7.7  Current treatment regimen is Amaryl, Farxiga, Januvia and Metformin  Blood sugars have been closer to target now with not requiring any further steroid injections Also continuing to take Amaryl twice a day With her reducing her evening Amaryl dose to half tablet her sugars in the last  5 or 6 days appear to be higher including in the morning However not clear why her blood sugars fluctuate She has not done any readings after dinner Again no side effects with [place] and renal function normal So far tolerating the maximum dose of Metformin also  Her recent average blood sugar is about 148 and overall control is adequate now for her age and comorbid conditions  She thinks she is avoiding high carbohydrate meals and getting some protein at most meals No hypoglycemia  Neuropathy: Symptoms appear to be better with more consistent control   PLAN:    . Glucose monitoring: She was advised to check blood sugars after meals consistently in the evening Discussed blood sugar targets at various times   She will go back to Amaryl 2 mg twice daily  However may reduce this if blood sugars start getting relatively low at any given time  Continue Farxiga 5 mg daily, no side effects, decrease in blood pressure or change in renal function  Encourage her to start walking as much as tolerated and she thinks she can do this better when she goes back to her home country  Follow-up when she returns  Will send 90-day prescriptions    Patient Instructions  Amaryl 2mg  2x daily  Check blood sugars on waking up days a week  Also check blood sugars about 2 hours after meals and do this after different meals by rotation  Recommended blood sugar levels on waking up are 90-130 and about 2 hours after meal is 130-180  Please bring your blood sugar monitor to each visit, thank you        Elayne Snare 09/16/2019, 9:06 PM   Note: This office note was prepared with Dragon voice recognition system technology. Any transcriptional errors that result from this process are unintentional.

## 2019-09-16 ENCOUNTER — Encounter: Payer: Self-pay | Admitting: Endocrinology

## 2019-09-16 ENCOUNTER — Other Ambulatory Visit: Payer: Self-pay

## 2019-09-16 ENCOUNTER — Ambulatory Visit (INDEPENDENT_AMBULATORY_CARE_PROVIDER_SITE_OTHER): Payer: Medicare Other | Admitting: Endocrinology

## 2019-09-16 VITALS — BP 110/78 | HR 86 | Ht 59.75 in | Wt 151.6 lb

## 2019-09-16 DIAGNOSIS — E1165 Type 2 diabetes mellitus with hyperglycemia: Secondary | ICD-10-CM

## 2019-09-16 DIAGNOSIS — E1142 Type 2 diabetes mellitus with diabetic polyneuropathy: Secondary | ICD-10-CM | POA: Diagnosis not present

## 2019-09-16 LAB — KAPPA/LAMBDA LIGHT CHAINS
Kappa free light chain: 43.8 mg/L — ABNORMAL HIGH (ref 3.3–19.4)
Kappa, lambda light chain ratio: 1.34 (ref 0.26–1.65)
Lambda free light chains: 32.7 mg/L — ABNORMAL HIGH (ref 5.7–26.3)

## 2019-09-16 MED ORDER — GLIMEPIRIDE 2 MG PO TABS: 2.0000 mg | ORAL_TABLET | ORAL | 1 refills | Status: AC

## 2019-09-16 MED ORDER — FARXIGA 5 MG PO TABS: 5.0000 mg | ORAL_TABLET | Freq: Every day | ORAL | 1 refills | Status: AC

## 2019-09-16 MED ORDER — METFORMIN HCL 1000 MG PO TABS: 1000.0000 mg | ORAL_TABLET | Freq: Two times a day (BID) | ORAL | 3 refills | Status: DC

## 2019-09-16 MED ORDER — SITAGLIPTIN PHOSPHATE 100 MG PO TABS: 100.0000 mg | ORAL_TABLET | Freq: Every day | ORAL | 2 refills | Status: AC

## 2019-09-16 NOTE — Patient Instructions (Signed)
Amaryl 2mg  2x daily  Check blood sugars on waking up days a week  Also check blood sugars about 2 hours after meals and do this after different meals by rotation  Recommended blood sugar levels on waking up are 90-130 and about 2 hours after meal is 130-180  Please bring your blood sugar monitor to each visit, thank you

## 2019-09-17 ENCOUNTER — Ambulatory Visit (INDEPENDENT_AMBULATORY_CARE_PROVIDER_SITE_OTHER): Payer: Medicare Other | Admitting: Gastroenterology

## 2019-09-17 ENCOUNTER — Encounter: Payer: Self-pay | Admitting: Oncology

## 2019-09-17 VITALS — BP 123/72 | HR 73 | Temp 98.1°F | Ht 59.75 in | Wt 153.4 lb

## 2019-09-17 DIAGNOSIS — K3184 Gastroparesis: Secondary | ICD-10-CM

## 2019-09-17 DIAGNOSIS — D638 Anemia in other chronic diseases classified elsewhere: Secondary | ICD-10-CM | POA: Diagnosis not present

## 2019-09-17 LAB — MULTIPLE MYELOMA PANEL, SERUM
Albumin SerPl Elph-Mcnc: 3.5 g/dL (ref 2.9–4.4)
Albumin/Glob SerPl: 1.2 (ref 0.7–1.7)
Alpha 1: 0.2 g/dL (ref 0.0–0.4)
Alpha2 Glob SerPl Elph-Mcnc: 0.9 g/dL (ref 0.4–1.0)
B-Globulin SerPl Elph-Mcnc: 1 g/dL (ref 0.7–1.3)
Gamma Glob SerPl Elph-Mcnc: 1 g/dL (ref 0.4–1.8)
Globulin, Total: 3.1 g/dL (ref 2.2–3.9)
IgA: 221 mg/dL (ref 64–422)
IgG (Immunoglobin G), Serum: 1021 mg/dL (ref 586–1602)
IgM (Immunoglobulin M), Srm: 53 mg/dL (ref 26–217)
Total Protein ELP: 6.6 g/dL (ref 6.0–8.5)

## 2019-09-17 NOTE — Progress Notes (Signed)
Hematology/Oncology Consult note [facility] Telephone:([phone] [phone]  Patient Care Team: Jodi Marble, MD as PCP - General (Internal Medicine)   Name of the patient: April Park  [id number]  1932/02/08    Reason for referral- anemia   Referring physician- Dr. Vicente Males  Date of visit: 09/17/19   History of presenting illness- Patient is a 86 year old female with a past medical history is significant for hypertension hyperlipidemia, chronic kidney disease, coronary artery disease, type 2 diabetes among other medical problems.  She was seen by Dr. Vicente Males for iron deficiency anemia.EGD showed duodenal AVM which was treated with APC.  Colonoscopy was unremarkable except for internal hemorrhoids.  However capsule study showed roundworms and she was treated with albendazole with significant improvement in her hemoglobin.  Patient is here with her husband and her daughter.  Daughter is an Materials engineer at [employer].  Patient is from [place] mainly speaks will do and her daughter mainly with translation for her  Her most recent CBC from 08/29/2019 for a white count of 6.2, H&H of 10/32.3 and a platelet count of 96.  CMP was unremarkable.  In February 2021 her H&H was 10.9/34.2 with a platelet count of 100.  In December 2020 patient was noted to have a hemoglobin of 12 and a platelet count of 120.  Given the fall and her hemoglobin between December to February she has been referred to hematology  Patient has ongoing fatigue and joint pain mainly in her left shoulder and her bilateral knees.  She also has difficulty controlling her blood sugars at times and she had an episode of nausea and vomiting about 2 weeks ago which was attribute it to diabetic gastroparesis.  She is otherwise doing well for her age and remains independent of her ADLs and IADLs.  ECOG PS- 1  Pain scale- 0   Review of systems- Review of Systems  Constitutional: Positive for malaise/fatigue.  Negative for chills, fever and weight loss.  HENT: Negative for congestion, ear discharge and nosebleeds.   Eyes: Negative for blurred vision.  Respiratory: Negative for cough, hemoptysis, sputum production, shortness of breath and wheezing.   Cardiovascular: Negative for chest pain, palpitations, orthopnea and claudication.  Gastrointestinal: Negative for abdominal pain, blood in stool, constipation, diarrhea, heartburn, melena, nausea and vomiting.  Genitourinary: Negative for dysuria, flank pain, frequency, hematuria and urgency.  Musculoskeletal: Negative for back pain, joint pain and myalgias.  Skin: Negative for rash.  Neurological: Negative for dizziness, tingling, focal weakness, seizures, weakness and headaches.  Endo/Heme/Allergies: Does not bruise/bleed easily.  Psychiatric/Behavioral: Negative for depression and suicidal ideas. The patient does not have insomnia.     Allergies  Allergen Reactions  . Ace Inhibitors Cough  . Amiodarone Nausea Only and Nausea And Vomiting    Has tried med on 2 different occasions with same reaction     Patient Active Problem List   Diagnosis Date Noted  . Iron deficiency anemia   . Acute posthemorrhagic anemia 10/20/2016  . Gastritis 10/20/2016  . History of esophagogastroduodenoscopy (EGD) 10/20/2016  . Leg cramping 10/20/2016  . GI bleed 10/18/2016  . Pain in the groin, right 08/11/2016  . S/P ablation of atrial fibrillation 08/11/2016  . Thrombocytopenia ([place]) 08/09/2016  . Diabetes mellitus, type II ([place]) 07/11/2013  . Chronic kidney disease, stage III (moderate) 07/11/2013  . Pure hypercholesterolemia 07/11/2013  . Cardiac pacemaker in situ 05/31/2011  . SA node dysfunction ([place]) 05/31/2011  . Coronary artery disease 03/17/2011  . Dyslipidemia  03/17/2011  . Hypertension 03/17/2011  . Mitral regurgitation 03/17/2011  . Nonischemic cardiomyopathy ([place]) 03/17/2011  . Current use of long term anticoagulation 09/20/2010  . MITRAL  VALVE DISORDERS 08/09/2010  . Persistent atrial fibrillation ([place]) 08/09/2010  . CHRONIC SYSTOLIC HEART FAILURE 40/98/1191  . CHEST PAIN 06/29/2010     Past Medical History:  Diagnosis Date  . A-fib ([place])    failed amio due to Gi side effects (in [place])   . CAD (coronary artery disease)    non-obstructive CAD on cath January 2012. mLAD 40-50%  . Chest pain   . CHF (congestive heart failure) ([place])    due to recurrent NICM echo 06/2010: EF 20-25% mod-severe MR, moderate TR  . Diabetes mellitus   . GI bleed   . Hyperlipidemia   . Hypertension   . S/P atrioventricular nodal ablation      Past Surgical History:  Procedure Laterality Date  . CARDIAC CATHETERIZATION    . COLONOSCOPY WITH PROPOFOL N/A 11/07/2016   Procedure: COLONOSCOPY WITH PROPOFOL;  Surgeon: [name], MD;  [facility] ENDOSCOPY;  Service: Endoscopy;  Laterality: N/A;  . ESOPHAGOGASTRODUODENOSCOPY (EGD) WITH PROPOFOL N/A 10/20/2016   Procedure: ESOPHAGOGASTRODUODENOSCOPY (EGD) WITH PROPOFOL;  Surgeon: [name], MD;  Location: [facility] ENDOSCOPY;  Service: Endoscopy;  Laterality: N/A;  . GIVENS CAPSULE STUDY N/A 11/07/2016   Procedure: GIVENS CAPSULE STUDY;  Surgeon: [name], MD;  Location[facility] ENDOSCOPY;  Service: Endoscopy;  Laterality: N/A;  . KNEE ARTHROSCOPY     2009    Social History   Socioeconomic History  . Marital status: Married    Spouse name: Not on file  . Number of children: Not on file  . Years of education: Not on file  . Highest education level: Not on file  Occupational History  . Not on file  Tobacco Use  . Smoking status: Never Smoker  . Smokeless tobacco: Never Used  Substance and Sexual Activity  . Alcohol use: No  . Drug use: No  . Sexual activity: Not on file  Other Topics Concern  . Not on file  Social History Narrative   Retired    Married    Tobacco Use - No.    Alcohol Use - no   Regular Exercise - no   Drug Use - no   Smoking Status:  never   Does Patient  Exercise:  no   Drug Use:  no   Social Determinants of Radio broadcast assistant Strain:   . Difficulty of Paying Living Expenses:   Food Insecurity:   . Worried About Charity fundraiser in the Last Year:   . Arboriculturist in the Last Year:   Transportation Needs:   . Film/video editor (Medical):   Marland Kitchen Lack of Transportation (Non-Medical):   Physical Activity:   . Days of Exercise per Week:   . Minutes of Exercise per Session:   Stress:   . Feeling of Stress :   Social Connections:   . Frequency of Communication with Friends and Family:   . Frequency of Social Gatherings with Friends and Family:   . Attends Religious Services:   . Active Member of Clubs or Organizations:   . Attends Archivist Meetings:   Marland Kitchen Marital Status:   Intimate Partner Violence:   . Fear of Current or Ex-Partner:   . Emotionally Abused:   Marland Kitchen Physically Abused:   . Sexually Abused:      Family History  Problem Relation Age of Onset  . Arrhythmia Mother 58  . Heart failure Mother        diastolic dysfunction  . Diabetes Mother   . Diabetes Sister        family history  . Hypertension Other        family history  . Diabetes Brother      Current Outpatient Medications:  .  calcium-vitamin D 250-100 MG-UNIT tablet, Take 1 tablet by mouth daily., Disp: , Rfl:  .  carvedilol (COREG) 25 MG tablet, Take 25 mg by mouth 2 (two) times daily with a meal., Disp: , Rfl:  .  Cholecalciferol (VITAMIN D3) 25 MCG (1000 UT) CAPS, Take 2,000 Units by mouth daily., Disp: , Rfl:  .  dofetilide (TIKOSYN) 125 MCG capsule, Take 125 mcg by mouth 2 (two) times daily., Disp: , Rfl:  .  furosemide (LASIX) 20 MG tablet, Take 20 mg by mouth See admin instructions. 3 nights weekly, Disp: , Rfl:  .  glucose blood test strip, Use OneTouch Verio test strips as instructed to check blood sugar twice daily., Disp: 200 each, Rfl: 2 .  Magnesium 500 MG CAPS, Take 1 capsule by mouth daily., Disp: , Rfl:  .   OneTouch Delica Lancets 69C MISC, 1 each by Does not apply route See admin instructions. Use OneTouch Delica lancets to check blood sugar 2-3 times daily., Disp: , Rfl:  .  rosuvastatin (CRESTOR) 20 MG tablet, Take 20 mg by mouth daily., Disp: , Rfl:  .  sacubitril-valsartan (ENTRESTO) 49-51 MG, Take 1 tablet by mouth 2 (two) times daily., Disp: , Rfl:  .  dapagliflozin propanediol (FARXIGA) 5 MG TABS tablet, Take 5 mg by mouth daily., Disp: 90 tablet, Rfl: 1 .  glimepiride (AMARYL) 2 MG tablet, Take 1 tablet (2 mg total) by mouth See admin instructions. Take 33m in the morning and 126min the evening, Disp: 180 tablet, Rfl: 1 .  metFORMIN (GLUCOPHAGE) 1000 MG tablet, Take 1 tablet (1,000 mg total) by mouth 2 (two) times daily with a meal., Disp: 180 tablet, Rfl: 3 .  sitaGLIPtin (JANUVIA) 100 MG tablet, Take 1 tablet (100 mg total) by mouth daily., Disp: 90 tablet, Rfl: 2   Physical exam:  Vitals:   09/13/19 1456  BP: (!) 125/51  Pulse: 62  Temp: 98.7 F (37.1 C)  TempSrc: Tympanic  Weight: 148 lb (67.1 kg)  Height: 4' 11.75" (1.518 m)   Physical Exam Constitutional:      General: She is not in acute distress. HENT:     Head: Normocephalic and atraumatic.  Eyes:     Pupils: Pupils are equal, round, and reactive to light.  Cardiovascular:     Rate and Rhythm: Normal rate and regular rhythm.     Heart sounds: Normal heart sounds.  Pulmonary:     Effort: Pulmonary effort is normal.     Breath sounds: Normal breath sounds.  Abdominal:     General: Bowel sounds are normal.     Palpations: Abdomen is soft.     Comments: No palpable splenomegaly  Musculoskeletal:     Cervical back: Normal range of motion.  Lymphadenopathy:     Comments: No palpable cervical or axillary adenopathy  Skin:    General: Skin is warm and dry.  Neurological:     Mental Status: She is alert and oriented to person, place, and time.        CMP Latest Ref Rng & Units 09/13/2019  Glucose 70 -  99 mg/dL  106(H)  BUN 8 - 23 mg/dL 21  Creatinine 0.44 - 1.00 mg/dL 0.90  Sodium 135 - 145 mmol/L 139  Potassium 3.5 - 5.1 mmol/L 3.6  Chloride 98 - 111 mmol/L 102  CO2 22 - 32 mmol/L 29  Calcium 8.9 - 10.3 mg/dL 9.1  Total Protein 6.5 - 8.1 g/dL 7.2  Total Bilirubin 0.3 - 1.2 mg/dL 0.5  Alkaline Phos 38 - 126 U/L 37(L)  AST 15 - 41 U/L 18  ALT 0 - 44 U/L 13   CBC Latest Ref Rng & Units 09/13/2019  WBC 4.0 - 10.5 K/uL 5.5  Hemoglobin 12.0 - 15.0 g/dL 10.0(L)  Hematocrit 36.0 - 46.0 % 31.7(L)  Platelets 150 - 400 K/uL 205     Assessment and plan- Patient is a 86 y.o. female referred for normocytic anemia and thrombocytopenia  1.  Normocytic anemia: On looking back at patient's prior labs dating back to 2007 2018 patient has always had a hemoglobin mainly between 10-11.  MCV has been normocytic.  I therefore suspect that she has an element of anemia of chronic disease.  However I will do a complete anemia work-up today including a CBC with differential, CMP, ferritin and iron studies, B12 and folate, TSH, B1, B6, reticulocyte count, haptoglobin and LDH as well as smear review, ANA comprehensive panel.  We will also check serum free light chains and multiple myeloma panel.  She has had some of this blood work checked in the recent past including B12 folate and TSH which was normal.  2.  Thrombocytopenia: This is a new finding as patient has not had any prior thrombocytopenia.  Will check smear review as well as HIV and hepatitis C testing and B12.  I will see the patient back in 1 weeks time for a video visit   Thank you for this kind referral and the opportunity to participate in the care of this patient   Visit Diagnosis 1. Thrombocytopenia ([place])   2. Normocytic anemia   3. Anemia of chronic disease     Dr. Randa Evens, MD, MPH [facility] at [facility] [phone] 09/17/2019 2:41 PM

## 2019-09-17 NOTE — Addendum Note (Signed)
Addended by: Dorethea Clan on: 09/17/2019 02:47 PM   Modules accepted: Orders

## 2019-09-17 NOTE — Progress Notes (Signed)
[name] MD, MRCP(U.K) [address]  [place]  [place], [place] [address]  Main: [phone]  Fax: [phone]   Primary Care Physician: Jodi Marble, MD  Primary Gastroenterologist:  Dr. [name]   Anemia   HPI: April Park is a 86 y.o. female   Summary of history :  I had seen her initially in May 2018 as an inpatient consult when she was admitted with symptomatic anemia while on Xarelto.  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  Subsequently performed a colonoscopy which was normal except for internal hemorrhoids.  I follow that up with a capsule study of the small bowel but surprisingly revealed roundworms.  Treated her with albendazole with significant improvement in her hemoglobin.  She lives in [place] and [place] the states on and off. 08/30/2019 hemoglobin 10 g with an MCV of 92 and a platelet count of 96.  08/14/2019: B12 normal, hepatic function panel normal, BMP normal.  No recent iron studies.  Interval history  08/23/2019-09/17/2019  Last visit felt she has gastroparesis due to PM rise in sugars coinciding with her nausea   09/13/2019:  Hb 10 grams with mcv 93, folate normal, HCV, HIV, ANA panel, B1, TSH, haptoglobin, reticulocyte count normal.  Iron studies are normal.  Ferritin 48.  Vitamin B12 453.  Evaluated by hematology ova and parasite x2 shows no ascariasis.  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  Trace RBCs in urine. Recent platelet count had improved to 205.  She recently underwent changes to her diabetes medication and she is doing better.  No nausea in the evenings.  She is planning to return back to [place] for a couple of months.  Current Outpatient Medications  Medication Sig Dispense Refill  . calcium-vitamin D 250-100 MG-UNIT tablet Take 1 tablet by mouth daily.    . carvedilol (COREG) 25 MG tablet Take 25 mg by mouth 2 (two) times daily with a meal.    .  Cholecalciferol (VITAMIN D3) 25 MCG (1000 UT) CAPS Take 2,000 Units by mouth daily.    . dapagliflozin propanediol (FARXIGA) 5 MG TABS tablet Take 5 mg by mouth daily. 90 tablet 1  . dofetilide (TIKOSYN) 125 MCG capsule Take 125 mcg by mouth 2 (two) times daily.    . furosemide (LASIX) 20 MG tablet Take 20 mg by mouth See admin instructions. 3 nights weekly    . glimepiride (AMARYL) 2 MG tablet Take 1 tablet (2 mg total) by mouth See admin instructions. Take 2mg  in the morning and 1mg  in the evening 180 tablet 1  . glucose blood test strip Use OneTouch Verio test strips as instructed to check blood sugar twice daily. 200 each 2  . Magnesium 500 MG CAPS Take 1 capsule by mouth daily.    . metFORMIN (GLUCOPHAGE) 1000 MG tablet Take 1 tablet (1,000 mg total) by mouth 2 (two) times daily with a meal. 180 tablet 3  . OneTouch Delica Lancets 67E MISC 1 each by Does not apply route See admin instructions. Use OneTouch Delica lancets to check blood sugar 2-3 times daily.    . rosuvastatin (CRESTOR) 20 MG tablet Take 20 mg by mouth daily.    . sacubitril-valsartan (ENTRESTO) 49-51 MG Take 1 tablet by mouth 2 (two) times daily.    . sitaGLIPtin (JANUVIA) 100 MG tablet Take 1 tablet (100 mg total) by mouth daily. 90 tablet 2   No current facility-administered  medications for this visit.    Allergies as of 09/17/2019 - Review Complete 09/16/2019  Allergen Reaction Noted  . Ace inhibitors Cough 06/28/2011  . Amiodarone Nausea Only and Nausea And Vomiting 07/22/2016    ROS:  General: Negative for anorexia, weight loss, fever, chills, fatigue, weakness. ENT: Negative for hoarseness, difficulty swallowing , nasal congestion. CV: Negative for chest pain, angina, palpitations, dyspnea on exertion, peripheral edema.  Respiratory: Negative for dyspnea at rest, dyspnea on exertion, cough, sputum, wheezing.  GI: See history of present illness. GU:  Negative for dysuria, hematuria, urinary incontinence,  urinary frequency, nocturnal urination.  Endo: Negative for unusual weight change.    Physical Examination:   There were no vitals taken for this visit.  General: Well-nourished, well-developed in no acute distress.  Eyes: No icterus. Conjunctivae pink. Neuro: Alert and oriented x 3.  Grossly intact. Psych: Alert and cooperative, normal mood and affect.   Imaging Studies: No results found.  Assessment and Plan:   April Park is a 86 y.o. y/o female  here to follow-up for anemia.  Duodenal AVM ablated in 2018 and was found to have roundworms on capsule study of small bowel.  Treated with albendazole and she returned to [place] at that time and did not have a follow-up hence.  Globin has been stable.  There was a concern at one point that her low platelet count was something new but when rechecked recently her platelet count is in the normal range.  At her initial visit 2 weeks back she had some nausea towards the later part of the day which was coinciding with an elevated blood sugar level which would fit the picture of gastroparesis.   Plan 1. Complete stool test evaluation by completing the last set of stool test for ova and parasites therefore would have completed 3 rounds of testing. 2.    Continue changed regimen for diabetes control. 3.  Continue PPI as she is on a blood thinner and history of anemia. 4.  Anemia appears to be of chronic disease etiology.  No evidence of iron deficiency and labs have been stable.  Dr [name]  MD,MRCP [facility]) Follow up in as needed

## 2019-09-18 LAB — VITAMIN B1: Vitamin B1 (Thiamine): 93.1 nmol/L (ref 66.5–200.0)

## 2019-09-20 ENCOUNTER — Inpatient Hospital Stay: Payer: Medicare Other | Admitting: Oncology

## 2019-09-20 ENCOUNTER — Telehealth: Payer: Self-pay

## 2019-09-20 LAB — VITAMIN B6: Vitamin B6: 3.4 ug/L (ref 2.0–32.8)

## 2019-09-20 NOTE — Telephone Encounter (Signed)
I wanted to go over patient's chart and her husband-Mr. Tanney cancelled today's appointment. He stated that his wife did not need appointment and if his daughter-Kalsoom Heffner had any questions, then she would call us. Then he hung up the phone. This information was mentioned to physician.

## 2019-12-03 ENCOUNTER — Ambulatory Visit: Payer: Medicare Other | Admitting: Gastroenterology

## 2020-02-03 ENCOUNTER — Ambulatory Visit: Payer: Medicare Other | Admitting: Endocrinology

## 2021-03-08 LAB — LIPID PANEL: LDL Cholesterol: 45

## 2021-03-08 LAB — BASIC METABOLIC PANEL
Creatinine: 1.2 — AB (ref 0.5–1.1)
Glucose: 134

## 2021-03-08 LAB — HEMOGLOBIN A1C: Hemoglobin A1C: 6.8

## 2021-03-10 ENCOUNTER — Other Ambulatory Visit: Payer: Self-pay

## 2021-03-10 ENCOUNTER — Ambulatory Visit (INDEPENDENT_AMBULATORY_CARE_PROVIDER_SITE_OTHER): Payer: Medicare Other | Admitting: Endocrinology

## 2021-03-10 VITALS — BP 102/68 | HR 64 | Ht 63.0 in | Wt 142.6 lb

## 2021-03-10 DIAGNOSIS — E782 Mixed hyperlipidemia: Secondary | ICD-10-CM

## 2021-03-10 DIAGNOSIS — E1165 Type 2 diabetes mellitus with hyperglycemia: Secondary | ICD-10-CM | POA: Diagnosis not present

## 2021-03-10 DIAGNOSIS — E1142 Type 2 diabetes mellitus with diabetic polyneuropathy: Secondary | ICD-10-CM

## 2021-03-10 NOTE — Progress Notes (Signed)
Patient ID: April Park, female   DOB: August 09, 1931, 87 y.o.   MRN: [id number]           Reason for Appointment: Follow-up for Type 2 Diabetes  Referring physician: Dr. Neoma Laming   History of Present Illness:          Date of diagnosis of type 2 diabetes mellitus: 1998       Background history: She had been on metformin and subsequently Amaryl as initial treatments with fair control In 2011 because of relatively high readings Januvia was added Regimen has been continued unchanged subsequently Control has been variable with A1c as high as 8% in 2015  Recent history:    Most recent A1c is 6.8 compared to 7.7 in 2021    Non-insulin hypoglycemic drugs: Januvia 100 mg, Amaryl 2 mg twice daily,  Metformin 500 mg twice daily, Farxiga 5 mg daily  Current management, blood sugar patterns and problems identified: She was told to restart her Amaryl 2 mg twice daily on her last visit  Not clear if she has been checking her sugars at all and appears to have started checking them again from 9/12  However her One Touch ultra meter test strips are dated 2013 and giving her falsely high readings averaging 348 with lowest reading 290 She has no symptoms of polyuria or polydipsia except when she takes Lasix  No hypoglycemic symptoms, she does appear to have occasional nonspecific sweating without weakness She told her lab glucose in the afternoon at PCP office was 86 and today after breakfast it is 189 Her weight is down about 11 pounds        Side effects from medications have been: None     Meal times are:  Breakfast is at 9-10 AM, lunch: Only a snack, dinner: 6 PM  Typical meal intake: Breakfast is bread with egg, lunch is usually toast with tea, dinner is Roti, meat or lentils   Snacks are mostly fruit             Exercise: Minimal walking  Glucose monitoring:  done about 2 times a day         Glucometer: One Touch ultra 2     Blood Glucose readings by time of day as above  PREVIOUS  readings:  PRE-MEAL Fasting Lunch Dinner Bedtime Overall  Glucose range:  111-150  117, 180    111-238  Mean/median:  138     148   POST-MEAL PC Breakfast PC Lunch PC Dinner  Glucose range:  138-238  133, 120   Mean/median:        Dietician consultations: None  Weight history:  Wt Readings from Last 3 Encounters:  03/10/21 142 lb 9.6 oz (64.7 kg)  09/17/19 153 lb 6.4 oz (69.6 kg)  09/16/19 151 lb 9.6 oz (68.8 kg)    Glycemic control: 7.7   Lab Results  Component Value Date   HGBA1C 7.7 08/14/2019   HGBA1C 6.7 06/19/2019   HGBA1C 6.7 (H) 10/18/2016   Lab Results  Component Value Date   LDLCALC 104 06/19/2019   CREATININE 0.90 09/13/2019   No results found for: MICRALBCREAT  Lab Results  Component Value Date   FRUCTOSAMINE 323 (H) 07/29/2019    No visits with results within 1 Week(s) from this visit.  Latest known visit with results is:  Appointment on 09/13/2019  Component Date Value Ref Range Status   Kappa free light chain 09/13/2019 43.8 (A) 3.3 - 19.4 mg/L Final  Lambda free light chains 09/13/2019 32.7 (A) 5.7 - 26.3 mg/L Final   Kappa, lambda light chain ratio 09/13/2019 1.34  0.26 - 1.65 Final   Comment: (NOTE) Performed At: [facility] [address] [place], [place] [id number] Rush Farmer MD [id number]    HCV Ab 09/13/2019 NON REACTIVE  NON REACTIVE Final   Comment: (NOTE) Nonreactive HCV antibody screen is consistent with no HCV infections,  unless recent infection is suspected or other evidence exists to indicate HCV infection. Performed at [facility], [place] [address], [place], [place] [address]    HIV Screen 4th Generation wRfx 09/13/2019 NON REACTIVE  NON REACTIVE Final   Performed at [facility], [place] [address] [place], [place] [address]   ds DNA Ab 09/13/2019 5  0 - 9 IU/mL Final   Comment: (NOTE)                                   Negative      <5                                   Equivocal  5 - 9                                    Positive      >9    Ribonucleic Protein 09/13/2019 0.6  0.0 - 0.9 AI Final   ENA SM Ab Ser-aCnc 09/13/2019 <0.2  0.0 - 0.9 AI Final   Scleroderma (Scl-70) (ENA) Antibod* 09/13/2019 <0.2  0.0 - 0.9 AI Final   SSA (Ro) (ENA) Antibody, IgG 09/13/2019 <0.2  0.0 - 0.9 AI Final   SSB (La) (ENA) Antibody, IgG 09/13/2019 <0.2  0.0 - 0.9 AI Final   Chromatin Ab SerPl-aCnc 09/13/2019 <0.2  0.0 - 0.9 AI Final   Anti JO-1 09/13/2019 <0.2  0.0 - 0.9 AI Final   Centromere Ab Screen 09/13/2019 <0.2  0.0 - 0.9 AI Final   See below: 09/13/2019 Comment   Final   Comment: (NOTE) Autoantibody                       Disease Association ------------------------------------------------------------                        Condition                  Frequency ---------------------   ------------------------   --------- Antinuclear Antibody,    SLE, mixed connective Direct (ANA-D)           tissue diseases ---------------------   ------------------------   --------- dsDNA                    SLE                        40 - 60% ---------------------   ------------------------   --------- Chromatin                Drug induced SLE                90%  SLE                        48 - 97% ---------------------   ------------------------   --------- SSA (Ro)                 SLE                        25 - 35%                         Sjogren's Syndrome         40 - 70%                         Neonatal Lupus                 100% ---------------------   ------------------------   --------- SSB (La)                 SLE                                                       10%                         Sjogren's Syndrome              30% ---------------------   -----------------------    --------- Sm (anti-Smith)          SLE                        15 - 30% ---------------------   -----------------------    --------- RNP                      Mixed Connective Tissue                          Disease                         95% (U1 nRNP,                SLE                        30 - 50% anti-ribonucleoprotein)  Polymyositis and/or                         Dermatomyositis                 20% ---------------------   ------------------------   --------- Scl-70 (antiDNA          Scleroderma (diffuse)      20 - 35% topoisomerase)           Crest                           13% ---------------------   ------------------------   --------- Jo-1                     Polymyositis and/or  Dermatomyositis            20 - 40% ---------------------   ------------------------   --------- Centromere B             Scleroderma -                           Crest                         variant                         80% Performed At: [facility] [place], [place] [id number] Rush Farmer MD [id number]    Vitamin B6 09/13/2019 3.4  2.0 - 32.8 ug/L Final   Comment: (NOTE) This test was developed and its performance characteristics determined by [facility]. It has not been cleared or approved by the Food and Drug Administration. Performed At: [facility] [place], [place] [id number] Rush Farmer MD [id number]:[id number]    Vitamin B1 (Thiamine) 09/13/2019 93.1  66.5 - 200.0 nmol/L Final   Comment: (NOTE) This test was developed and its performance characteristics determined by [facility]. It has not been cleared or approved by the Food and Drug Administration. Performed At: [facility] [place], [place] [id number] Rush Farmer MD [id number]    TSH 09/13/2019 1.057  0.350 - 4.500 uIU/mL Final   Comment: Performed by a 3rd Generation assay with a functional sensitivity of <=0.01 uIU/mL. Performed at [facility], [address] [place], [place] [address]    Haptoglobin 09/13/2019 188  41 - 333 mg/dL Final   Comment: (NOTE) Performed At: [facility] [place], [place] [id number] Rush Farmer MD [id number]    IgG (Immunoglobin G), Serum 09/13/2019 1,021  586 - 1,602 mg/dL Final   IgA 09/13/2019 221  64 - 422 mg/dL Final   IgM (Immunoglobulin M), Srm 09/13/2019 53  26 - 217 mg/dL Final   Total Protein ELP 09/13/2019 6.6  6.0 - 8.5 g/dL Corrected   Albumin SerPl Elph-Mcnc 09/13/2019 3.5  2.9 - 4.4 g/dL Corrected   Alpha 1 09/13/2019 0.2  0.0 - 0.4 g/dL Corrected   Alpha2 Glob SerPl Elph-Mcnc 09/13/2019 0.9  0.4 - 1.0 g/dL Corrected   B-Globulin SerPl Elph-Mcnc 09/13/2019 1.0  0.7 - 1.3 g/dL Corrected   Gamma Glob SerPl Elph-Mcnc 09/13/2019 1.0  0.4 - 1.8 g/dL Corrected   M Protein SerPl Elph-Mcnc 09/13/2019 Not Observed  Not Observed g/dL Corrected   Globulin, Total 09/13/2019 3.1  2.2 - 3.9 g/dL Corrected   Albumin/Glob SerPl 09/13/2019 1.2  0.7 - 1.7 Corrected   IFE 1 09/13/2019 Comment   Corrected   Comment: (NOTE) The immunofixation pattern appears unremarkable. Evidence of monoclonal protein is not apparent.    Please Note 09/13/2019 Comment   Corrected   Comment: (NOTE) Protein electrophoresis scan will follow via computer, mail, or courier delivery. Performed At: [facility] [place], [place] [id number] Rush Farmer MD [id number]    Retic Ct Pct 09/13/2019 1.8  0.4 - 3.1 % Final   RBC. 09/13/2019 3.39 (A) 3.87 - 5.11 MIL/uL Final   Retic Count, Absolute 09/13/2019 60.3  19.0 - 186.0 K/uL Final   Immature Retic Fract 09/13/2019 12.9  2.3 - 15.9 % Final   Performed at [facility], [address] [place], [place]  [address]   Iron 09/13/2019 66  28 - 170 ug/dL Final   TIBC 09/13/2019 339  250 - 450 ug/dL Final   Saturation Ratios 09/13/2019 20  10.4 - 31.8 % Final   UIBC 09/13/2019 273  ug/dL Final   Performed at [facility], [address] [place], [place] [address]   Ferritin 09/13/2019 48  11 - 307 ng/mL Final   Performed at [facility], [address]  [place], [place] [address]   Vitamin B-12 09/13/2019 453  180 - 914 pg/mL Final   Comment: (NOTE) This assay is not validated for testing neonatal or myeloproliferative syndrome specimens for Vitamin B12 levels. Performed at [facility], [place] [address] [place], [place] [address]    Folate 09/13/2019 15.5  >5.9 ng/mL Final   Performed at [facility], [place], [place] [address]   Sodium 09/13/2019 139  135 - 145 mmol/L Final   Potassium 09/13/2019 3.6  3.5 - 5.1 mmol/L Final   Chloride 09/13/2019 102  98 - 111 mmol/L Final   CO2 09/13/2019 29  22 - 32 mmol/L Final   Glucose, Bld 09/13/2019 106 (A) 70 - 99 mg/dL Final   Glucose reference range applies only to samples taken after fasting for at least 8 hours.   BUN 09/13/2019 21  8 - 23 mg/dL Final   Creatinine, Ser 09/13/2019 0.90  0.44 - 1.00 mg/dL Final   Calcium 09/13/2019 9.1  8.9 - 10.3 mg/dL Final   Total Protein 09/13/2019 7.2  6.5 - 8.1 g/dL Final   Albumin 09/13/2019 4.0  3.5 - 5.0 g/dL Final   AST 09/13/2019 18  15 - 41 U/L Final   ALT 09/13/2019 13  0 - 44 U/L Final   Alkaline Phosphatase 09/13/2019 37 (A) 38 - 126 U/L Final   Total Bilirubin 09/13/2019 0.5  0.3 - 1.2 mg/dL Final   GFR calc non Af Amer 09/13/2019 58 (A) >60 mL/min Final   GFR calc Af Amer 09/13/2019 >60  >60 mL/min Final   Anion gap 09/13/2019 8  5 - 15 Final   Performed at [facility], [place], [place] [address]   WBC 09/13/2019 5.5  4.0 - 10.5 K/uL Final   RBC 09/13/2019 3.38 (A) 3.87 - 5.11 MIL/uL Final   Hemoglobin 09/13/2019 10.0 (A) 12.0 - 15.0 g/dL Final   HCT 09/13/2019 31.7 (A) 36.0 - 46.0 % Final   MCV 09/13/2019 93.8  80.0 - 100.0 fL Final   MCH 09/13/2019 29.6  26.0 - 34.0 pg Final   MCHC 09/13/2019 31.5  30.0 - 36.0 g/dL Final   RDW 09/13/2019 16.0 (A) 11.5 - 15.5 % Final   Platelets 09/13/2019 205  150 - 400 K/uL Final   nRBC 09/13/2019 0.0  0.0 - 0.2 % Final   Neutrophils Relative %  09/13/2019 53  % Final   Neutro Abs 09/13/2019 2.9  1.7 - 7.7 K/uL Final   Lymphocytes Relative 09/13/2019 33  % Final   Lymphs Abs 09/13/2019 1.8  0.7 - 4.0 K/uL Final   Monocytes Relative 09/13/2019 10  % Final   Monocytes Absolute 09/13/2019 0.5  0.1 - 1.0 K/uL Final   Eosinophils Relative 09/13/2019 2  % Final   Eosinophils Absolute 09/13/2019 0.1  0.0 - 0.5 K/uL Final   Basophils Relative 09/13/2019 1  % Final   Basophils Absolute 09/13/2019 0.0  0.0 - 0.1 K/uL Final   Immature Granulocytes 09/13/2019  1  % Final   Abs Immature Granulocytes 09/13/2019 0.05  0.00 - 0.07 K/uL Final   Performed at [facility], [address] [place], [place] [address]   WBC Morphology 09/13/2019 MORPHOLOGY UNREMARKABLE   Final   RBC Morphology 09/13/2019 OVALOCYTES   Final   Tech Review 09/13/2019 Normal platelet morphology   Final   Comment: PLATELETS APPEAR ADEQUATE Performed at [facility], [address] [place], [place] [address]     Allergies as of 03/10/2021       Reactions   Ace Inhibitors Cough   Amiodarone Nausea Only, Nausea And Vomiting   Has tried med on 2 different occasions with same reaction         Medication List        Accurate as of March 10, 2021  1:52 PM. If you have any questions, ask your nurse or doctor.          STOP taking these medications    amiodarone 200 MG tablet Commonly known as: PACERONE Stopped by: Elayne Snare, MD   dofetilide 125 MCG capsule Commonly known as: TIKOSYN Stopped by: Elayne Snare, MD   rosuvastatin 20 MG tablet Commonly known as: CRESTOR Stopped by: Elayne Snare, MD       TAKE these medications    calcium-vitamin D 250-100 MG-UNIT tablet Take 1 tablet by mouth daily.   carvedilol 25 MG tablet Commonly known as: COREG Take 25 mg by mouth 2 (two) times daily with a meal.   Entresto 49-51 MG Generic drug: sacubitril-valsartan Take 1 tablet by mouth 2 (two) times daily.   Farxiga 5 MG Tabs  tablet Generic drug: dapagliflozin propanediol Take 5 mg by mouth daily.   furosemide 20 MG tablet Commonly known as: LASIX Take 20 mg by mouth See admin instructions. 3 nights weekly   glimepiride 2 MG tablet Commonly known as: AMARYL Take 1 tablet (2 mg total) by mouth See admin instructions. Take 2mg  in the morning and 1mg  in the evening   glucose blood test strip Use OneTouch Verio test strips as instructed to check blood sugar twice daily.   Magnesium 500 MG Caps Take 1 capsule by mouth daily.   metFORMIN 1000 MG tablet Commonly known as: GLUCOPHAGE Take 1 tablet (1,000 mg total) by mouth 2 (two) times daily with a meal.   metFORMIN 500 MG tablet Commonly known as: GLUCOPHAGE Take 500 mg by mouth 2 (two) times daily.   OneTouch Delica Lancets 18H Misc 1 each by Does not apply route See admin instructions. Use OneTouch Delica lancets to check blood sugar 2-3 times daily.   pantoprazole 40 MG tablet Commonly known as: PROTONIX Take 40 mg by mouth daily.   sitaGLIPtin 100 MG tablet Commonly known as: Januvia Take 1 tablet (100 mg total) by mouth daily.   Vitamin D3 25 MCG (1000 UT) Caps Take 2,000 Units by mouth daily.   Xarelto 15 MG Tabs tablet Generic drug: Rivaroxaban Take 15 mg by mouth daily.        Allergies:  Allergies  Allergen Reactions   Ace Inhibitors Cough   Amiodarone Nausea Only and Nausea And Vomiting    Has tried med on 2 different occasions with same reaction     Past Medical History:  Diagnosis Date   A-fib ([place])    failed amio due to Gi side effects (in [place])    CAD (coronary artery disease)    non-obstructive CAD on cath January 2012. mLAD 40-50%  Chest pain    CHF (congestive heart failure) ([place])    due to recurrent NICM echo 06/2010: EF 20-25% mod-severe MR, moderate TR   Diabetes mellitus    GI bleed    Hyperlipidemia    Hypertension    S/P atrioventricular nodal ablation     Past Surgical History:  Procedure  Laterality Date   CARDIAC CATHETERIZATION     COLONOSCOPY WITH PROPOFOL N/A 11/07/2016   Procedure: COLONOSCOPY WITH PROPOFOL;  Surgeon: [name], MD;  Location: [facility] ENDOSCOPY;  Service: Endoscopy;  Laterality: N/A;   ESOPHAGOGASTRODUODENOSCOPY (EGD) WITH PROPOFOL N/A 10/20/2016   Procedure: ESOPHAGOGASTRODUODENOSCOPY (EGD) WITH PROPOFOL;  Surgeon: [name], MD;  Location: [facility] ENDOSCOPY;  Service: Endoscopy;  Laterality: N/A;   GIVENS CAPSULE STUDY N/A 11/07/2016   Procedure: GIVENS CAPSULE STUDY;  Surgeon: [name], MD;  Location: [facility] ENDOSCOPY;  Service: Endoscopy;  Laterality: N/A;   KNEE ARTHROSCOPY     2009    Family History  Problem Relation Age of Onset   Arrhythmia Mother 71   Heart failure Mother        diastolic dysfunction   Diabetes Mother    Diabetes Sister        family history   Hypertension Other        family history   Diabetes Brother     Social History:  reports that she has never smoked. She has never used smokeless tobacco. She reports that she does not drink alcohol and does not use drugs.   Review of Systems     Lipid history: Was on Crestor, this is apparently on hold recently because of some joint and muscle pains and most recent LDL was 45 done this week    Lab Results  Component Value Date   [place] 104 06/19/2019           Hypertension: Has been treated with carvedilol, also on Entresto for cardiomyopathy  Blood pressure being followed by her cardiologist  BP Readings from Last 3 Encounters:  03/10/21 102/68  09/17/19 123/72  09/16/19 110/78   Renal function has been fairly normal, Lasix reduced to 20 mg with starting Farxiga Most recent creatinine was 1.16  Lab Results  Component Value Date   CREATININE 0.90 09/13/2019   CREATININE 1.1 08/14/2019   CREATININE 1.09 07/29/2019     Most recent foot exam: 9/22  Currently known complications of diabetes: Neuropathy with symptoms of burning in the soles of the  feet   LABS:    Physical Examination:  BP 102/68   Pulse 64   Ht 5\' 3"  (1.6 m)   Wt 142 lb 9.6 oz (64.7 kg)   SpO2 99%   BMI 25.26 kg/m   Diabetic Foot Exam - Simple   Simple Foot Form Diabetic Foot exam was performed with the following findings: Yes   Visual Inspection No deformities, no ulcerations, no other skin breakdown bilaterally: Yes Sensation Testing Intact to touch and monofilament testing bilaterally: Yes Pulse Check See comments: Yes Comments DP pulses weak    No ankle edema  ASSESSMENT:  Diabetes type 2  See history of present illness for detailed discussion of current diabetes management, blood sugar patterns and problems identified  Her A1c is excellent at 6.8  Current treatment regimen is Amaryl, Farxiga, Januvia and Metformin  Blood sugars are difficult to assess because of falsely high readings from her expired test strips However as before she is mostly checking readings in the morning hours Does not appear to  be having any hypoglycemic symptoms from Amaryl 2 mg twice daily  She has lost weight since last year and not clear why Unable to exercise much because of exertional dyspnea and fatigue  Neuropathy: Still has symptoms but apparently did not benefit from trial of Lyrica previously  Hyperlipidemia: She apparently has been told to hold off on her Crestor, no history of CAD on her record   PLAN:    Glucose monitoring: She was shown how to use the One Touch Verio monitor in the office today and given a sample  She will need to check blood sugars by rotation at different times  to check blood sugars after meals consistently in the evening Discussed blood sugar targets at various times  No change in diabetes medicine regimen as yet. Consider gabapentin for neuropathy although she does not appear to be very symptomatic currently She will continue to work with her PCP and cardiologist regarding lipid management and blood pressure May  need to consider reducing Amaryl if she starts having any tendency to hypoglycemia   There are no Patient Instructions on file for this visit.   Total time including counseling for evaluating multiple problems and reviewing chart, recent lab results brought in = 30 minutes   Elayne Snare 03/10/2021, 1:52 PM   Note: This office note was prepared with Dragon voice recognition system technology. Any transcriptional errors that result from this process are unintentional.

## 2021-03-17 ENCOUNTER — Telehealth: Payer: Self-pay | Admitting: Endocrinology

## 2021-03-17 NOTE — Telephone Encounter (Signed)
MEDICATION: OneTouch Verio Test Strips and OneTouch Verio Lancets 30G  PHARMACY:  [facility] on [address] in [place] CONTACTED [place]?  yes  IS THIS A 90 DAY SUPPLY :  patient requesting 6 month supply because they are soon [place]  IS PATIENT OUT OF MEDICATION: na  IF NOT; HOW MUCH IS LEFT: unknown  LAST APPOINTMENT DATE: @9 /21/2022  NEXT APPOINTMENT DATE:@10 /19/2022  DO WE HAVE YOUR PERMISSION TO LEAVE A DETAILED MESSAGE?: yes  OTHER COMMENTS: patient requesting 6 month supply because they are [place] for [place] - patient test 2-3 times per day

## 2021-03-18 MED ORDER — GLUCOSE BLOOD VI STRP: ORAL_STRIP | 2 refills | Status: AC

## 2021-03-18 MED ORDER — ONETOUCH DELICA LANCETS 30G MISC: 1.0000 | 3 refills | Status: AC

## 2021-03-18 NOTE — Telephone Encounter (Signed)
Rx sent in to preferred pharmacy

## 2021-03-26 ENCOUNTER — Other Ambulatory Visit: Payer: Self-pay

## 2021-03-29 ENCOUNTER — Other Ambulatory Visit: Payer: Self-pay

## 2021-03-29 ENCOUNTER — Encounter: Payer: Self-pay | Admitting: Gastroenterology

## 2021-03-29 ENCOUNTER — Ambulatory Visit (INDEPENDENT_AMBULATORY_CARE_PROVIDER_SITE_OTHER): Payer: Medicare Other | Admitting: Gastroenterology

## 2021-03-29 VITALS — BP 103/66 | HR 72 | Temp 97.8°F | Ht 63.0 in | Wt 146.0 lb

## 2021-03-29 DIAGNOSIS — R899 Unspecified abnormal finding in specimens from other organs, systems and tissues: Secondary | ICD-10-CM | POA: Diagnosis not present

## 2021-03-29 DIAGNOSIS — K3184 Gastroparesis: Secondary | ICD-10-CM

## 2021-03-29 DIAGNOSIS — D513 Other dietary vitamin B12 deficiency anemia: Secondary | ICD-10-CM

## 2021-03-29 DIAGNOSIS — K581 Irritable bowel syndrome with constipation: Secondary | ICD-10-CM

## 2021-03-29 DIAGNOSIS — D500 Iron deficiency anemia secondary to blood loss (chronic): Secondary | ICD-10-CM | POA: Diagnosis not present

## 2021-03-29 NOTE — Addendum Note (Signed)
Addended by: Wayna Chalet on: 03/29/2021 02:45 PM   Modules accepted: Orders

## 2021-03-29 NOTE — Progress Notes (Signed)
[name] MD, MRCP(U.K) [address]  [place]  [place], [place] [address]  Main: [phone]  Fax: [phone]   Primary Care Physician: Jodi Marble, MD  Primary Gastroenterologist:  Dr. [name]   Chief complaint follow-up for anemia and gastroparesis.   HPI: April Park is a 87 y.o. female  Summary of history :   I had seen her initially in May 2018 as an inpatient consult when she was admitted with symptomatic anemia while on Xarelto.  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  Subsequently performed a colonoscopy which was normal except for internal hemorrhoids.  I followed that up with a capsule study of the small bowel but surprisingly revealed roundworms.  Treated her with albendazole with significant improvement in her hemoglobin.  She lives in [place] and [place] the states on and off. 08/30/2019 hemoglobin 10 g with an MCV of 92 and a platelet count of 96.  08/14/2019: B12 normal, hepatic function panel normal, BMP normal.  No recent iron studies.  Back in 2021 felt that she had gastroparesis due to elevated blood sugars.  Interval history 09/17/2019-03/29/2021  01/26/2021: Hemoglobin 10.9 g with an MCV of 90.  Creatinine 1.2 with a EGFR of 44  Has returned back from [place].  Having some cardiac issues being taken care of.  HbA1c has improved to 6.8 from over 7.  Hemoglobin stable at around 11 g.  Rather has improved from 10.4 g taken last year.  GFR also stable since 1 year.  She has only 3 bowel movements per week on the harder side.  When she does not have a good bowel movement seems to have abdominal pain.  Current Outpatient Medications  Medication Sig Dispense Refill   amiodarone (PACERONE) 200 MG tablet Take 1 tablet by mouth daily.     bisacodyl (DULCOLAX) 5 MG EC tablet Take 5 mg by mouth daily as needed for moderate constipation.     carvedilol (COREG) 25 MG tablet Take 25 mg by mouth 2 (two) times daily with a  meal.     Cholecalciferol (VITAMIN D3) 25 MCG (1000 UT) CAPS Take 2,000 Units by mouth daily.     ferrous sulfate 325 (65 FE) MG tablet Take 325 mg by mouth daily with breakfast.     furosemide (LASIX) 20 MG tablet Take 20 mg by mouth See admin instructions. 3 nights weekly     glimepiride (AMARYL) 2 MG tablet Take 1 tablet (2 mg total) by mouth See admin instructions. Take 42m in the morning and 161min the evening 180 tablet 1   glucose blood test strip Use OneTouch Verio test strips as instructed to check blood sugar twice daily. 200 each 2   Magnesium 500 MG CAPS Take 1 capsule by mouth daily.     metFORMIN (GLUCOPHAGE) 500 MG tablet Take 500 mg by mouth 2 (two) times daily.     OneTouch Delica Lancets 3026EISC 1 each by Does not apply route See admin instructions. Use OneTouch Delica lancets to check blood sugar 2-3 times daily. 100 each 3   pantoprazole (PROTONIX) 40 MG tablet Take 40 mg by mouth daily.     sacubitril-valsartan (ENTRESTO) 49-51 MG Take 1 tablet by mouth 2 (two) times daily.     sitaGLIPtin (JANUVIA) 100 MG tablet Take 1 tablet (100 mg total) by mouth daily. 90 tablet 2   XARELTO 15 MG TABS tablet Take 15 mg by mouth daily.  calcium-vitamin D 250-100 MG-UNIT tablet Take 1 tablet by mouth daily. (Patient not taking: Reported on 03/29/2021)     dapagliflozin propanediol (FARXIGA) 5 MG TABS tablet Take 5 mg by mouth daily. (Patient not taking: Reported on 03/29/2021) 90 tablet 1   rosuvastatin (CRESTOR) 40 MG tablet Take 40 mg by mouth daily. (Patient not taking: Reported on 03/29/2021)     No current facility-administered medications for this visit.    Allergies as of 03/29/2021 - Review Complete 03/10/2021  Allergen Reaction Noted   Ace inhibitors Cough 06/28/2011   Amiodarone Nausea Only and Nausea And Vomiting 07/22/2016    ROS:  General: Negative for anorexia, weight loss, fever, chills, fatigue, weakness. ENT: Negative for hoarseness, difficulty swallowing ,  nasal congestion. CV: Negative for chest pain, angina, palpitations, dyspnea on exertion, peripheral edema.  Respiratory: Negative for dyspnea at rest, dyspnea on exertion, cough, sputum, wheezing.  GI: See history of present illness. GU:  Negative for dysuria, hematuria, urinary incontinence, urinary frequency, nocturnal urination.  Endo: Negative for unusual weight change.    Physical Examination:   BP 103/66   Pulse 72   Temp 97.8 F (36.6 C) (Oral)   Ht _0  (1.6 m)   Wt 146 lb (66.2 kg)   BMI 25.86 kg/m   General: Well-nourished, well-developed in no acute distress.  Eyes: No icterus. Conjunctivae pink. Mouth: Oropharyngeal mucosa moist and pink , no lesions erythema or exudate. Neuro: Alert and oriented x 3.  Grossly intact. Skin: Warm and dry, no jaundice.   Psych: Alert and cooperative, normal mood and affect.   Imaging Studies: No results found.  Assessment and Plan:   April Park is a 87 y.o. y/o female  here to follow-up for anemia and gastroparesis.  Duodenal AVM ablated in 2018 and was found to have roundworms on capsule study of small bowel.  Treated with albendazole . Since her last visit her hemoglobin has been stable around 11 g.  Normocytic.  Her GFR is around 44.  It is possible that the degree of her anemia may be related to chronic kidney disease.  Question is if she would benefit from EPO.  I would recommend checking B12 and iron studies.  If they are normal then recommend discussing with nephrology if she would benefit from EPO.  It is also possible anemia is related to anemia of chronic disease rather than deficiency in EPO.  In addition she has abdominal pain suggestive of IBS constipation.  Likely secondary to longstanding diabetes leading to neuropathy.  She is presently taking Dulcolax.      Plan 1.  Check iron studies B12, reticulocyte to evaluate anemia 2.  Stop Dulcolax, on MiraLAX 1 capful daily.  Samples provided if it does not work will commence  on [facility] or [facility].  They will call me if its not working.     Dr [name]  MD,MRCP [facility]) Follow up in as needed

## 2021-03-30 LAB — B12 AND FOLATE PANEL
Folate: 11.5 ng/mL (ref >3.0)
Vitamin B-12: 684 pg/mL (ref 232–1245)

## 2021-03-30 LAB — IRON,TIBC AND FERRITIN PANEL
Ferritin: 43 ng/mL (ref 15–150)
Iron Saturation: 22 % (ref 15–55)
Iron: 63 ug/dL (ref 27–139)
Total Iron Binding Capacity: 291 ug/dL (ref 250–450)
UIBC: 228 ug/dL (ref 118–369)

## 2021-03-31 ENCOUNTER — Telehealth: Payer: Self-pay

## 2021-03-31 NOTE — Telephone Encounter (Signed)
Called patient back and left her a detailed message. I also left her my direct line in case she had further questions.

## 2021-03-31 NOTE — Telephone Encounter (Signed)
-----   Message from [name], MD sent at 03/30/2021 10:26 AM EDT ----- Herb Grays inform patient that iron levels and b12 are normal hence the anemia is not due to iron deficiency , could be related to kidneys or anemia of chronic disease. Suggest to discuss with Jodi Marble, MD if she would benefit from EPO shots   Dr [name] MD,MRCP [facility]) Gastroenterology/Hepatology Pager: [phone]

## 2021-04-07 ENCOUNTER — Ambulatory Visit (INDEPENDENT_AMBULATORY_CARE_PROVIDER_SITE_OTHER): Payer: Medicare Other | Admitting: Endocrinology

## 2021-04-07 ENCOUNTER — Other Ambulatory Visit: Payer: Self-pay

## 2021-04-07 VITALS — BP 118/58 | HR 76 | Ht 63.0 in | Wt 149.4 lb

## 2021-04-07 DIAGNOSIS — E1165 Type 2 diabetes mellitus with hyperglycemia: Secondary | ICD-10-CM | POA: Diagnosis not present

## 2021-04-07 NOTE — Progress Notes (Signed)
Patient ID: April Park, female   DOB: December 14, 1931, 87 y.o.   MRN: [id number]           Reason for Appointment: Follow-up for Type 2 Diabetes  Referring physician: Dr. Neoma Laming   History of Present Illness:          Date of diagnosis of type 2 diabetes mellitus: 1998       Background history: She had been on metformin and subsequently Amaryl as initial treatments with fair control In 2011 because of relatively high readings Januvia was added Regimen has been continued unchanged subsequently Control has been variable with A1c as high as 8% in 2015  Recent history:    Most recent A1c is 6.8 compared to 7.7 in 2021    Non-insulin hypoglycemic drugs: Januvia 100 mg, Amaryl 2 mg twice daily,  Metformin 500 mg twice daily, Farxiga 5 mg daily  Current management, blood sugar patterns and problems identified: She was given a new One Touch meter on her last visit in 5/22, her previous One Touch ultra meter test strips were extremely outdated and not giving her falsely high readings She is continuing her same regimen which was not changed on the last visit but more recently has started to have occasional low blood sugars as low as 52 About a week ago she was told by her PCP to cut her evening Amaryl to half a tablet Since then her blood sugars have not been documented to be low, last low reading was 64 overnight on 10/13 when she was n.p.o. for her procedure Blood sugar monitoring has been mostly in the mornings and only sporadic after meals She also has had some readings as high as 220 but these are either when she was told to hold some of her medications for pacemaker surgery or rebound from low sugar 1 morning She appears to be doing most of her readings recently before meals Previously had lost significant weight but is gaining back some now Previously had been eating only a snack at lunch but now is eating 3 full meals a day As before her exercise level is limited        Side  effects from medications have been: None     Meal times are:  Breakfast is at 9-10 AM, lunch: Only a snack, dinner: 6 PM  Typical meal intake: Breakfast is bread with egg, lunch is usually mixed meal, dinner is Roti, meat or lentils   Snacks are mostly fruit             Exercise: Minimal walking  Glucose monitoring:  done about 2 times a day         Glucometer: One Touch ultra 2     Blood Glucose readings by time of day with averages   PRE-MEAL Fasting Lunch Dinner Bedtime Overall  Glucose range:     52-220  Mean/median: 101 151 116  119   POST-MEAL PC Breakfast PC Lunch PC Dinner  Glucose range:     Mean/median:  186      Dietician consultations: None  Weight history:  Wt Readings from Last 3 Encounters:  04/07/21 149 lb 6.4 oz (67.8 kg)  03/29/21 146 lb (66.2 kg)  03/10/21 142 lb 9.6 oz (64.7 kg)    Glycemic control: 7.7   Lab Results  Component Value Date   HGBA1C 6.8 03/08/2021   HGBA1C 7.7 08/14/2019   HGBA1C 6.7 06/19/2019   Lab Results  Component Value Date   LDLCALC 45  03/08/2021   CREATININE 1.2 (A) 03/08/2021   No results found for: MICRALBCREAT  Lab Results  Component Value Date   FRUCTOSAMINE 323 (H) 07/29/2019    No visits with results within 1 Week(s) from this visit.  Latest known visit with results is:  Office Visit on 03/29/2021  Component Date Value Ref Range Status   Total Iron Binding Capacity 03/29/2021 291  250 - 450 ug/dL Final   UIBC 03/29/2021 228  118 - 369 ug/dL Final   Iron 03/29/2021 63  27 - 139 ug/dL Final   Iron Saturation 03/29/2021 22  15 - 55 % Final   Ferritin 03/29/2021 43  15 - 150 ng/mL Final   Vitamin B-12 03/29/2021 684  232 - 1,245 pg/mL Final   Folate 03/29/2021 11.5  >3.0 ng/mL Final   Comment: A serum folate concentration of less than 3.1 ng/mL is considered to represent clinical deficiency.     Allergies as of 04/07/2021       Reactions   Ace Inhibitors Cough   Amiodarone Nausea Only, Nausea And  Vomiting   Has tried med on 2 different occasions with same reaction         Medication List        Accurate as of April 07, 2021 10:38 AM. If you have any questions, ask your nurse or doctor.          amiodarone 200 MG tablet Commonly known as: PACERONE Take 1 tablet by mouth daily.   calcium-vitamin D 250-100 MG-UNIT tablet Take 1 tablet by mouth daily.   carvedilol 25 MG tablet Commonly known as: COREG Take 25 mg by mouth 2 (two) times daily with a meal.   Entresto 49-51 MG Generic drug: sacubitril-valsartan Take 1 tablet by mouth 2 (two) times daily.   Farxiga 5 MG Tabs tablet Generic drug: dapagliflozin propanediol Take 5 mg by mouth daily.   furosemide 20 MG tablet Commonly known as: LASIX Take 20 mg by mouth See admin instructions. 3 nights weekly   glimepiride 2 MG tablet Commonly known as: AMARYL Take 1 tablet (2 mg total) by mouth See admin instructions. Take 2mg  in the morning and 1mg  in the evening   glucose blood test strip Use OneTouch Verio test strips as instructed to check blood sugar twice daily.   Magnesium 500 MG Caps Take 1 capsule by mouth daily.   metFORMIN 500 MG tablet Commonly known as: GLUCOPHAGE Take 500 mg by mouth 2 (two) times daily.   OneTouch Delica Lancets 47Q Misc 1 each by Does not apply route See admin instructions. Use OneTouch Delica lancets to check blood sugar 2-3 times daily.   pantoprazole 40 MG tablet Commonly known as: PROTONIX Take 40 mg by mouth daily.   polyethylene glycol 17 g packet Commonly known as: MIRALAX / GLYCOLAX Take 17 g by mouth daily.   rosuvastatin 10 MG tablet Commonly known as: CRESTOR Take 10 mg by mouth daily.   sitaGLIPtin 100 MG tablet Commonly known as: Januvia Take 1 tablet (100 mg total) by mouth daily.   Xarelto 15 MG Tabs tablet Generic drug: Rivaroxaban Take 15 mg by mouth daily.        Allergies:  Allergies  Allergen Reactions   Ace Inhibitors Cough    Amiodarone Nausea Only and Nausea And Vomiting    Has tried med on 2 different occasions with same reaction     Past Medical History:  Diagnosis Date   A-fib ([place])    failed  amio due to Gi side effects (in [place])    CAD (coronary artery disease)    non-obstructive CAD on cath January 2012. mLAD 40-50%   Chest pain    CHF (congestive heart failure) ([place])    due to recurrent NICM echo 06/2010: EF 20-25% mod-severe MR, moderate TR   Diabetes mellitus    GI bleed    Hyperlipidemia    Hypertension    S/P atrioventricular nodal ablation     Past Surgical History:  Procedure Laterality Date   CARDIAC CATHETERIZATION     COLONOSCOPY WITH PROPOFOL N/A 11/07/2016   Procedure: COLONOSCOPY WITH PROPOFOL;  Surgeon: [name], MD;  Location[facility] ENDOSCOPY;  Service: Endoscopy;  Laterality: N/A;   ESOPHAGOGASTRODUODENOSCOPY (EGD) WITH PROPOFOL N/A 10/20/2016   Procedure: ESOPHAGOGASTRODUODENOSCOPY (EGD) WITH PROPOFOL;  Surgeon: [name], MD;  Location: [facility] ENDOSCOPY;  Service: Endoscopy;  Laterality: N/A;   GIVENS CAPSULE STUDY N/A 11/07/2016   Procedure: GIVENS CAPSULE STUDY;  Surgeon: [name], MD;  Location: [facility] ENDOSCOPY;  Service: Endoscopy;  Laterality: N/A;   KNEE ARTHROSCOPY     2009    Family History  Problem Relation Age of Onset   Arrhythmia Mother 78   Heart failure Mother        diastolic dysfunction   Diabetes Mother    Diabetes Sister        family history   Hypertension Other        family history   Diabetes Brother     Social History:  reports that she has never smoked. She has never used smokeless tobacco. She reports that she does not drink alcohol and does not use drugs.   Review of Systems     Lipid history: Was on Crestor, recently not taking this because of some joint and muscle pains  Her most recent LDL was 45     Lab Results  Component Value Date   LDLCALC 45 03/08/2021           Hypertension: Has been treated with carvedilol, also on  Entresto for cardiomyopathy  Blood pressure being followed by her cardiologist  BP Readings from Last 3 Encounters:  04/07/21 (!) 118/58  03/29/21 103/66  03/10/21 102/68   Renal function has been generally normal, Lasix reduced to 20 mg with starting Farxiga Most recent creatinine was 1.1  Lab Results  Component Value Date   CREATININE 1.2 (A) 03/08/2021   CREATININE 0.90 09/13/2019   CREATININE 1.1 08/14/2019     Most recent foot exam: 9/22  Currently known complications of diabetes: Neuropathy with symptoms of burning in the soles of the feet   LABS:    Physical Examination:  BP (!) 118/58   Pulse 76   Ht 5\' 3"  (1.6 m)   Wt 149 lb 6.4 oz (67.8 kg)   SpO2 99%   BMI 26.47 kg/m     ASSESSMENT:  Diabetes type 2  See history of present illness for detailed discussion of current diabetes management, blood sugar patterns and problems identified  Her A1c is last at 6.8  Current treatment regimen is Amaryl, Farxiga, Januvia and Metformin  Blood sugars are somewhat variable but generally well controlled now Some of her high readings either postprandial or related to holding her medication for her surgical procedure Also since she reduced her Amaryl in the evening to half a tablet she has not had any further hypoglycemia  Renal function stable with Farxiga  PLAN:    Glucose monitoring: She was reminded  to check more readings after meals to see the effect of various foods and not just before breakfast and dinner She will let us know if she has any further tendency to hypoglycemia   There are no Patient Instructions on file for this visit.      Elayne Snare 04/07/2021, 10:38 AM   Note: This office note was prepared with Dragon voice recognition system technology. Any transcriptional errors that result from this process are unintentional.

## 2021-04-07 NOTE — Patient Instructions (Signed)
Check blood sugars on waking up 3-4 days a week  Also check blood sugars about 2 hours after meals and do this after different meals by rotation  Recommended blood sugar levels on waking up are 90-130 and about 2 hours after meal is 130-160  Please bring your blood sugar monitor to each visit, thank you

## 2021-04-13 ENCOUNTER — Telehealth: Payer: Self-pay | Admitting: Gastroenterology

## 2021-04-13 NOTE — Telephone Encounter (Signed)
Called Dr. Bailey Mech office back and left them a detailed message letting them know that Dr. Vicente Males had seen the patient back in 03/29/2021 and everything was normal. Therefore, I requested to call me back so they could tell me the reason that they want the patient to be seen by Dr. Vicente Males again.

## 2021-04-13 NOTE — Telephone Encounter (Signed)
[facility] is calling to get an urgent appt. For this patient. I let them know that Dr. Vicente Males does not have anymore slots available this month. The nurse is requesting a call back to discuss options

## 2021-04-23 ENCOUNTER — Encounter: Payer: Self-pay | Admitting: *Deleted

## 2021-04-23 ENCOUNTER — Encounter: Payer: Medicare Other | Attending: Cardiovascular Disease | Admitting: *Deleted

## 2021-04-23 ENCOUNTER — Other Ambulatory Visit: Payer: Self-pay

## 2021-04-23 DIAGNOSIS — D631 Anemia in chronic kidney disease: Secondary | ICD-10-CM | POA: Insufficient documentation

## 2021-04-23 DIAGNOSIS — N189 Chronic kidney disease, unspecified: Secondary | ICD-10-CM | POA: Insufficient documentation

## 2021-04-23 DIAGNOSIS — I5022 Chronic systolic (congestive) heart failure: Secondary | ICD-10-CM

## 2021-04-23 DIAGNOSIS — E1122 Type 2 diabetes mellitus with diabetic chronic kidney disease: Secondary | ICD-10-CM | POA: Insufficient documentation

## 2021-04-23 DIAGNOSIS — D513 Other dietary vitamin B12 deficiency anemia: Secondary | ICD-10-CM | POA: Insufficient documentation

## 2021-04-23 DIAGNOSIS — D508 Other iron deficiency anemias: Secondary | ICD-10-CM | POA: Insufficient documentation

## 2021-04-23 NOTE — Progress Notes (Signed)
Initial orientation completed. Diagnosis can be found in Media Tab- Dr. Laurelyn Sickle office note 11/1. EP orientation scheduled for Thursday 11/17 at 2pm.

## 2021-04-27 NOTE — Telephone Encounter (Signed)
Dr. Vicente Males, this morning we received a referral from Dr. Elijio Miles stating that the patient had positive hemoccult cards. Please let me know if you want to see the patient in an appointment or just schedule her a colonoscopy. Please advise.

## 2021-04-29 NOTE — Telephone Encounter (Signed)
Pt scheduled for 11/11 at 10:45 per Dr Vicente Males

## 2021-04-30 ENCOUNTER — Other Ambulatory Visit: Payer: Self-pay

## 2021-04-30 ENCOUNTER — Encounter: Payer: Self-pay | Admitting: Gastroenterology

## 2021-04-30 ENCOUNTER — Ambulatory Visit (INDEPENDENT_AMBULATORY_CARE_PROVIDER_SITE_OTHER): Payer: Medicare Other | Admitting: Gastroenterology

## 2021-04-30 VITALS — BP 111/73 | HR 74 | Temp 98.3°F | Ht 63.0 in | Wt 147.8 lb

## 2021-04-30 DIAGNOSIS — D631 Anemia in chronic kidney disease: Secondary | ICD-10-CM | POA: Diagnosis not present

## 2021-04-30 DIAGNOSIS — D508 Other iron deficiency anemias: Secondary | ICD-10-CM

## 2021-04-30 DIAGNOSIS — N189 Chronic kidney disease, unspecified: Secondary | ICD-10-CM | POA: Diagnosis not present

## 2021-04-30 DIAGNOSIS — D513 Other dietary vitamin B12 deficiency anemia: Secondary | ICD-10-CM

## 2021-04-30 NOTE — Progress Notes (Signed)
April Bellows MD, MRCP(U.K) [address]  [place]  [place], [place] [address]  Main: [phone]  Fax: [phone]   Primary Care Physician: April Marble, MD  Primary Gastroenterologist:  Dr. Jonathon Park   Chief complaint : stool occult blood test positive   HPI: April Park is a 87 y.o. female   Summary of history :   I had seen her initially in May 2018 as an inpatient consult when she was admitted with symptomatic anemia while on Xarelto.  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  Subsequently performed a colonoscopy which was normal except for internal hemorrhoids.  I followed that up with a capsule study of the small bowel but surprisingly revealed roundworms.  Treated her with albendazole with significant improvement in her hemoglobin.  She lives in [place] and returns to the states on and off. 08/30/2019 hemoglobin 10 g with an MCV of 92 and a platelet count of 96.  08/14/2019: B12 normal, hepatic function panel normal, BMP normal.  No recent iron studies.  Back in 2021 felt that she had gastroparesis due to elevated blood sugars. 01/26/2021: Hemoglobin 10.9 g with an MCV of 90.  Creatinine 1.2 with a EGFR of 44  Interval history 03/29/2021-04/30/2021  03/29/2021: B12 684, iron studies normal    She has recently underwent an AICD placement.  Had a some abdominal discomfort for a few days which subsequently resolved.  At that point of time when she had a hemoglobin checked it had dropped a gram from her baseline to 9.5.  She also had stool occult blood testing done at that point of time which was positive.  Unclear why the stool occult blood testing was done.  She is not due for colon cancer screening as she is over the age of 70.  Also on the same set of labs that the creatinine had bumped to 1.48 baseline was lower at 1.2.  She does not see a nephrologist.  GFR was 35 presently no abdominal pain.  No other complaints.  She denies any overt  blood loss at this point of time.    BMP Latest Ref Rng & Units 03/08/2021 09/13/2019 08/14/2019  Glucose 70 - 99 mg/dL - 106(H) -  BUN 8 - 23 mg/dL - 21 40(A)  Creatinine 0.5 - 1.1 1.2(A) 0.90 1.1  Sodium 135 - 145 mmol/L - 139 139  Potassium 3.5 - 5.1 mmol/L - 3.6 4.3  Chloride 98 - 111 mmol/L - 102 103  CO2 22 - 32 mmol/L - 29 18  Calcium 8.9 - 10.3 mg/dL - 9.1 9.2     Has returned back from [place].  Having some cardiac issues being taken care of.  HbA1c has improved to 6.8 from over 7.  Hemoglobin stable at around 11 g.  Rather has improved from 10.4 g taken last year.  GFR also stable since 1 year.  She has only 3 bowel movements per week on the harder side.  When she does not have a good bowel movement seems to have abdominal pain.    Current Outpatient Medications  Medication Sig Dispense Refill   amiodarone (PACERONE) 200 MG tablet Take 1 tablet by mouth daily.     calcium-vitamin D 250-100 MG-UNIT tablet Take 1 tablet by mouth daily.     carvedilol (COREG) 12.5 MG tablet Take 12.5 mg by mouth 2 (two) times daily.     dapagliflozin propanediol (FARXIGA) 5 MG TABS tablet Take 5 mg  by mouth daily. 90 tablet 1   furosemide (LASIX) 20 MG tablet Take 20 mg by mouth as needed.     glimepiride (AMARYL) 2 MG tablet Take 1 tablet (2 mg total) by mouth See admin instructions. Take 55m in the morning and 143min the evening (Patient taking differently: Take 1 mg by mouth See admin instructions. Take 73m58mn the morning and 73mg26m the evening) 180 tablet 1   glucose blood test strip Use OneTouch Verio test strips as instructed to check blood sugar twice daily. 200 each 2   Magnesium 500 MG CAPS Take 1 capsule by mouth daily.     metFORMIN (GLUCOPHAGE) 500 MG tablet Take 500 mg by mouth 2 (two) times daily.     OneTouch Delica Lancets 30G 72CC 1 each by Does not apply route See admin instructions. Use OneTouch Delica lancets to check blood sugar 2-3 times daily. 100 each 3   pantoprazole  (PROTONIX) 40 MG tablet Take 40 mg by mouth 2 (two) times daily.     polyethylene glycol (MIRALAX / GLYCOLAX) 17 g packet Take 17 g by mouth daily.     rosuvastatin (CRESTOR) 10 MG tablet Take 10 mg by mouth daily.     sacubitril-valsartan (ENTRESTO) 49-51 MG Take 1 tablet by mouth 2 (two) times daily.     sitaGLIPtin (JANUVIA) 100 MG tablet Take 1 tablet (100 mg total) by mouth daily. (Patient taking differently: Take 50 mg by mouth daily.) 90 tablet 2   SLOW FE 142 (45 Fe) MG TBCR Take 1 tablet by mouth daily.     spironolactone (ALDACTONE) 25 MG tablet Take 25 mg by mouth daily.     XARELTO 15 MG TABS tablet Take 15 mg by mouth daily.     No current facility-administered medications for this visit.    Allergies as of 04/30/2021 - Review Complete 04/23/2021  Allergen Reaction Noted   Ace inhibitors Cough 06/28/2011   Amiodarone Nausea Only and Nausea And Vomiting 07/22/2016    ROS:  General: Negative for anorexia, weight loss, fever, chills, fatigue, weakness. ENT: Negative for hoarseness, difficulty swallowing , nasal congestion. CV: Negative for chest pain, angina, palpitations, dyspnea on exertion, peripheral edema.  Respiratory: Negative for dyspnea at rest, dyspnea on exertion, cough, sputum, wheezing.  GI: See history of present illness. GU:  Negative for dysuria, hematuria, urinary incontinence, urinary frequency, nocturnal urination.  Endo: Negative for unusual weight change.    Physical Examination:   BP 111/73   Pulse 74   Temp 98.3 F (36.8 C) (Oral)   Ht 5' 3"  (1.6 m)   Wt 147 lb 12.8 oz (67 kg)   BMI 26.18 kg/m   General: Well-nourished, well-developed in no acute distress.  Eyes: No icterus. Conjunctivae pink. Mouth: Oropharyngeal mucosa moist and pink , no lesions erythema or exudate. Neuro: Alert and oriented x 3.  Grossly intact. Skin: Warm and dry, no jaundice.   Psych: Alert and cooperative, normal mood and affect.   Imaging Studies: No results  found.  Assessment and Plan:   April Park 89 y47. y/o female  here to follow-up for anemia .  Duodenal AVM ablated in 2018 and was found to have roundworms on capsule study of small bowel.  Treated with albendazole .  Recently had an AICD placement following which her hemoglobin dropped to 9.5.  GFR has also dropped previously from 45-35 with a creatinine bumping to 1.48.  She has not seen any overt blood loss.  For some reason she has had a stool tested for blood which is positive.     Plan  1.  I explained to her and her husband that stool occult blood testing should not be done for anemia.  It is a test only for colon cancer screening and not indicated for evaluation of anemia.  In addition she has not seen any blood visible.  She is on Xarelto which makes her more likely to bleed.  Of note is that her GFR has declined further from 45 previously to 34 at this point of time with rise in her creatinine to 1.48.  Her iron studies in October were completely normal.  It is possible that she has either anemia of chronic disease or anemia from her kidney disease which is getting worse and may benefit from seeing a nephrologist and discussing about erythropoietin injections.  2.  I will check her hemoglobin today along with BMP again.  I will call her again in 2 weeks time.  With her cardiac comorbidities she would be at higher risk for any endoscopy procedure and I would not like to perform 1 unless absolutely indicated.     Dr April Bellows  MD,MRCP [facility]) Follow up in 2 weeks telephone visit

## 2021-04-30 NOTE — Addendum Note (Signed)
Addended by: Wayna Chalet on: 04/30/2021 11:05 AM   Modules accepted: Orders

## 2021-04-30 NOTE — Patient Instructions (Addendum)
Please go to [facility] today and have your blood drawn. You do not need an appointment.  [facility] Address: [address], [place], [place] [address]   We faxed the referral to Dr. Elwyn Lade office and they will contact you with an appointment. Dr. Anthonette Legato [phone] [address], [address] [place], [place] [address]  Constipation, Adult Constipation is when a person has trouble pooping (having a bowel movement). When you have this condition, you may poop fewer than 3 times a week. Your poop (stool) may also be dry, hard, or bigger than normal. Follow these instructions at home: Eating and drinking  Eat foods that have a lot of fiber, such as: Fresh fruits and vegetables. Whole grains. Beans. Eat less of foods that are low in fiber and high in fat and sugar, such as: [place] fries. Hamburgers. Cookies. Candy. Soda. Drink enough fluid to keep your pee (urine) pale yellow. General instructions Exercise regularly or as told by your doctor. Try to do 150 minutes of exercise each week. Go to the restroom when you feel like you need to poop. Do not hold it in. Take over-the-counter and prescription medicines only as told by your doctor. These include any fiber supplements. When you poop: Do deep breathing while relaxing your lower belly (abdomen). Relax your pelvic floor. The pelvic floor is a group of muscles that support the rectum, bladder, and intestines (as well as the uterus in women). Watch your condition for any changes. Tell your doctor if you notice any. Keep all follow-up visits as told by your doctor. This is important. Contact a doctor if: You have pain that gets worse. You have a fever. You have not pooped for 4 days. You vomit. You are not hungry. You lose weight. You are bleeding from the opening of the butt (anus). You have thin, pencil-like poop. Get help right away if: You have a fever, and your symptoms suddenly get  worse. You leak poop or have blood in your poop. Your belly feels hard or bigger than normal (bloated). You have very bad belly pain. You feel dizzy or you faint. Summary Constipation is when a person poops fewer than 3 times a week, has trouble pooping, or has poop that is dry, hard, or bigger than normal. Eat foods that have a lot of fiber. Drink enough fluid to keep your pee (urine) pale yellow. Take over-the-counter and prescription medicines only as told by your doctor. These include any fiber supplements. This information is not intended to replace advice given to you by your health care provider. Make sure you discuss any questions you have with your health care provider. Document Revised: 04/24/2019 Document Reviewed: 04/24/2019 Elsevier Patient Education  [place].

## 2021-05-06 ENCOUNTER — Other Ambulatory Visit: Payer: Self-pay

## 2021-05-06 VITALS — Ht 61.0 in | Wt 146.0 lb

## 2021-05-06 DIAGNOSIS — D631 Anemia in chronic kidney disease: Secondary | ICD-10-CM | POA: Diagnosis not present

## 2021-05-06 DIAGNOSIS — I5022 Chronic systolic (congestive) heart failure: Secondary | ICD-10-CM | POA: Diagnosis present

## 2021-05-06 DIAGNOSIS — E1122 Type 2 diabetes mellitus with diabetic chronic kidney disease: Secondary | ICD-10-CM | POA: Diagnosis not present

## 2021-05-06 DIAGNOSIS — N189 Chronic kidney disease, unspecified: Secondary | ICD-10-CM | POA: Diagnosis not present

## 2021-05-06 DIAGNOSIS — D513 Other dietary vitamin B12 deficiency anemia: Secondary | ICD-10-CM | POA: Diagnosis not present

## 2021-05-06 DIAGNOSIS — D508 Other iron deficiency anemias: Secondary | ICD-10-CM | POA: Diagnosis not present

## 2021-05-06 NOTE — Patient Instructions (Addendum)
Patient Instructions  Patient Details  Name: April Park MRN: [id number] Date of Birth: 04/09/1932 Referring Provider:  Dionisio David, MD  Below are your personal goals for exercise, nutrition, and risk factors. Our goal is to help you stay on track towards obtaining and maintaining these goals. We will be discussing your progress on these goals with you throughout the program.  Initial Exercise Prescription:  Initial Exercise Prescription - 05/06/21 1500       Date of Initial Exercise RX and Referring Provider   Date 05/06/21    Referring Provider Neoma Laming      Oxygen   Maintain Oxygen Saturation 88% or higher      Treadmill   MPH 1    Grade 0    Minutes 15    METs 1      NuStep   Level 1    SPM 80    Minutes 15    METs 1      REL-XR   Level 1    Speed 50    Minutes 15    METs 1      Biostep-RELP   Level 1    SPM 50    Minutes 15    METs 1      Track   Laps 5    Minutes 15    METs 1      Prescription Details   Frequency (times per week) 3    Duration Progress to 30 minutes of continuous aerobic without signs/symptoms of physical distress      Intensity   THRR 40-80% of Max Heartrate 96-121    Ratings of Perceived Exertion 11-13    Perceived Dyspnea 0-4      Resistance Training   Training Prescription Yes    Weight 2 lb    Reps 10-15             Exercise Goals: Frequency: Be able to perform aerobic exercise two to three times per week in program working toward 2-5 days per week of home exercise.  Intensity: Work with a perceived exertion of 11 (fairly light) - 15 (hard) while following your exercise prescription.  We will make changes to your prescription with you as you progress through the program.   Duration: Be able to do 30 to 45 minutes of continuous aerobic exercise in addition to a 5 minute warm-up and a 5 minute cool-down routine.   Nutrition Goals: Your personal nutrition goals will be established when you do your nutrition  analysis with the dietician.  The following are general nutrition guidelines to follow: Cholesterol < 200mg /day Sodium < 1500mg /day Fiber: Women over 50 yrs - 21 grams per day  Personal Goals:  Personal Goals and Risk Factors at Admission - 05/06/21 1454       Core Components/Risk Factors/Patient Goals on Admission    Weight Management Yes    Intervention Weight Management: Develop a combined nutrition and exercise program designed to reach desired caloric intake, while maintaining appropriate intake of nutrient and fiber, sodium and fats, and appropriate energy expenditure required for the weight goal.;Weight Management: Provide education and appropriate resources to help participant work on and attain dietary goals.    Admit Weight 146 lb (66.2 kg)    Goal Weight: Short Term 146 lb (66.2 kg)    Goal Weight: Long Term 146 lb (66.2 kg)    Expected Outcomes Short Term: Continue to assess and modify interventions until short term weight is achieved;Long Term: Adherence to  nutrition and physical activity/exercise program aimed toward attainment of established weight goal;Weight Maintenance: Understanding of the daily nutrition guidelines, which includes 25-35% calories from fat, 7% or less cal from saturated fats, less than 200mg  cholesterol, less than 1.5gm of sodium, & 5 or more servings of fruits and vegetables daily;Understanding recommendations for meals to include 15-35% energy as protein, 25-35% energy from fat, 35-60% energy from carbohydrates, less than 200mg  of dietary cholesterol, 20-35 gm of total fiber daily;Understanding of distribution of calorie intake throughout the day with the consumption of 4-5 meals/snacks    Heart Failure Yes    Intervention Provide a combined exercise and nutrition program that is supplemented with education, support and counseling about heart failure. Directed toward relieving symptoms such as shortness of breath, decreased exercise tolerance, and extremity  edema.    Expected Outcomes Improve functional capacity of life;Short term: Attendance in program 2-3 days a week with increased exercise capacity. Reported lower sodium intake. Reported increased fruit and vegetable intake. Reports medication compliance.;Short term: Daily weights obtained and reported for increase. Utilizing diuretic protocols set by physician.;Long term: Adoption of self-care skills and reduction of barriers for early signs and symptoms recognition and intervention leading to self-care maintenance.    Hypertension Yes    Intervention Provide education on lifestyle modifcations including regular physical activity/exercise, weight management, moderate sodium restriction and increased consumption of fresh fruit, vegetables, and low fat dairy, alcohol moderation, and smoking cessation.;Monitor prescription use compliance.    Expected Outcomes Short Term: Continued assessment and intervention until BP is < 140/14mm HG in hypertensive participants. < 130/6mm HG in hypertensive participants with diabetes, heart failure or chronic kidney disease.;Long Term: Maintenance of blood pressure at goal levels.    Lipids Yes    Intervention Provide education and support for participant on nutrition & aerobic/resistive exercise along with prescribed medications to achieve LDL 70mg , HDL >40mg .    Expected Outcomes Short Term: Participant states understanding of desired cholesterol values and is compliant with medications prescribed. Participant is following exercise prescription and nutrition guidelines.;Long Term: Cholesterol controlled with medications as prescribed, with individualized exercise RX and with personalized nutrition plan. Value goals: LDL < 70mg , HDL > 40 mg.             Tobacco Use Initial Evaluation: Social History   Tobacco Use  Smoking Status Never  Smokeless Tobacco Never    Exercise Goals and Review:  Exercise Goals     Row Name 05/06/21 1453              Exercise Goals   Increase Physical Activity Yes       Intervention Provide advice, education, support and counseling about physical activity/exercise needs.;Develop an individualized exercise prescription for aerobic and resistive training based on initial evaluation findings, risk stratification, comorbidities and participant's personal goals.       Expected Outcomes Short Term: Attend rehab on a regular basis to increase amount of physical activity.;Long Term: Add in home exercise to make exercise part of routine and to increase amount of physical activity.;Long Term: Exercising regularly at least 3-5 days a week.       Increase Strength and Stamina Yes       Intervention Provide advice, education, support and counseling about physical activity/exercise needs.;Develop an individualized exercise prescription for aerobic and resistive training based on initial evaluation findings, risk stratification, comorbidities and participant's personal goals.       Expected Outcomes Short Term: Increase workloads from initial exercise prescription for resistance, speed, and  METs.;Short Term: Perform resistance training exercises routinely during rehab and add in resistance training at home;Long Term: Improve cardiorespiratory fitness, muscular endurance and strength as measured by increased METs and functional capacity (6MWT)       Able to understand and use rate of perceived exertion (RPE) scale Yes       Intervention Provide education and explanation on how to use RPE scale       Expected Outcomes Short Term: Able to use RPE daily in rehab to express subjective intensity level;Long Term:  Able to use RPE to guide intensity level when exercising independently       Able to understand and use Dyspnea scale Yes       Intervention Provide education and explanation on how to use Dyspnea scale       Expected Outcomes Short Term: Able to use Dyspnea scale daily in rehab to express subjective sense of shortness of breath  during exertion;Long Term: Able to use Dyspnea scale to guide intensity level when exercising independently       Knowledge and understanding of Target Heart Rate Range (THRR) Yes       Intervention Provide education and explanation of THRR including how the numbers were predicted and where they are located for reference       Expected Outcomes Short Term: Able to state/look up THRR;Short Term: Able to use daily as guideline for intensity in rehab;Long Term: Able to use THRR to govern intensity when exercising independently       Able to check pulse independently Yes       Intervention Provide education and demonstration on how to check pulse in carotid and radial arteries.;Review the importance of being able to check your own pulse for safety during independent exercise       Expected Outcomes Short Term: Able to explain why pulse checking is important during independent exercise;Long Term: Able to check pulse independently and accurately       Understanding of Exercise Prescription Yes       Intervention Provide education, explanation, and written materials on patient's individual exercise prescription       Expected Outcomes Short Term: Able to explain program exercise prescription;Long Term: Able to explain home exercise prescription to exercise independently                Copy of goals given to participant.

## 2021-05-06 NOTE — Progress Notes (Signed)
Cardiac Individual Treatment Plan  Patient Details  Name: Trenee Igoe MRN: [id number] Date of Birth: 1932/03/06 Referring Provider:   Flowsheet Row Cardiac Rehab from 05/06/2021 in [facility] Cardiac and Pulmonary Rehab  Referring Provider Neoma Laming       Initial Encounter Date:  Flowsheet Row Cardiac Rehab from 05/06/2021 in [facility] Cardiac and Pulmonary Rehab  Date 05/06/21       Visit Diagnosis: Heart failure, chronic systolic ([place])  Patient's Home Medications on Admission:  Current Outpatient Medications:    amiodarone (PACERONE) 200 MG tablet, Take 1 tablet by mouth daily., Disp: , Rfl:    calcium-vitamin D 250-100 MG-UNIT tablet, Take 1 tablet by mouth daily., Disp: , Rfl:    carvedilol (COREG) 12.5 MG tablet, Take 12.5 mg by mouth 2 (two) times daily., Disp: , Rfl:    dapagliflozin propanediol (FARXIGA) 5 MG TABS tablet, Take 5 mg by mouth daily., Disp: 90 tablet, Rfl: 1   furosemide (LASIX) 20 MG tablet, Take 20 mg by mouth as needed., Disp: , Rfl:    glimepiride (AMARYL) 2 MG tablet, Take 1 tablet (2 mg total) by mouth See admin instructions. Take 2mg  in the morning and 1mg  in the evening (Patient taking differently: Take 1 mg by mouth See admin instructions. Take 1mg  in the morning and 1mg  in the evening), Disp: 180 tablet, Rfl: 1   glucose blood test strip, Use OneTouch Verio test strips as instructed to check blood sugar twice daily., Disp: 200 each, Rfl: 2   Magnesium 500 MG CAPS, Take 1 capsule by mouth daily., Disp: , Rfl:    metFORMIN (GLUCOPHAGE) 500 MG tablet, Take 500 mg by mouth 2 (two) times daily., Disp: , Rfl:    OneTouch Delica Lancets 69C MISC, 1 each by Does not apply route See admin instructions. Use OneTouch Delica lancets to check blood sugar 2-3 times daily., Disp: 100 each, Rfl: 3   pantoprazole (PROTONIX) 40 MG tablet, Take 40 mg by mouth 2 (two) times daily., Disp: , Rfl:    polyethylene glycol (MIRALAX / GLYCOLAX) 17 g packet, Take 17 g by mouth daily.,  Disp: , Rfl:    rosuvastatin (CRESTOR) 10 MG tablet, Take 10 mg by mouth daily., Disp: , Rfl:    sacubitril-valsartan (ENTRESTO) 49-51 MG, Take 1 tablet by mouth 2 (two) times daily., Disp: , Rfl:    sitaGLIPtin (JANUVIA) 100 MG tablet, Take 1 tablet (100 mg total) by mouth daily. (Patient taking differently: Take 50 mg by mouth daily.), Disp: 90 tablet, Rfl: 2   SLOW FE 142 (45 Fe) MG TBCR, Take 1 tablet by mouth daily., Disp: , Rfl:    spironolactone (ALDACTONE) 25 MG tablet, Take 25 mg by mouth daily., Disp: , Rfl:    XARELTO 15 MG TABS tablet, Take 15 mg by mouth daily., Disp: , Rfl:   Past Medical History: Past Medical History:  Diagnosis Date   A-fib ([place])    failed amio due to Gi side effects (in [place])    CAD (coronary artery disease)    non-obstructive CAD on cath January 2012. mLAD 40-50%   Chest pain    CHF (congestive heart failure) ([place])    due to recurrent NICM echo 06/2010: EF 20-25% mod-severe MR, moderate TR   Diabetes mellitus    GI bleed    Hyperlipidemia    Hypertension    S/P atrioventricular nodal ablation     Tobacco Use: Social History   Tobacco Use  Smoking Status Never  Smokeless Tobacco  Never    Labs: Recent Review Flowsheet Data     Labs for ITP Cardiac and Pulmonary Rehab Latest Ref Rng & Units 09/11/2013 10/18/2016 06/19/2019 08/14/2019 03/08/2021   LDLCALC - - - 104 - 45   Hemoglobin A1c - 7.4(H) 6.7(H) 6.7 7.7 6.8   PHART 7.350 - 7.400 - - - - -   PCO2ART 35.0 - 45.0 mmHg - - - - -   HCO3 20.0 - 24.0 mEq/L - - - - -   TCO2 0 - 100 mmol/L - - - - -   ACIDBASEDEF 0.0 - 2.0 mmol/L - - - - -   O2SAT % - - - - -        Exercise Target Goals: Exercise Program Goal: Individual exercise prescription set using results from initial 6 min walk test and THRR while considering  patient's activity barriers and safety.   Exercise Prescription Goal: Initial exercise prescription builds to 30-45 minutes a day of aerobic activity, 2-3 days per week.   Home exercise guidelines will be given to patient during program as part of exercise prescription that the participant will acknowledge.   Education: Aerobic Exercise: - Group verbal and visual presentation on the components of exercise prescription. Introduces F.I.T.T principle from ACSM for exercise prescriptions.  Reviews F.I.T.T. principles of aerobic exercise including progression. Written material given at graduation.   Education: Resistance Exercise: - Group verbal and visual presentation on the components of exercise prescription. Introduces F.I.T.T principle from ACSM for exercise prescriptions  Reviews F.I.T.T. principles of resistance exercise including progression. Written material given at graduation.    Education: Exercise & Equipment Safety: - Individual verbal instruction and demonstration of equipment use and safety with use of the equipment. Flowsheet Row Cardiac Rehab from 05/06/2021 in [facility] Cardiac and Pulmonary Rehab  Date 05/06/21  Educator AS  Instruction Review Code 1- Verbalizes Understanding       Education: Exercise Physiology & General Exercise Guidelines: - Group verbal and written instruction with models to review the exercise physiology of the cardiovascular system and associated critical values. Provides general exercise guidelines with specific guidelines to those with heart or lung disease.    Education: Flexibility, Balance, Mind/Body Relaxation: - Group verbal and visual presentation with interactive activity on the components of exercise prescription. Introduces F.I.T.T principle from ACSM for exercise prescriptions. Reviews F.I.T.T. principles of flexibility and balance exercise training including progression. Also discusses the mind body connection.  Reviews various relaxation techniques to help reduce and manage stress (i.e. Deep breathing, progressive muscle relaxation, and visualization). Balance handout provided to take home. Written material given  at graduation.   Activity Barriers & Risk Stratification:  Activity Barriers & Cardiac Risk Stratification - 04/23/21 1346       Activity Barriers & Cardiac Risk Stratification   Activity Barriers Joint Problems;Other (comment);Balance Concerns    Comments left knee    Cardiac Risk Stratification High             6 Minute Walk:  6 Minute Walk     Row Name 05/06/21 1449         6 Minute Walk   Distance 500 feet     Walk Time 5 minutes     # of Rest Breaks 1     MPH 1.1     METS 0.13     RPE 13     Perceived Dyspnea  1     VO2 Peak 0.47     Symptoms No  Resting HR 83 bpm     Resting BP 98/64     Resting Oxygen Saturation  99 %     Exercise Oxygen Saturation  during 6 min walk 100 %     Max Ex. HR 90 bpm     Max Ex. BP 98/62     2 Minute Post BP 96/58              Oxygen Initial Assessment:   Oxygen Re-Evaluation:   Oxygen Discharge (Final Oxygen Re-Evaluation):   Initial Exercise Prescription:  Initial Exercise Prescription - 05/06/21 1500       Date of Initial Exercise RX and Referring Provider   Date 05/06/21    Referring Provider Neoma Laming      Oxygen   Maintain Oxygen Saturation 88% or higher      Treadmill   MPH 1    Grade 0    Minutes 15    METs 1      NuStep   Level 1    SPM 80    Minutes 15    METs 1      REL-XR   Level 1    Speed 50    Minutes 15    METs 1      Biostep-RELP   Level 1    SPM 50    Minutes 15    METs 1      Track   Laps 5    Minutes 15    METs 1      Prescription Details   Frequency (times per week) 3    Duration Progress to 30 minutes of continuous aerobic without signs/symptoms of physical distress      Intensity   THRR 40-80% of Max Heartrate 96-121    Ratings of Perceived Exertion 11-13    Perceived Dyspnea 0-4      Resistance Training   Training Prescription Yes    Weight 2 lb    Reps 10-15             Perform Capillary Blood Glucose checks as needed.  Exercise  Prescription Changes:   Exercise Prescription Changes     Row Name 05/06/21 1400             Response to Exercise   Blood Pressure (Admit) 98/64       Blood Pressure (Exercise) 98/62       Blood Pressure (Exit) 96/58       Heart Rate (Admit) 83 bpm       Heart Rate (Exercise) 90 bpm       Heart Rate (Exit) 75 bpm       Oxygen Saturation (Admit) 99 %       Oxygen Saturation (Exercise) 100 %       Rating of Perceived Exertion (Exercise) 13       Perceived Dyspnea (Exercise) 1                Exercise Comments:   Exercise Goals and Review:   Exercise Goals     Row Name 05/06/21 1453             Exercise Goals   Increase Physical Activity Yes       Intervention Provide advice, education, support and counseling about physical activity/exercise needs.;Develop an individualized exercise prescription for aerobic and resistive training based on initial evaluation findings, risk stratification, comorbidities and participant's personal goals.       Expected Outcomes Short Term: Attend rehab  on a regular basis to increase amount of physical activity.;Long Term: Add in home exercise to make exercise part of routine and to increase amount of physical activity.;Long Term: Exercising regularly at least 3-5 days a week.       Increase Strength and Stamina Yes       Intervention Provide advice, education, support and counseling about physical activity/exercise needs.;Develop an individualized exercise prescription for aerobic and resistive training based on initial evaluation findings, risk stratification, comorbidities and participant's personal goals.       Expected Outcomes Short Term: Increase workloads from initial exercise prescription for resistance, speed, and METs.;Short Term: Perform resistance training exercises routinely during rehab and add in resistance training at home;Long Term: Improve cardiorespiratory fitness, muscular endurance and strength as measured by increased  METs and functional capacity (6MWT)       Able to understand and use rate of perceived exertion (RPE) scale Yes       Intervention Provide education and explanation on how to use RPE scale       Expected Outcomes Short Term: Able to use RPE daily in rehab to express subjective intensity level;Long Term:  Able to use RPE to guide intensity level when exercising independently       Able to understand and use Dyspnea scale Yes       Intervention Provide education and explanation on how to use Dyspnea scale       Expected Outcomes Short Term: Able to use Dyspnea scale daily in rehab to express subjective sense of shortness of breath during exertion;Long Term: Able to use Dyspnea scale to guide intensity level when exercising independently       Knowledge and understanding of Target Heart Rate Range (THRR) Yes       Intervention Provide education and explanation of THRR including how the numbers were predicted and where they are located for reference       Expected Outcomes Short Term: Able to state/look up THRR;Short Term: Able to use daily as guideline for intensity in rehab;Long Term: Able to use THRR to govern intensity when exercising independently       Able to check pulse independently Yes       Intervention Provide education and demonstration on how to check pulse in carotid and radial arteries.;Review the importance of being able to check your own pulse for safety during independent exercise       Expected Outcomes Short Term: Able to explain why pulse checking is important during independent exercise;Long Term: Able to check pulse independently and accurately       Understanding of Exercise Prescription Yes       Intervention Provide education, explanation, and written materials on patient's individual exercise prescription       Expected Outcomes Short Term: Able to explain program exercise prescription;Long Term: Able to explain home exercise prescription to exercise independently                 Exercise Goals Re-Evaluation :   Discharge Exercise Prescription (Final Exercise Prescription Changes):  Exercise Prescription Changes - 05/06/21 1400       Response to Exercise   Blood Pressure (Admit) 98/64    Blood Pressure (Exercise) 98/62    Blood Pressure (Exit) 96/58    Heart Rate (Admit) 83 bpm    Heart Rate (Exercise) 90 bpm    Heart Rate (Exit) 75 bpm    Oxygen Saturation (Admit) 99 %    Oxygen Saturation (Exercise) 100 %  Rating of Perceived Exertion (Exercise) 13    Perceived Dyspnea (Exercise) 1             Nutrition:  Target Goals: Understanding of nutrition guidelines, daily intake of sodium 1500mg , cholesterol 200mg , calories 30% from fat and 7% or less from saturated fats, daily to have 5 or more servings of fruits and vegetables.  Education: All About Nutrition: -Group instruction provided by verbal, written material, interactive activities, discussions, models, and posters to present general guidelines for heart healthy nutrition including fat, fiber, MyPlate, the role of sodium in heart healthy nutrition, utilization of the nutrition label, and utilization of this knowledge for meal planning. Follow up email sent as well. Written material given at graduation.   Biometrics:  Pre Biometrics - 05/06/21 1454       Pre Biometrics   Height 5\' 1"  (1.549 m)    Weight 146 lb (66.2 kg)    BMI (Calculated) 27.6              Nutrition Therapy Plan and Nutrition Goals:   Nutrition Assessments:  MEDIFICTS Score Key: ?70 Need to make dietary changes  40-70 Heart Healthy Diet ? 40 Therapeutic Level Cholesterol Diet   Picture Your Plate Scores: <81 Unhealthy dietary pattern with much room for improvement. 41-50 Dietary pattern unlikely to meet recommendations for good health and room for improvement. 51-60 More healthful dietary pattern, with some room for improvement.  >60 Healthy dietary pattern, although there may be some specific  behaviors that could be improved.    Nutrition Goals Re-Evaluation:   Nutrition Goals Discharge (Final Nutrition Goals Re-Evaluation):   Psychosocial: Target Goals: Acknowledge presence or absence of significant depression and/or stress, maximize coping skills, provide positive support system. Participant is able to verbalize types and ability to use techniques and skills needed for reducing stress and depression.   Education: Stress, Anxiety, and Depression - Group verbal and visual presentation to define topics covered.  Reviews how body is impacted by stress, anxiety, and depression.  Also discusses healthy ways to reduce stress and to treat/manage anxiety and depression.  Written material given at graduation.   Education: Sleep Hygiene -Provides group verbal and written instruction about how sleep can affect your health.  Define sleep hygiene, discuss sleep cycles and impact of sleep habits. Review good sleep hygiene tips.    Initial Review & Psychosocial Screening:  Initial Psych Review & Screening - 04/23/21 1401       Initial Review   Current issues with Current Sleep Concerns;Current Stress Concerns    Source of Stress Concerns Chronic Illness    Comments Returning home to [place] soon      [place]? Yes   husband, 3 sons and 1 daughter plus their families     Barriers   Psychosocial barriers to participate in program The patient should benefit from training in stress management and relaxation.      Screening Interventions   Interventions Encouraged to exercise;Provide feedback about the scores to participant;To provide support and resources with identified psychosocial needs    Expected Outcomes Short Term goal: Utilizing psychosocial counselor, staff and physician to assist with identification of specific Stressors or current issues interfering with healing process. Setting desired goal for each stressor or current issue identified.;Long  Term Goal: Stressors or current issues are controlled or eliminated.;Short Term goal: Identification and review with participant of any Quality of Life or Depression concerns found by scoring the questionnaire.;Long Term goal: The  participant improves quality of Life and PHQ9 Scores as seen by post scores and/or verbalization of changes             Quality of Life Scores:   Quality of Life - 04/23/21 1537       Quality of Life   Select Quality of Life      Quality of Life Scores   Health/Function Pre 18.54 %    Socioeconomic Pre 19.33 %    Psych/Spiritual Pre 24.83 %    Family Pre 30 %    GLOBAL Pre 21.76 %            Scores of 19 and below usually indicate a poorer quality of life in these areas.  A difference of  2-3 points is a clinically meaningful difference.  A difference of 2-3 points in the total score of the Quality of Life Index has been associated with significant improvement in overall quality of life, self-image, physical symptoms, and general health in studies assessing change in quality of life.  PHQ-9: Recent Review Flowsheet Data     Depression screen [facility] 2/9 04/23/2021   Decreased Interest 2   Down, Depressed, Hopeless 0   PHQ - 2 Score 2   Altered sleeping 0   Tired, decreased energy 2   Change in appetite 0   Feeling bad or failure about yourself  0   Trouble concentrating 0   Moving slowly or fidgety/restless 0   Suicidal thoughts 0   PHQ-9 Score 4   Difficult doing work/chores Not difficult at all      Interpretation of Total Score  Total Score Depression Severity:  1-4 = Minimal depression, 5-9 = Mild depression, 10-14 = Moderate depression, 15-19 = Moderately severe depression, 20-27 = Severe depression   Psychosocial Evaluation and Intervention:  Psychosocial Evaluation - 04/23/21 1530       Psychosocial Evaluation & Interventions   Interventions Encouraged to exercise with the program and follow exercise prescription;Stress management  education    Comments Ms. Dina is coming to cardiac rehab for systolic heart failure. Her husband completed the program a while back and is encouraging her to participate, so are her 3 sons and 1 daughter who are physicians. She has been feeling weak, especially after her ICD placement a few weeks ago. The discomfort post ICD is causing current sleep concerns whenever she moves or rolls over. They were here visiting their children since August with a hope of returning home to [place] the end of November which now will be pushed back so she can participate in the program. Her biggest stressor is her lack of energy and stamina which she hopes to work on some while here. She mentioned being tired of all the medication she is on, but is willing to try whatever it takes to feel better.    Expected Outcomes Short: attend Cardiac Rehab for education and exercise. Long: develop and maintain positive self care habits.    Continue Psychosocial Services  Follow up required by staff             Psychosocial Re-Evaluation:   Psychosocial Discharge (Final Psychosocial Re-Evaluation):   Vocational Rehabilitation: Provide vocational rehab assistance to qualifying candidates.   Vocational Rehab Evaluation & Intervention:  Vocational Rehab - 04/23/21 1410       Initial Vocational Rehab Evaluation & Intervention   Assessment shows need for Vocational Rehabilitation No             Education: Education Goals:  Education classes will be provided on a variety of topics geared toward better understanding of heart health and risk factor modification. Participant will state understanding/return demonstration of topics presented as noted by education test scores.  Learning Barriers/Preferences:  Learning Barriers/Preferences - 04/23/21 1410       Learning Barriers/Preferences   Learning Barriers Language   understands some English, Husband to accompany her   Learning Preferences Individual Instruction              General Cardiac Education Topics:  AED/CPR: - Group verbal and written instruction with the use of models to demonstrate the basic use of the AED with the basic ABC's of resuscitation.   Anatomy and Cardiac Procedures: - Group verbal and visual presentation and models provide information about basic cardiac anatomy and function. Reviews the testing methods done to diagnose heart disease and the outcomes of the test results. Describes the treatment choices: Medical Management, Angioplasty, or Coronary Bypass Surgery for treating various heart conditions including Myocardial Infarction, Angina, Valve Disease, and Cardiac Arrhythmias.  Written material given at graduation.   Medication Safety: - Group verbal and visual instruction to review commonly prescribed medications for heart and lung disease. Reviews the medication, class of the drug, and side effects. Includes the steps to properly store meds and maintain the prescription regimen.  Written material given at graduation.   Intimacy: - Group verbal instruction through game format to discuss how heart and lung disease can affect sexual intimacy. Written material given at graduation..   Know Your Numbers and Heart Failure: - Group verbal and visual instruction to discuss disease risk factors for cardiac and pulmonary disease and treatment options.  Reviews associated critical values for Overweight/Obesity, Hypertension, Cholesterol, and Diabetes.  Discusses basics of heart failure: signs/symptoms and treatments.  Introduces Heart Failure Zone chart for action plan for heart failure.  Written material given at graduation.   Infection Prevention: - Provides verbal and written material to individual with discussion of infection control including proper hand washing and proper equipment cleaning during exercise session. Flowsheet Row Cardiac Rehab from 05/06/2021 in [facility] Cardiac and Pulmonary Rehab  Date 05/06/21  Educator  AS  Instruction Review Code 1- Verbalizes Understanding       Falls Prevention: - Provides verbal and written material to individual with discussion of falls prevention and safety. Flowsheet Row Cardiac Rehab from 05/06/2021 in [facility] Cardiac and Pulmonary Rehab  Date 05/06/21  Educator AS  Instruction Review Code 1- Verbalizes Understanding       Other: -Provides group and verbal instruction on various topics (see comments)   Knowledge Questionnaire Score:   Core Components/Risk Factors/Patient Goals at Admission:  Personal Goals and Risk Factors at Admission - 05/06/21 1454       Core Components/Risk Factors/Patient Goals on Admission    Weight Management Yes    Intervention Weight Management: Develop a combined nutrition and exercise program designed to reach desired caloric intake, while maintaining appropriate intake of nutrient and fiber, sodium and fats, and appropriate energy expenditure required for the weight goal.;Weight Management: Provide education and appropriate resources to help participant work on and attain dietary goals.    Admit Weight 146 lb (66.2 kg)    Goal Weight: Short Term 146 lb (66.2 kg)    Goal Weight: Long Term 146 lb (66.2 kg)    Expected Outcomes Short Term: Continue to assess and modify interventions until short term weight is achieved;Long Term: Adherence to nutrition and physical activity/exercise program aimed toward attainment  of established weight goal;Weight Maintenance: Understanding of the daily nutrition guidelines, which includes 25-35% calories from fat, 7% or less cal from saturated fats, less than 200mg  cholesterol, less than 1.5gm of sodium, & 5 or more servings of fruits and vegetables daily;Understanding recommendations for meals to include 15-35% energy as protein, 25-35% energy from fat, 35-60% energy from carbohydrates, less than 200mg  of dietary cholesterol, 20-35 gm of total fiber daily;Understanding of distribution of calorie  intake throughout the day with the consumption of 4-5 meals/snacks    Heart Failure Yes    Intervention Provide a combined exercise and nutrition program that is supplemented with education, support and counseling about heart failure. Directed toward relieving symptoms such as shortness of breath, decreased exercise tolerance, and extremity edema.    Expected Outcomes Improve functional capacity of life;Short term: Attendance in program 2-3 days a week with increased exercise capacity. Reported lower sodium intake. Reported increased fruit and vegetable intake. Reports medication compliance.;Short term: Daily weights obtained and reported for increase. Utilizing diuretic protocols set by physician.;Long term: Adoption of self-care skills and reduction of barriers for early signs and symptoms recognition and intervention leading to self-care maintenance.    Hypertension Yes    Intervention Provide education on lifestyle modifcations including regular physical activity/exercise, weight management, moderate sodium restriction and increased consumption of fresh fruit, vegetables, and low fat dairy, alcohol moderation, and smoking cessation.;Monitor prescription use compliance.    Expected Outcomes Short Term: Continued assessment and intervention until BP is < 140/42mm HG in hypertensive participants. < 130/63mm HG in hypertensive participants with diabetes, heart failure or chronic kidney disease.;Long Term: Maintenance of blood pressure at goal levels.    Lipids Yes    Intervention Provide education and support for participant on nutrition & aerobic/resistive exercise along with prescribed medications to achieve LDL 70mg , HDL >40mg .    Expected Outcomes Short Term: Participant states understanding of desired cholesterol values and is compliant with medications prescribed. Participant is following exercise prescription and nutrition guidelines.;Long Term: Cholesterol controlled with medications as prescribed,  with individualized exercise RX and with personalized nutrition plan. Value goals: LDL < 70mg , HDL > 40 mg.             Education:Diabetes - Individual verbal and written instruction to review signs/symptoms of diabetes, desired ranges of glucose level fasting, after meals and with exercise. Acknowledge that pre and post exercise glucose checks will be done for 3 sessions at entry of program. [place] from 04/23/2021 in [facility] Cardiac [facility] Pul[facility] Rehab  Date 04/23/21  Educator [facility]  Instruction Review Code 1- Verbalizes Understanding       Core Components/Risk Factors/Patient Goals Review:    Core Components/Risk Factors/Patient Goals at Discharge (Final Review):    ITP Comments:  ITP Comments     Row Name 04/23/21 1506           ITP Comments Initial orientation completed. Diagnosis can be found in Media Tab- Dr. Laurelyn Sickle office note 11/1. EP orientation scheduled for Thursday 11/17 at 2pm.                Comments: initial ITP

## 2021-05-10 ENCOUNTER — Other Ambulatory Visit: Payer: Self-pay

## 2021-05-10 DIAGNOSIS — I5022 Chronic systolic (congestive) heart failure: Secondary | ICD-10-CM

## 2021-05-10 LAB — GLUCOSE, CAPILLARY
Glucose-Capillary: 90 mg/dL (ref 70–99)
Glucose-Capillary: 123 mg/dL — ABNORMAL HIGH (ref 70–99)

## 2021-05-10 NOTE — Progress Notes (Signed)
Daily Session Note  Patient Details  Name: April Park MRN: [id number] Date of Birth: [birth year] Referring Provider:   Flowsheet Row Cardiac Rehab from 05/06/2021 in [facility] Cardiac and Pulmonary Rehab  Referring Provider Elvi, [place]       Encounter Date: 05/10/2021  Check In:  Session Check In - 05/10/21 1604       Check-In   Supervising physician immediately available to respond to emergencies See telemetry face sheet for immediately available ER MD    Location [facility]-Cardiac & Pulmonary Rehab    Staff Present Birdie Sons, MPA, RN;Joseph Lou Miner, MS, ASCM CEP, Exercise Physiologist    Virtual Visit No    Medication changes reported     No    Fall or balance concerns reported    No    Warm-up and Cool-down Performed on first and last piece of equipment    Resistance Training Performed Yes    VAD Patient? No    PAD/SET Patient? No      Pain Assessment   Currently in Pain? No/denies                Social History   Tobacco Use  Smoking Status Never  Smokeless Tobacco Never    Goals Met:  Independence with exercise equipment Exercise tolerated well No report of concerns or symptoms today Strength training completed today  Goals Unmet:  Not Applicable  Comments: First full day of exercise!  Patient was oriented to gym and equipment including functions, settings, policies, and procedures.  Patient's individual exercise prescription and treatment plan were reviewed.  All starting workloads were established based on the results of the 6 minute walk test done at initial orientation visit.  The plan for exercise progression was also introduced and progression will be customized based on patient's performance and goals.    Dr. Emily Filbert is Medical Director for [place].  Dr. Ottie Glazier is Medical Director for [facility] Rehabilitation.

## 2021-05-17 ENCOUNTER — Other Ambulatory Visit: Payer: Self-pay

## 2021-05-17 ENCOUNTER — Encounter: Payer: Medicare Other | Admitting: *Deleted

## 2021-05-17 DIAGNOSIS — I5022 Chronic systolic (congestive) heart failure: Secondary | ICD-10-CM

## 2021-05-17 LAB — GLUCOSE, CAPILLARY
Glucose-Capillary: 94 mg/dL (ref 70–99)
Glucose-Capillary: 88 mg/dL (ref 70–99)

## 2021-05-17 NOTE — Progress Notes (Signed)
Daily Session Note  Patient Details  Name: April Park MRN: [id number] Date of Birth: 04/05/1932 Referring Provider:   Flowsheet Row Cardiac Rehab from 05/06/2021 in [facility] Cardiac and Pulmonary Re[facility]  Referring Provider Szeto, Shaukat       Encounter Date: 05/17/2021  Check In:  Session Check In - 05/17/21 1538       Check-In   Supervising physician immediately available to respond to emergencies See telemetry face sheet for immediately available ER MD    Location [facility] & Pul[facility]    Staff Present Meredith Craven, RN BSN;Joseph Hood, RCP,RRT,BSRT;Kara Langdon, MS, ASCM CEP, Exercise Physiologist    Virtual Visit No    Medication changes reported     No    Fall or balance concerns reported    No    Warm-up and Cool-down Performed on first and last piece of equipment    Resistance Training Performed Yes    VAD Patient? No    PAD/SET Patient? No      Pain Assessment   Currently in Pain? No/denies                Social History   Tobacco Use  Smoking Status Never  Smokeless Tobacco Never    Goals Met:  Independence with exercise equipment Exercise tolerated well No report of concerns or symptoms today Strength training completed today  Goals Unmet:  Not Applicable  Comments: Pt able to follow exercise prescription today without complaint.  Will continue to monitor for progression.    Dr. Mark Miller is Medical Director for [facility].  Dr. Fuad Aleskerov is Medical Director for [facility]. 

## 2021-05-18 ENCOUNTER — Other Ambulatory Visit: Payer: Self-pay | Admitting: Internal Medicine

## 2021-05-18 DIAGNOSIS — R1013 Epigastric pain: Secondary | ICD-10-CM

## 2021-05-18 DIAGNOSIS — K219 Gastro-esophageal reflux disease without esophagitis: Secondary | ICD-10-CM

## 2021-05-19 ENCOUNTER — Encounter: Payer: Self-pay | Admitting: *Deleted

## 2021-05-19 ENCOUNTER — Other Ambulatory Visit: Payer: Self-pay

## 2021-05-19 ENCOUNTER — Ambulatory Visit: Admission: RE | Admit: 2021-05-19 | Payer: Medicare Other | Source: Ambulatory Visit | Admitting: Internal Medicine

## 2021-05-19 DIAGNOSIS — I5022 Chronic systolic (congestive) heart failure: Secondary | ICD-10-CM | POA: Diagnosis not present

## 2021-05-19 LAB — GLUCOSE, CAPILLARY
Glucose-Capillary: 107 mg/dL — ABNORMAL HIGH (ref 70–99)
Glucose-Capillary: 107 mg/dL — ABNORMAL HIGH (ref 70–99)

## 2021-05-19 NOTE — Progress Notes (Signed)
Daily Session Note  Patient Details  Name: April Park MRN: [id number] Date of Birth: 10-27-1931 Referring Provider:   Flowsheet Row Cardiac Rehab from 05/06/2021 in [facility] Cardiac [facility] Re[facility]  Referring Provider Acsa, Estey       Encounter Date: 05/19/2021  Check In:  Session Check In - 05/19/21 1528       Check-In   Supervising physician immediately available to respond to emergencies See telemetry face sheet for immediately available ER MD    Location [facility]-Cardiac & Pulmonary Rehab    Staff Present Birdie Sons, MPA, Nino Glow, MS, ASCM CEP, Exercise Physiologist;Joseph Tessie Fass, [place]    Virtual Visit No    Medication changes reported     No    Fall or balance concerns reported    No    Warm-up and Cool-down Performed on first and last piece of equipment    Resistance Training Performed Yes    VAD Patient? No    PAD/SET Patient? No      Pain Assessment   Currently in Pain? No/denies                Social History   Tobacco Use  Smoking Status Never  Smokeless Tobacco Never    Goals Met:  Independence with exercise equipment Exercise tolerated well No report of concerns or symptoms today Strength training completed today  Goals Unmet:  Not Applicable  Comments: Pt able to follow exercise prescription today without complaint.  Will continue to monitor for progression.    Dr. Emily Filbert is Medical Director for [place].  Dr. Ottie Glazier is Medical Director for [facility] Pulmonary Rehabilitation.

## 2021-05-19 NOTE — Progress Notes (Signed)
Cardiac Individual Treatment Plan  Patient Details  Name: April Park MRN: [id number] Date of Birth: [birth year] Referring Provider:   Flowsheet Row Cardiac Rehab from 05/06/2021 in [facility] Cardiac and Pulmonary Rehab  Referring Provider Neoma Laming       Initial Encounter Date:  Flowsheet Row Cardiac Rehab from 05/06/2021 in [facility] Cardiac and Pulmonary Rehab  Date 05/06/21       Visit Diagnosis: Heart failure, chronic systolic ([place])  Patient's Home Medications on Admission:  Current Outpatient Medications:    amiodarone (PACERONE) 200 MG tablet, Take 1 tablet by mouth daily., Disp: , Rfl:    calcium-vitamin D 250-100 MG-UNIT tablet, Take 1 tablet by mouth daily., Disp: , Rfl:    carvedilol (COREG) 12.5 MG tablet, Take 12.5 mg by mouth 2 (two) times daily., Disp: , Rfl:    dapagliflozin propanediol (FARXIGA) 5 MG TABS tablet, Take 5 mg by mouth daily., Disp: 90 tablet, Rfl: 1   furosemide (LASIX) 20 MG tablet, Take 20 mg by mouth as needed., Disp: , Rfl:    glimepiride (AMARYL) 2 MG tablet, Take 1 tablet (2 mg total) by mouth See admin instructions. Take 2mg  in the morning and 1mg  in the evening (Patient taking differently: Take 1 mg by mouth See admin instructions. Take 1mg  in the morning and 1mg  in the evening), Disp: 180 tablet, Rfl: 1   glucose blood test strip, Use OneTouch Verio test strips as instructed to check blood sugar twice daily., Disp: 200 each, Rfl: 2   Magnesium 500 MG CAPS, Take 1 capsule by mouth daily., Disp: , Rfl:    metFORMIN (GLUCOPHAGE) 500 MG tablet, Take 500 mg by mouth 2 (two) times daily., Disp: , Rfl:    OneTouch Delica Lancets 00T MISC, 1 each by Does not apply route See admin instructions. Use OneTouch Delica lancets to check blood sugar 2-3 times daily., Disp: 100 each, Rfl: 3   pantoprazole (PROTONIX) 40 MG tablet, Take 40 mg by mouth 2 (two) times daily., Disp: , Rfl:    polyethylene glycol (MIRALAX / GLYCOLAX) 17 g packet, Take 17 g by mouth daily.,  Disp: , Rfl:    rosuvastatin (CRESTOR) 10 MG tablet, Take 10 mg by mouth daily., Disp: , Rfl:    sacubitril-valsartan (ENTRESTO) 49-51 MG, Take 1 tablet by mouth 2 (two) times daily., Disp: , Rfl:    sitaGLIPtin (JANUVIA) 100 MG tablet, Take 1 tablet (100 mg total) by mouth daily. (Patient taking differently: Take 50 mg by mouth daily.), Disp: 90 tablet, Rfl: 2   SLOW FE 142 (45 Fe) MG TBCR, Take 1 tablet by mouth daily., Disp: , Rfl:    spironolactone (ALDACTONE) 25 MG tablet, Take 25 mg by mouth daily., Disp: , Rfl:    XARELTO 15 MG TABS tablet, Take 15 mg by mouth daily., Disp: , Rfl:   Past Medical History: Past Medical History:  Diagnosis Date   A-fib ([place])    failed amio due to Gi side effects (in [place])    CAD (coronary artery disease)    non-obstructive CAD on cath January 2012. mLAD 40-50%   Chest pain    CHF (congestive heart failure) ([place])    due to recurrent NICM echo 06/2010: EF 20-25% mod-severe MR, moderate TR   Diabetes mellitus    GI bleed    Hyperlipidemia    Hypertension    S/P atrioventricular nodal ablation     Tobacco Use: Social History   Tobacco Use  Smoking Status Never  Smokeless Tobacco  Never    Labs: Recent Review Flowsheet Data     Labs for ITP Cardiac and Pulmonary Rehab Latest Ref Rng & Units 09/11/2013 10/18/2016 06/19/2019 08/14/2019 03/08/2021   LDLCALC - - - 104 - 45   Hemoglobin A1c - 7.4(H) 6.7(H) 6.7 7.7 6.8   PHART 7.350 - 7.400 - - - - -   PCO2ART 35.0 - 45.0 mmHg - - - - -   HCO3 20.0 - 24.0 mEq/L - - - - -   TCO2 0 - 100 mmol/L - - - - -   ACIDBASEDEF 0.0 - 2.0 mmol/L - - - - -   O2SAT % - - - - -        Exercise Target Goals: Exercise Program Goal: Individual exercise prescription set using results from initial 6 min walk test and THRR while considering  patient's activity barriers and safety.   Exercise Prescription Goal: Initial exercise prescription builds to 30-45 minutes a day of aerobic activity, 2-3 days per week.   Home exercise guidelines will be given to patient during program as part of exercise prescription that the participant will acknowledge.   Education: Aerobic Exercise: - Group verbal and visual presentation on the components of exercise prescription. Introduces F.I.T.T principle from ACSM for exercise prescriptions.  Reviews F.I.T.T. principles of aerobic exercise including progression. Written material given at graduation.   Education: Resistance Exercise: - Group verbal and visual presentation on the components of exercise prescription. Introduces F.I.T.T principle from ACSM for exercise prescriptions  Reviews F.I.T.T. principles of resistance exercise including progression. Written material given at graduation.    Education: Exercise & Equipment Safety: - Individual verbal instruction and demonstration of equipment use and safety with use of the equipment. Flowsheet Row Cardiac Rehab from 05/06/2021 in [facility] Cardiac and Pulmonary Rehab  Date 05/06/21  Educator AS  Instruction Review Code 1- Verbalizes Understanding       Education: Exercise Physiology & General Exercise Guidelines: - Group verbal and written instruction with models to review the exercise physiology of the cardiovascular system and associated critical values. Provides general exercise guidelines with specific guidelines to those with heart or lung disease.    Education: Flexibility, Balance, Mind/Body Relaxation: - Group verbal and visual presentation with interactive activity on the components of exercise prescription. Introduces F.I.T.T principle from ACSM for exercise prescriptions. Reviews F.I.T.T. principles of flexibility and balance exercise training including progression. Also discusses the mind body connection.  Reviews various relaxation techniques to help reduce and manage stress (i.e. Deep breathing, progressive muscle relaxation, and visualization). Balance handout provided to take home. Written material given  at graduation.   Activity Barriers & Risk Stratification:  Activity Barriers & Cardiac Risk Stratification - 04/23/21 1346       Activity Barriers & Cardiac Risk Stratification   Activity Barriers Joint Problems;Other (comment);Balance Concerns    Comments left knee    Cardiac Risk Stratification High             6 Minute Walk:  6 Minute Walk     Row Name 05/06/21 1449         6 Minute Walk   Distance 500 feet     Walk Time 5 minutes     # of Rest Breaks 1     MPH 1.1     METS 0.13     RPE 13     Perceived Dyspnea  1     VO2 Peak 0.47     Symptoms No  Resting HR 83 bpm     Resting BP 98/64     Resting Oxygen Saturation  99 %     Exercise Oxygen Saturation  during 6 min walk 100 %     Max Ex. HR 90 bpm     Max Ex. BP 98/62     2 Minute Post BP 96/58              Oxygen Initial Assessment:   Oxygen Re-Evaluation:   Oxygen Discharge (Final Oxygen Re-Evaluation):   Initial Exercise Prescription:  Initial Exercise Prescription - 05/06/21 1500       Date of Initial Exercise RX and Referring Provider   Date 05/06/21    Referring Provider Neoma Laming      Oxygen   Maintain Oxygen Saturation 88% or higher      Treadmill   MPH 1    Grade 0    Minutes 15    METs 1      NuStep   Level 1    SPM 80    Minutes 15    METs 1      REL-XR   Level 1    Speed 50    Minutes 15    METs 1      Biostep-RELP   Level 1    SPM 50    Minutes 15    METs 1      Track   Laps 5    Minutes 15    METs 1      Prescription Details   Frequency (times per week) 3    Duration Progress to 30 minutes of continuous aerobic without signs/symptoms of physical distress      Intensity   THRR 40-80% of Max Heartrate 96-121    Ratings of Perceived Exertion 11-13    Perceived Dyspnea 0-4      Resistance Training   Training Prescription Yes    Weight 2 lb    Reps 10-15             Perform Capillary Blood Glucose checks as needed.  Exercise  Prescription Changes:   Exercise Prescription Changes     Row Name 05/06/21 1400 05/12/21 1500           Response to Exercise   Blood Pressure (Admit) 98/64 102/64      Blood Pressure (Exercise) 98/62 110/60      Blood Pressure (Exit) 96/58 100/58      Heart Rate (Admit) 83 bpm 70 bpm      Heart Rate (Exercise) 90 bpm 84 bpm      Heart Rate (Exit) 75 bpm 80 bpm      Oxygen Saturation (Admit) 99 % --      Oxygen Saturation (Exercise) 100 % --      Rating of Perceived Exertion (Exercise) 13 13      Perceived Dyspnea (Exercise) 1 --      Symptoms -- none      Comments -- first day      Duration -- Progress to 30 minutes of  aerobic without signs/symptoms of physical distress      Intensity -- THRR unchanged        Progression   Progression -- Continue to progress workloads to maintain intensity without signs/symptoms of physical distress.      Average METs -- 2        Resistance Training   Training Prescription -- Yes      Weight -- 2  lb      Reps -- 10-15        REL-XR   Level -- 1      Minutes -- 15        Track   Laps -- 5      Minutes -- 15               Exercise Comments:   Exercise Comments     Row Name 05/10/21 1605           Exercise Comments First full day of exercise!  Patient was oriented to gym and equipment including functions, settings, policies, and procedures.  Patient's individual exercise prescription and treatment plan were reviewed.  All starting workloads were established based on the results of the 6 minute walk test done at initial orientation visit.  The plan for exercise progression was also introduced and progression will be customized based on patient's performance and goals.                Exercise Goals and Review:   Exercise Goals     Row Name 05/06/21 1453             Exercise Goals   Increase Physical Activity Yes       Intervention Provide advice, education, support and counseling about physical  activity/exercise needs.;Develop an individualized exercise prescription for aerobic and resistive training based on initial evaluation findings, risk stratification, comorbidities and participant's personal goals.       Expected Outcomes Short Term: Attend rehab on a regular basis to increase amount of physical activity.;Long Term: Add in home exercise to make exercise part of routine and to increase amount of physical activity.;Long Term: Exercising regularly at least 3-5 days a week.       Increase Strength and Stamina Yes       Intervention Provide advice, education, support and counseling about physical activity/exercise needs.;Develop an individualized exercise prescription for aerobic and resistive training based on initial evaluation findings, risk stratification, comorbidities and participant's personal goals.       Expected Outcomes Short Term: Increase workloads from initial exercise prescription for resistance, speed, and METs.;Short Term: Perform resistance training exercises routinely during rehab and add in resistance training at home;Long Term: Improve cardiorespiratory fitness, muscular endurance and strength as measured by increased METs and functional capacity (6MWT)       Able to understand and use rate of perceived exertion (RPE) scale Yes       Intervention Provide education and explanation on how to use RPE scale       Expected Outcomes Short Term: Able to use RPE daily in rehab to express subjective intensity level;Long Term:  Able to use RPE to guide intensity level when exercising independently       Able to understand and use Dyspnea scale Yes       Intervention Provide education and explanation on how to use Dyspnea scale       Expected Outcomes Short Term: Able to use Dyspnea scale daily in rehab to express subjective sense of shortness of breath during exertion;Long Term: Able to use Dyspnea scale to guide intensity level when exercising independently       Knowledge and  understanding of Target Heart Rate Range (THRR) Yes       Intervention Provide education and explanation of THRR including how the numbers were predicted and where they are located for reference       Expected Outcomes Short Term: Able to state/look  up THRR;Short Term: Able to use daily as guideline for intensity in rehab;Long Term: Able to use THRR to govern intensity when exercising independently       Able to check pulse independently Yes       Intervention Provide education and demonstration on how to check pulse in carotid and radial arteries.;Review the importance of being able to check your own pulse for safety during independent exercise       Expected Outcomes Short Term: Able to explain why pulse checking is important during independent exercise;Long Term: Able to check pulse independently and accurately       Understanding of Exercise Prescription Yes       Intervention Provide education, explanation, and written materials on patient's individual exercise prescription       Expected Outcomes Short Term: Able to explain program exercise prescription;Long Term: Able to explain home exercise prescription to exercise independently                Exercise Goals Re-Evaluation :  Exercise Goals Re-Evaluation     [place] Name 05/10/21 1605             Exercise Goal Re-Evaluation   Exercise Goals Review Increase Physical Activity;Able to understand and use rate of perceived exertion (RPE) scale;Knowledge and understanding of Target Heart Rate Range (THRR);Understanding of Exercise Prescription;Increase Strength and Stamina;Able to understand and use Dyspnea scale;Able to check pulse independently       Comments Reviewed RPE and dyspnea scales, THR and program prescription with pt today.  Pt voiced understanding and was given a copy of goals to take home.       Expected Outcomes Short: Use RPE daily to regulate intensity. Long: Follow program prescription in THR.                 Discharge Exercise Prescription (Final Exercise Prescription Changes):  Exercise Prescription Changes - 05/12/21 1500       Response to Exercise   Blood Pressure (Admit) 102/64    Blood Pressure (Exercise) 110/60    Blood Pressure (Exit) 100/58    Heart Rate (Admit) 70 bpm    Heart Rate (Exercise) 84 bpm    Heart Rate (Exit) 80 bpm    Rating of Perceived Exertion (Exercise) 13    Symptoms none    Comments first day    Duration Progress to 30 minutes of  aerobic without signs/symptoms of physical distress    Intensity THRR unchanged      Progression   Progression Continue to progress workloads to maintain intensity without signs/symptoms of physical distress.    Average METs 2      Resistance Training   Training Prescription Yes    Weight 2 lb    Reps 10-15      REL-XR   Level 1    Minutes 15      Track   Laps 5    Minutes 15             Nutrition:  Target Goals: Understanding of nutrition guidelines, daily intake of sodium 1500mg , cholesterol 200mg , calories 30% from fat and 7% or less from saturated fats, daily to have 5 or more servings of fruits and vegetables.  Education: All About Nutrition: -Group instruction provided by verbal, written material, interactive activities, discussions, models, and posters to present general guidelines for heart healthy nutrition including fat, fiber, MyPlate, the role of sodium in heart healthy nutrition, utilization of the nutrition label, and utilization of this knowledge  for meal planning. Follow up email sent as well. Written material given at graduation.   Biometrics:  Pre Biometrics - 05/06/21 1454       Pre Biometrics   Height 5\' 1"  (1.549 m)    Weight 146 lb (66.2 kg)    BMI (Calculated) 27.6              Nutrition Therapy Plan and Nutrition Goals:   Nutrition Assessments:  MEDIFICTS Score Key: ?70 Need to make dietary changes  40-70 Heart Healthy Diet ? 40 Therapeutic Level Cholesterol  Diet   Picture Your Plate Scores: <62 Unhealthy dietary pattern with much room for improvement. 41-50 Dietary pattern unlikely to meet recommendations for good health and room for improvement. 51-60 More healthful dietary pattern, with some room for improvement.  >60 Healthy dietary pattern, although there may be some specific behaviors that could be improved.    Nutrition Goals Re-Evaluation:   Nutrition Goals Discharge (Final Nutrition Goals Re-Evaluation):   Psychosocial: Target Goals: Acknowledge presence or absence of significant depression and/or stress, maximize coping skills, provide positive support system. Participant is able to verbalize types and ability to use techniques and skills needed for reducing stress and depression.   Education: Stress, Anxiety, and Depression - Group verbal and visual presentation to define topics covered.  Reviews how body is impacted by stress, anxiety, and depression.  Also discusses healthy ways to reduce stress and to treat/manage anxiety and depression.  Written material given at graduation.   Education: Sleep Hygiene -Provides group verbal and written instruction about how sleep can affect your health.  Define sleep hygiene, discuss sleep cycles and impact of sleep habits. Review good sleep hygiene tips.    Initial Review & Psychosocial Screening:  Initial Psych Review & Screening - 04/23/21 1401       Initial Review   Current issues with Current Sleep Concerns;Current Stress Concerns    Source of Stress Concerns Chronic Illness    Comments Returning home to [place] soon      [place]? Yes   husband, 3 sons and 1 daughter plus their families     Barriers   Psychosocial barriers to participate in program The patient should benefit from training in stress management and relaxation.      Screening Interventions   Interventions Encouraged to exercise;Provide feedback about the scores to participant;To  provide support and resources with identified psychosocial needs    Expected Outcomes Short Term goal: Utilizing psychosocial counselor, staff and physician to assist with identification of specific Stressors or current issues interfering with healing process. Setting desired goal for each stressor or current issue identified.;Long Term Goal: Stressors or current issues are controlled or eliminated.;Short Term goal: Identification and review with participant of any Quality of Life or Depression concerns found by scoring the questionnaire.;Long Term goal: The participant improves quality of Life and PHQ9 Scores as seen by post scores and/or verbalization of changes             Quality of Life Scores:   Quality of Life - 04/23/21 1537       Quality of Life   Select Quality of Life      Quality of Life Scores   Health/Function Pre 18.54 %    Socioeconomic Pre 19.33 %    Psych/Spiritual Pre 24.83 %    Family Pre 30 %    GLOBAL Pre 21.76 %  Scores of 19 and below usually indicate a poorer quality of life in these areas.  A difference of  2-3 points is a clinically meaningful difference.  A difference of 2-3 points in the total score of the Quality of Life Index has been associated with significant improvement in overall quality of life, self-image, physical symptoms, and general health in studies assessing change in quality of life.  PHQ-9: Recent Review Flowsheet Data     Depression screen [facility] 2/9 04/23/2021   Decreased Interest 2   Down, Depressed, Hopeless 0   PHQ - 2 Score 2   Altered sleeping 0   Tired, decreased energy 2   Change in appetite 0   Feeling bad or failure about yourself  0   Trouble concentrating 0   Moving slowly or fidgety/restless 0   Suicidal thoughts 0   PHQ-9 Score 4   Difficult doing work/chores Not difficult at all      Interpretation of Total Score  Total Score Depression Severity:  1-4 = Minimal depression, 5-9 = Mild depression,  10-14 = Moderate depression, 15-19 = Moderately severe depression, 20-27 = Severe depression   Psychosocial Evaluation and Intervention:  Psychosocial Evaluation - 04/23/21 1530       Psychosocial Evaluation & Interventions   Interventions Encouraged to exercise with the program and follow exercise prescription;Stress management education    Comments Ms. Dejarnett is coming to cardiac rehab for systolic heart failure. Her husband completed the program a while back and is encouraging her to participate, so are her 3 sons and 1 daughter who are physicians. She has been feeling weak, especially after her ICD placement a few weeks ago. The discomfort post ICD is causing current sleep concerns whenever she moves or rolls over. They were here visiting their children since August with a hope of returning home to [place] the end of November which now will be pushed back so she can participate in the program. Her biggest stressor is her lack of energy and stamina which she hopes to work on some while here. She mentioned being tired of all the medication she is on, but is willing to try whatever it takes to feel better.    Expected Outcomes Short: attend Cardiac Rehab for education and exercise. Long: develop and maintain positive self care habits.    Continue Psychosocial Services  Follow up required by staff             Psychosocial Re-Evaluation:   Psychosocial Discharge (Final Psychosocial Re-Evaluation):   Vocational Rehabilitation: Provide vocational rehab assistance to qualifying candidates.   Vocational Rehab Evaluation & Intervention:  Vocational Rehab - 04/23/21 1410       Initial Vocational Rehab Evaluation & Intervention   Assessment shows need for Vocational Rehabilitation No             Education: Education Goals: Education classes will be provided on a variety of topics geared toward better understanding of heart health and risk factor modification. Participant will state  understanding/return demonstration of topics presented as noted by education test scores.  Learning Barriers/Preferences:  Learning Barriers/Preferences - 04/23/21 1410       Learning Barriers/Preferences   Learning Barriers Language   understands some English, Husband to accompany her   Learning Preferences Individual Instruction             General Cardiac Education Topics:  AED/CPR: - Group verbal and written instruction with the use of models to demonstrate the basic use of the AED  with the basic ABC's of resuscitation.   Anatomy and Cardiac Procedures: - Group verbal and visual presentation and models provide information about basic cardiac anatomy and function. Reviews the testing methods done to diagnose heart disease and the outcomes of the test results. Describes the treatment choices: Medical Management, Angioplasty, or Coronary Bypass Surgery for treating various heart conditions including Myocardial Infarction, Angina, Valve Disease, and Cardiac Arrhythmias.  Written material given at graduation.   Medication Safety: - Group verbal and visual instruction to review commonly prescribed medications for heart and lung disease. Reviews the medication, class of the drug, and side effects. Includes the steps to properly store meds and maintain the prescription regimen.  Written material given at graduation.   Intimacy: - Group verbal instruction through game format to discuss how heart and lung disease can affect sexual intimacy. Written material given at graduation..   Know Your Numbers and Heart Failure: - Group verbal and visual instruction to discuss disease risk factors for cardiac and pulmonary disease and treatment options.  Reviews associated critical values for Overweight/Obesity, Hypertension, Cholesterol, and Diabetes.  Discusses basics of heart failure: signs/symptoms and treatments.  Introduces Heart Failure Zone chart for action plan for heart failure.  Written  material given at graduation.   Infection Prevention: - Provides verbal and written material to individual with discussion of infection control including proper hand washing and proper equipment cleaning during exercise session. Flowsheet Row Cardiac Rehab from 05/06/2021 in [facility] Cardiac and Pulmonary Rehab  Date 05/06/21  Educator AS  Instruction Review Code 1- Verbalizes Understanding       Falls Prevention: - Provides verbal and written material to individual with discussion of falls prevention and safety. Flowsheet Row Cardiac Rehab from 05/06/2021 in [facility] Cardiac and Pulmonary Rehab  Date 05/06/21  Educator AS  Instruction Review Code 1- Verbalizes Understanding       Other: -Provides group and verbal instruction on various topics (see comments)   Knowledge Questionnaire Score:   Core Components/Risk Factors/Patient Goals at Admission:  Personal Goals and Risk Factors at Admission - 05/06/21 1454       Core Components/Risk Factors/Patient Goals on Admission    Weight Management Yes    Intervention Weight Management: Develop a combined nutrition and exercise program designed to reach desired caloric intake, while maintaining appropriate intake of nutrient and fiber, sodium and fats, and appropriate energy expenditure required for the weight goal.;Weight Management: Provide education and appropriate resources to help participant work on and attain dietary goals.    Admit Weight 146 lb (66.2 kg)    Goal Weight: Short Term 146 lb (66.2 kg)    Goal Weight: Long Term 146 lb (66.2 kg)    Expected Outcomes Short Term: Continue to assess and modify interventions until short term weight is achieved;Long Term: Adherence to nutrition and physical activity/exercise program aimed toward attainment of established weight goal;Weight Maintenance: Understanding of the daily nutrition guidelines, which includes 25-35% calories from fat, 7% or less cal from saturated fats, less than 200mg   cholesterol, less than 1.5gm of sodium, & 5 or more servings of fruits and vegetables daily;Understanding recommendations for meals to include 15-35% energy as protein, 25-35% energy from fat, 35-60% energy from carbohydrates, less than 200mg  of dietary cholesterol, 20-35 gm of total fiber daily;Understanding of distribution of calorie intake throughout the day with the consumption of 4-5 meals/snacks    Heart Failure Yes    Intervention Provide a combined exercise and nutrition program that is supplemented with education, support  and counseling about heart failure. Directed toward relieving symptoms such as shortness of breath, decreased exercise tolerance, and extremity edema.    Expected Outcomes Improve functional capacity of life;Short term: Attendance in program 2-3 days a week with increased exercise capacity. Reported lower sodium intake. Reported increased fruit and vegetable intake. Reports medication compliance.;Short term: Daily weights obtained and reported for increase. Utilizing diuretic protocols set by physician.;Long term: Adoption of self-care skills and reduction of barriers for early signs and symptoms recognition and intervention leading to self-care maintenance.    Hypertension Yes    Intervention Provide education on lifestyle modifcations including regular physical activity/exercise, weight management, moderate sodium restriction and increased consumption of fresh fruit, vegetables, and low fat dairy, alcohol moderation, and smoking cessation.;Monitor prescription use compliance.    Expected Outcomes Short Term: Continued assessment and intervention until BP is < 140/25mm HG in hypertensive participants. < 130/81mm HG in hypertensive participants with diabetes, heart failure or chronic kidney disease.;Long Term: Maintenance of blood pressure at goal levels.    Lipids Yes    Intervention Provide education and support for participant on nutrition & aerobic/resistive exercise along with  prescribed medications to achieve LDL 70mg , HDL >40mg .    Expected Outcomes Short Term: Participant states understanding of desired cholesterol values and is compliant with medications prescribed. Participant is following exercise prescription and nutrition guidelines.;Long Term: Cholesterol controlled with medications as prescribed, with individualized exercise RX and with personalized nutrition plan. Value goals: LDL < 70mg , HDL > 40 mg.             Education:Diabetes - Individual verbal and written instruction to review signs/symptoms of diabetes, desired ranges of glucose level fasting, after meals and with exercise. Acknowledge that pre and post exercise glucose checks will be done for 3 sessions at entry of program. Flowsheet Row Cardiac Rehab from 04/23/2021 in [facility] Cardiac and Pulmonary Rehab  Date 04/23/21  Educator [facility]  Instruction Review Code 1- Verbalizes Understanding       Core Components/Risk Factors/Patient Goals Review:    Core Components/Risk Factors/Patient Goals at Discharge (Final Review):    ITP Comments:  ITP Comments     Row Name 04/23/21 1506 05/06/21 1511 05/10/21 1605 05/19/21 0713     ITP Comments Initial orientation completed. Diagnosis can be found in [facility]- Dr. Laurelyn Sickle office note 11/1. EP orientation scheduled for Thursday 11/17 at 2pm. Completed 6MWT and gym orientation. Initial ITP created and sent for review to Dr. Emily Filbert, Medical Director. First full day of exercise!  Patient was oriented to gym and equipment including functions, settings, policies, and procedures.  Patient's individual exercise prescription and treatment plan were reviewed.  All starting workloads were established based on the results of the 6 minute walk test done at initial orientation visit.  The plan for exercise progression was also introduced and progression will be customized based on patient's performance and goals. 30 Day review completed. Medical Director ITP review  done, changes made as directed, and signed approval by Medical Director.    New to program             Comments:

## 2021-05-20 ENCOUNTER — Other Ambulatory Visit: Payer: Self-pay

## 2021-05-20 ENCOUNTER — Encounter: Payer: Medicare Other | Attending: Cardiovascular Disease | Admitting: *Deleted

## 2021-05-20 ENCOUNTER — Other Ambulatory Visit (HOSPITAL_COMMUNITY): Payer: Self-pay

## 2021-05-20 ENCOUNTER — Telehealth: Payer: Medicare Other | Admitting: Gastroenterology

## 2021-05-20 ENCOUNTER — Telehealth: Payer: Self-pay

## 2021-05-20 DIAGNOSIS — E119 Type 2 diabetes mellitus without complications: Secondary | ICD-10-CM | POA: Diagnosis not present

## 2021-05-20 DIAGNOSIS — I5022 Chronic systolic (congestive) heart failure: Secondary | ICD-10-CM | POA: Diagnosis present

## 2021-05-20 NOTE — Progress Notes (Signed)
Daily Session Note  Patient Details  Name: April Park MRN: [id number] Date of Birth: 12/18/1931 Referring Provider:   Flowsheet Row Cardiac Rehab from 05/06/2021 in [facility] Cardiac and Pulmonary Rehab  Referring Provider Charnette, Younkin       Encounter Date: 05/20/2021  Check In:  Session Check In - 05/20/21 1526       Check-In   Supervising physician immediately available to respond to emergencies See telemetry face sheet for immediately available ER MD    Location [facility]-Cardiac & Pulmonary Rehab    Staff Present Renita Papa, RN BSN;Joseph Miston, RCP,RRT,BSRT;Jessica [place], [place], RCEP, CCRP, CCET    Virtual Visit No    Medication changes reported     No    Fall or balance concerns reported    No    Warm-up and Cool-down Performed on first and last piece of equipment    Resistance Training Performed Yes    VAD Patient? No    PAD/SET Patient? No      Pain Assessment   Currently in Pain? No/denies                Social History   Tobacco Use  Smoking Status Never  Smokeless Tobacco Never    Goals Met:  Independence with exercise equipment Exercise tolerated well No report of concerns or symptoms today Strength training completed today  Goals Unmet:  Not Applicable  Comments: Pt able to follow exercise prescription today without complaint.  Will continue to monitor for progression.    Dr. Emily Filbert is Medical Director for [facility].  Dr. Ottie Glazier is Medical Director for [facility] Pulmonary Rehabilitation.

## 2021-05-20 NOTE — Telephone Encounter (Signed)
No PA is required for the OneTouch blood test strips.  RTC and med. is covered by Medicare Part B.

## 2021-05-21 ENCOUNTER — Other Ambulatory Visit: Payer: Self-pay

## 2021-05-21 ENCOUNTER — Ambulatory Visit
Admission: RE | Admit: 2021-05-21 | Discharge: 2021-05-21 | Disposition: A | Discharge status: Home / Self Care | Payer: Medicare Other | Source: Ambulatory Visit | Attending: Internal Medicine | Admitting: Internal Medicine

## 2021-05-21 DIAGNOSIS — R1013 Epigastric pain: Secondary | ICD-10-CM | POA: Diagnosis not present

## 2021-05-21 DIAGNOSIS — K219 Gastro-esophageal reflux disease without esophagitis: Secondary | ICD-10-CM | POA: Insufficient documentation

## 2021-05-24 ENCOUNTER — Other Ambulatory Visit: Payer: Self-pay

## 2021-05-24 DIAGNOSIS — I5022 Chronic systolic (congestive) heart failure: Secondary | ICD-10-CM | POA: Diagnosis not present

## 2021-05-24 NOTE — Progress Notes (Signed)
Daily Session Note  Patient Details  Name: April Park MRN: [id number] Date of Birth: 07/06/1931 Referring Provider:   Flowsheet Row Cardiac Rehab from 05/06/2021 in [facility] Cardiac and Pulmonary Rehab  Referring Provider Tiger, Shaukat       Encounter Date: 05/24/2021  Check In:  Session Check In - 05/24/21 1530       Check-In   Supervising physician immediately available to respond to emergencies See telemetry face sheet for immediately available ER MD    Location [facility]-Cardiac & Pulmonary Rehab    Staff Present Kelly Bollinger, MPA, RN;Joseph Hood, RCP,RRT,BSRT;Kara Langdon, MS, ASCM CEP, Exercise Physiologist    Virtual Visit No    Medication changes reported     No    Fall or balance concerns reported    No    Warm-up and Cool-down Performed on first and last piece of equipment    Resistance Training Performed Yes    VAD Patient? No    PAD/SET Patient? No      Pain Assessment   Currently in Pain? No/denies                Social History   Tobacco Use  Smoking Status Never  Smokeless Tobacco Never    Goals Met:  Independence with exercise equipment Exercise tolerated well No report of concerns or symptoms today Strength training completed today  Goals Unmet:  Not Applicable  Comments: Pt able to follow exercise prescription today without complaint.  Will continue to monitor for progression.    Dr. Mark Miller is Medical Director for [facility].  Dr. Fuad Aleskerov is Medical Director for [facility] Pulmonary Rehabilitation. 

## 2021-05-26 ENCOUNTER — Other Ambulatory Visit: Payer: Self-pay

## 2021-05-26 DIAGNOSIS — I5022 Chronic systolic (congestive) heart failure: Secondary | ICD-10-CM | POA: Diagnosis not present

## 2021-05-26 NOTE — Progress Notes (Signed)
Daily Session Note  Patient Details  Name: April Park MRN: [id number] Date of Birth: 1931-08-26 Referring Provider:   Flowsheet Row Cardiac Rehab from 05/06/2021 in [facility] Cardiac and Pulmonary Rehab  Referring Provider Nazia, Rhines       Encounter Date: 05/26/2021  Check In:  Session Check In - 05/26/21 1555       Check-In   Supervising physician immediately available to respond to emergencies See telemetry face sheet for immediately available ER MD    Location [facility]-Cardiac & Pulmonary Rehab    Staff Present Birdie Sons, MPA, RN;Melissa [place]sworth, RDN, Tawanna Solo, MS, ASCM CEP, Exercise Physiologist    Virtual Visit No    Medication changes reported     No    Fall or balance concerns reported    No    Warm-up and Cool-down Performed on first and last piece of equipment    Resistance Training Performed Yes    VAD Patient? No    PAD/SET Patient? No      Pain Assessment   Currently in Pain? No/denies                Social History   Tobacco Use  Smoking Status Never  Smokeless Tobacco Never    Goals Met:  Independence with exercise equipment Exercise tolerated well No report of concerns or symptoms today Strength training completed today  Goals Unmet:  Not Applicable  Comments: Pt able to follow exercise prescription today without complaint.  Will continue to monitor for progression.    Dr. Emily Filbert is Medical Director for [facility].  Dr. Ottie Glazier is Medical Director for [facility] Pulmonary Rehabilitation.

## 2021-05-27 ENCOUNTER — Encounter: Payer: Medicare Other | Admitting: *Deleted

## 2021-05-27 ENCOUNTER — Other Ambulatory Visit: Payer: Self-pay

## 2021-05-27 DIAGNOSIS — I5022 Chronic systolic (congestive) heart failure: Secondary | ICD-10-CM | POA: Diagnosis not present

## 2021-05-27 NOTE — Progress Notes (Signed)
Daily Session Note  Patient Details  Name: April Park MRN: [id number] Date of Birth: [birth year] Referring Provider:   Flowsheet Row Cardiac Rehab from 05/06/2021 in [facility] Cardiac and Pulmonary Rehab  Referring Provider Jalynne, Persico       Encounter Date: 05/27/2021  Check In:  Session Check In - 05/27/21 1544       Check-In   Supervising physician immediately available to respond to emergencies See telemetry face sheet for immediately available ER MD    Location [facility]-Cardiac & Pulmonary Rehab    Staff Present Renita Papa, RN BSN;Joseph Tessie Fass, RCP,RRT,BSRT;Kara [place], [place], ASCM CEP, Exercise Physiologist    Virtual Visit No    Medication changes reported     No    Fall or balance concerns reported    No    Warm-up and Cool-down Performed on first and last piece of equipment    Resistance Training Performed Yes    VAD Patient? No    PAD/SET Patient? No      Pain Assessment   Currently in Pain? No/denies                Social History   Tobacco Use  Smoking Status Never  Smokeless Tobacco Never    Goals Met:  Independence with exercise equipment Exercise tolerated well No report of concerns or symptoms today Strength training completed today  Goals Unmet:  Not Applicable  Comments: Pt able to follow exercise prescription today without complaint.  Will continue to monitor for progression.    Dr. Emily Filbert is Medical Director for [facility].  Dr. Ottie Glazier is Medical Director for [facility] Pulmonary Rehabilitation.

## 2021-05-31 ENCOUNTER — Other Ambulatory Visit: Payer: Self-pay

## 2021-05-31 DIAGNOSIS — I5022 Chronic systolic (congestive) heart failure: Secondary | ICD-10-CM

## 2021-05-31 NOTE — Progress Notes (Signed)
Daily Session Note  Patient Details  Name: April Park MRN: [id number] Date of Birth: 07-11-1931 Referring Provider:   Flowsheet Row Cardiac Rehab from 05/06/2021 in [facility] Cardiac and Pulmonary Rehab  Referring Provider [name], [name]       Encounter Date: 05/31/2021  Check In:  Session Check In - 05/31/21 1534       Check-In   Supervising physician immediately available to respond to emergencies See telemetry face sheet for immediately available ER MD    Location [facility]-Cardiac & Pulmonary Rehab    Staff Present Birdie Sons, MPA, RN;Joseph Lou Miner, MS, ASCM CEP, Exercise Physiologist    Virtual Visit No    Medication changes reported     No    Fall or balance concerns reported    No    Warm-up and Cool-down Performed on first and last piece of equipment    Resistance Training Performed Yes    VAD Patient? No    PAD/SET Patient? No      Pain Assessment   Currently in Pain? No/denies                Social History   Tobacco Use  Smoking Status Never  Smokeless Tobacco Never    Goals Met:  Independence with exercise equipment Exercise tolerated well No report of concerns or symptoms today Strength training completed today  Goals Unmet:  Not Applicable  Comments: Pt able to follow exercise prescription today without complaint.  Will continue to monitor for progression. Reviewed home exercise with pt today.  Pt plans to walk and use staff videos for exercise.  Reviewed THR, pulse, RPE, sign and symptoms, pulse oximetery and when to call 911 or MD.  Also discussed weather considerations and indoor options.  Pt voiced understanding.    Dr. Emily Filbert is Medical Director for [place].  Dr. Ottie Glazier is Medical Director for [facility] Pulmonary Rehabilitation.

## 2021-05-31 NOTE — Progress Notes (Signed)
Spoke briefly regarding nutrition as both Mr. and April Park no longer wanted to pursue the nutrition appointment.

## 2021-06-02 ENCOUNTER — Other Ambulatory Visit: Payer: Self-pay

## 2021-06-02 DIAGNOSIS — I5022 Chronic systolic (congestive) heart failure: Secondary | ICD-10-CM

## 2021-06-02 NOTE — Progress Notes (Signed)
Daily Session Note  Patient Details  Name: April Park MRN: [id number] Date of Birth: 10/24/1931 Referring Provider:   Flowsheet Row Cardiac Rehab from 05/06/2021 in [facility] Cardiac and Pulmonary Rehab  Referring Provider Mitchelle, Sultan       Encounter Date: 06/02/2021  Check In:  Session Check In - 06/02/21 1537       Check-In   Supervising physician immediately available to respond to emergencies See telemetry face sheet for immediately available ER MD    Location [facility]-Cardiac & Pulmonary Rehab    Staff Present Birdie Sons, MPA, Nino Glow, MS, ASCM CEP, Exercise Physiologist;Joseph Tessie Fass, [place]    Virtual Visit No    Medication changes reported     No    Fall or balance concerns reported    No    Warm-up and Cool-down Performed on first and last piece of equipment    Resistance Training Performed Yes    VAD Patient? No    PAD/SET Patient? No      Pain Assessment   Currently in Pain? No/denies                Social History   Tobacco Use  Smoking Status Never  Smokeless Tobacco Never    Goals Met:  Independence with exercise equipment Exercise tolerated well No report of concerns or symptoms today Strength training completed today  Goals Unmet:  Not Applicable  Comments: Pt able to follow exercise prescription today without complaint.  Will continue to monitor for progression.    Dr. Emily Filbert is Medical Director for [facility].  Dr. Ottie Glazier is Medical Director for [facility] Pulmonary Rehabilitation.

## 2021-06-03 ENCOUNTER — Encounter: Payer: Medicare Other | Admitting: *Deleted

## 2021-06-03 ENCOUNTER — Other Ambulatory Visit: Payer: Self-pay

## 2021-06-03 DIAGNOSIS — I5022 Chronic systolic (congestive) heart failure: Secondary | ICD-10-CM

## 2021-06-03 LAB — GLUCOSE, CAPILLARY
Glucose-Capillary: 63 mg/dL — ABNORMAL LOW (ref 70–99)
Glucose-Capillary: 73 mg/dL (ref 70–99)

## 2021-06-03 NOTE — Progress Notes (Signed)
Daily Session Note  Patient Details  Name: April Park MRN: [id number] Date of Birth: [birth year] Referring Provider:   Flowsheet Row Cardiac Rehab from 05/06/2021 in [facility] Cardiac and Pulmonary Rehab  Referring Provider Arin, Vanosdol       Encounter Date: 06/03/2021  Check In:  Session Check In - 06/03/21 1535       Check-In   Supervising physician immediately available to respond to emergencies See telemetry face sheet for immediately available ER MD    Location [facility]-Cardiac & Pulmonary Rehab    Staff Present Renita Papa, RN BSN;Melissa Munday, RDN, LDN;Jessica Luan Pulling, MA, RCEP, CCRP, CCET    Virtual Visit No    Medication changes reported     No    Fall or balance concerns reported    No    Warm-up and Cool-down Performed on first and last piece of equipment    Resistance Training Performed Yes    VAD Patient? No    PAD/SET Patient? No      Pain Assessment   Currently in Pain? No/denies                Social History   Tobacco Use  Smoking Status Never  Smokeless Tobacco Never    Goals Met:  Independence with exercise equipment Exercise tolerated well No report of concerns or symptoms today Strength training completed today  Goals Unmet:  Not Applicable  Comments: Pt able to follow exercise prescription today without complaint.  Will continue to monitor for progression.    Dr. Emily Filbert is Medical Director for [place] [facility].  Dr. Ottie Glazier is Medical Director for [facility] Pulmonary Rehabilitation.

## 2021-06-16 ENCOUNTER — Encounter: Payer: Self-pay | Admitting: *Deleted

## 2021-06-16 DIAGNOSIS — I5022 Chronic systolic (congestive) heart failure: Secondary | ICD-10-CM

## 2021-06-16 NOTE — Progress Notes (Signed)
Cardiac Individual Treatment Plan  Patient Details  Name: April Park MRN: [id number] Date of Birth: January 30, 1932 Referring Provider:   Flowsheet Row Cardiac Rehab from 05/06/2021 in [facility] Cardiac and Pulmonary Rehab  Referring Provider Neoma Laming       Initial Encounter Date:  Flowsheet Row Cardiac Rehab from 05/06/2021 in [facility] Cardiac and Pulmonary Rehab  Date 05/06/21       Visit Diagnosis: Heart failure, chronic systolic ([place])  Patient's Home Medications on Admission:  Current Outpatient Medications:    amiodarone (PACERONE) 200 MG tablet, Take 1 tablet by mouth daily., Disp: , Rfl:    calcium-vitamin D 250-100 MG-UNIT tablet, Take 1 tablet by mouth daily., Disp: , Rfl:    carvedilol (COREG) 12.5 MG tablet, Take 12.5 mg by mouth 2 (two) times daily., Disp: , Rfl:    dapagliflozin propanediol (FARXIGA) 5 MG TABS tablet, Take 5 mg by mouth daily., Disp: 90 tablet, Rfl: 1   furosemide (LASIX) 20 MG tablet, Take 20 mg by mouth as needed., Disp: , Rfl:    glimepiride (AMARYL) 2 MG tablet, Take 1 tablet (2 mg total) by mouth See admin instructions. Take 82m in the morning and 17min the evening (Patient taking differently: Take 1 mg by mouth See admin instructions. Take 34m28mn the morning and 34mg28m the evening), Disp: 180 tablet, Rfl: 1   glucose blood test strip, Use OneTouch Verio test strips as instructed to check blood sugar twice daily., Disp: 200 each, Rfl: 2   Magnesium 500 MG CAPS, Take 1 capsule by mouth daily., Disp: , Rfl:    metFORMIN (GLUCOPHAGE) 500 MG tablet, Take 500 mg by mouth 2 (two) times daily., Disp: , Rfl:    OneTouch Delica Lancets 30G 38LC, 1 each by Does not apply route See admin instructions. Use OneTouch Delica lancets to check blood sugar 2-3 times daily., Disp: 100 each, Rfl: 3   pantoprazole (PROTONIX) 40 MG tablet, Take 40 mg by mouth 2 (two) times daily., Disp: , Rfl:    polyethylene glycol (MIRALAX / GLYCOLAX) 17 g packet, Take 17 g by mouth daily.,  Disp: , Rfl:    rosuvastatin (CRESTOR) 10 MG tablet, Take 10 mg by mouth daily., Disp: , Rfl:    sacubitril-valsartan (ENTRESTO) 49-51 MG, Take 1 tablet by mouth 2 (two) times daily., Disp: , Rfl:    sitaGLIPtin (JANUVIA) 100 MG tablet, Take 1 tablet (100 mg total) by mouth daily. (Patient taking differently: Take 50 mg by mouth daily.), Disp: 90 tablet, Rfl: 2   SLOW FE 142 (45 Fe) MG TBCR, Take 1 tablet by mouth daily., Disp: , Rfl:    spironolactone (ALDACTONE) 25 MG tablet, Take 25 mg by mouth daily., Disp: , Rfl:    XARELTO 15 MG TABS tablet, Take 15 mg by mouth daily., Disp: , Rfl:   Past Medical History: Past Medical History:  Diagnosis Date   A-fib (HCC)[place] failed amio due to Gi side effects (in [place] CAD (coronary artery disease)    non-obstructive CAD on cath January 2012. mLAD 40-50%   Chest pain    CHF (congestive heart failure) (HCC)[place] due to recurrent NICM echo 06/2010: EF 20-25% mod-severe MR, moderate TR   Diabetes mellitus    GI bleed    Hyperlipidemia    Hypertension    S/P atrioventricular nodal ablation     Tobacco Use: Social History   Tobacco Use  Smoking Status Never  Smokeless Tobacco  Never    Labs: Recent Review Flowsheet Data     Labs for ITP Cardiac and Pulmonary Rehab Latest Ref Rng & Units 09/11/2013 10/18/2016 06/19/2019 08/14/2019 03/08/2021   LDLCALC - - - 104 - 45   Hemoglobin A1c - 7.4(H) 6.7(H) 6.7 7.7 6.8   PHART 7.350 - 7.400 - - - - -   PCO2ART 35.0 - 45.0 mmHg - - - - -   HCO3 20.0 - 24.0 mEq/L - - - - -   TCO2 0 - 100 mmol/L - - - - -   ACIDBASEDEF 0.0 - 2.0 mmol/L - - - - -   O2SAT % - - - - -        Exercise Target Goals: Exercise Program Goal: Individual exercise prescription set using results from initial 6 min walk test and THRR while considering  patients activity barriers and safety.   Exercise Prescription Goal: Initial exercise prescription builds to 30-45 minutes a day of aerobic activity, 2-3 days per week.   Home exercise guidelines will be given to patient during program as part of exercise prescription that the participant will acknowledge.   Education: Aerobic Exercise: - Group verbal and visual presentation on the components of exercise prescription. Introduces F.I.T.T principle from ACSM for exercise prescriptions.  Reviews F.I.T.T. principles of aerobic exercise including progression. Written material given at graduation.   Education: Resistance Exercise: - Group verbal and visual presentation on the components of exercise prescription. Introduces F.I.T.T principle from ACSM for exercise prescriptions  Reviews F.I.T.T. principles of resistance exercise including progression. Written material given at graduation.    Education: Exercise & Equipment Safety: - Individual verbal instruction and demonstration of equipment use and safety with use of the equipment. Flowsheet Row Cardiac Rehab from 06/02/2021 in [facility] Cardiac and Pulmonary Rehab  Date 05/06/21  Educator AS  Instruction Review Code 1- Verbalizes Understanding       Education: Exercise Physiology & General Exercise Guidelines: - Group verbal and written instruction with models to review the exercise physiology of the cardiovascular system and associated critical values. Provides general exercise guidelines with specific guidelines to those with heart or lung disease.    Education: Flexibility, Balance, Mind/Body Relaxation: - Group verbal and visual presentation with interactive activity on the components of exercise prescription. Introduces F.I.T.T principle from ACSM for exercise prescriptions. Reviews F.I.T.T. principles of flexibility and balance exercise training including progression. Also discusses the mind body connection.  Reviews various relaxation techniques to help reduce and manage stress (i.e. Deep breathing, progressive muscle relaxation, and visualization). Balance handout provided to take home. Written material given  at graduation.   Activity Barriers & Risk Stratification:  Activity Barriers & Cardiac Risk Stratification - 04/23/21 1346       Activity Barriers & Cardiac Risk Stratification   Activity Barriers Joint Problems;Other (comment);Balance Concerns    Comments left knee    Cardiac Risk Stratification High             6 Minute Walk:  6 Minute Walk     Row Name 05/06/21 1449         6 Minute Walk   Distance 500 feet     Walk Time 5 minutes     # of Rest Breaks 1     MPH 1.1     METS 0.13     RPE 13     Perceived Dyspnea  1     VO2 Peak 0.47     Symptoms No  Resting HR 83 bpm     Resting BP 98/64     Resting Oxygen Saturation  99 %     Exercise Oxygen Saturation  during 6 min walk 100 %     Max Ex. HR 90 bpm     Max Ex. BP 98/62     2 Minute Post BP 96/58              Oxygen Initial Assessment:   Oxygen Re-Evaluation:   Oxygen Discharge (Final Oxygen Re-Evaluation):   Initial Exercise Prescription:  Initial Exercise Prescription - 05/06/21 1500       Date of Initial Exercise RX and Referring Provider   Date 05/06/21    Referring Provider Neoma Laming      Oxygen   Maintain Oxygen Saturation 88% or higher      Treadmill   MPH 1    Grade 0    Minutes 15    METs 1      NuStep   Level 1    SPM 80    Minutes 15    METs 1      REL-XR   Level 1    Speed 50    Minutes 15    METs 1      Biostep-RELP   Level 1    SPM 50    Minutes 15    METs 1      Track   Laps 5    Minutes 15    METs 1      Prescription Details   Frequency (times per week) 3    Duration Progress to 30 minutes of continuous aerobic without signs/symptoms of physical distress      Intensity   THRR 40-80% of Max Heartrate 96-121    Ratings of Perceived Exertion 11-13    Perceived Dyspnea 0-4      Resistance Training   Training Prescription Yes    Weight 2 lb    Reps 10-15             Perform Capillary Blood Glucose checks as needed.  Exercise  Prescription Changes:   Exercise Prescription Changes     Row Name 05/06/21 1400 05/12/21 1500 05/24/21 1300 06/08/21 1500       Response to Exercise   Blood Pressure (Admit) 98/64 102/64 88/50 92/56     Blood Pressure (Exercise) 98/62 110/60 98/56 96/54     Blood Pressure (Exit) 96/58 100/58 94/58 95/68     Heart Rate (Admit) 83 bpm 70 bpm 67 bpm 69 bpm    Heart Rate (Exercise) 90 bpm 84 bpm 95 bpm 84 bpm    Heart Rate (Exit) 75 bpm 80 bpm 75 bpm 74 bpm    Oxygen Saturation (Admit) 99 % -- -- --    Oxygen Saturation (Exercise) 100 % -- -- --    Rating of Perceived Exertion (Exercise) 13 13 15 14     Perceived Dyspnea (Exercise) 1 -- -- --    Symptoms -- none none none    Comments -- first day -- --    Duration -- Progress to 30 minutes of  aerobic without signs/symptoms of physical distress Continue with 30 min of aerobic exercise without signs/symptoms of physical distress. Continue with 30 min of aerobic exercise without signs/symptoms of physical distress.    Intensity -- THRR unchanged THRR unchanged THRR unchanged      Progression   Progression -- Continue to progress workloads to maintain intensity without signs/symptoms of physical distress. Continue to  progress workloads to maintain intensity without signs/symptoms of physical distress. Continue to progress workloads to maintain intensity without signs/symptoms of physical distress.    Average METs -- 2 1.48 1.7      Resistance Training   Training Prescription -- Yes Yes Yes    Weight -- 2 lb 2 lb 2 lb    Reps -- 10-15 10-15 10-15      Interval Training   Interval Training -- -- No No      NuStep   Level -- -- 1 2    Minutes -- -- 15 15    METs -- -- 1.7 1.8      REL-XR   Level -- 1 1 1     Minutes -- 15 15 15     METs -- -- 1 1.4      Biostep-RELP   Level -- -- 1 --    Minutes -- -- 15 --    METs -- -- 1.5 --      Track   Laps -- 5 13 18     Minutes -- 15 15 15     METs -- -- 1.71 1.98      Home Exercise Plan    Plans to continue exercise at -- -- -- Home (comment)  walking and staff videos    Frequency -- -- -- Add 2 additional days to program exercise sessions.    Initial Home Exercises Provided -- -- -- 05/31/21      Oxygen   Maintain Oxygen Saturation -- -- 88% or higher 88% or higher             Exercise Comments:   Exercise Comments     Row Name 05/10/21 1605           Exercise Comments First full day of exercise!  Patient was oriented to gym and equipment including functions, settings, policies, and procedures.  Patient's individual exercise prescription and treatment plan were reviewed.  All starting workloads were established based on the results of the 6 minute walk test done at initial orientation visit.  The plan for exercise progression was also introduced and progression will be customized based on patient's performance and goals.                Exercise Goals and Review:   Exercise Goals     Row Name 05/06/21 1453             Exercise Goals   Increase Physical Activity Yes       Intervention Provide advice, education, support and counseling about physical activity/exercise needs.;Develop an individualized exercise prescription for aerobic and resistive training based on initial evaluation findings, risk stratification, comorbidities and participant's personal goals.       Expected Outcomes Short Term: Attend rehab on a regular basis to increase amount of physical activity.;Long Term: Add in home exercise to make exercise part of routine and to increase amount of physical activity.;Long Term: Exercising regularly at least 3-5 days a week.       Increase Strength and Stamina Yes       Intervention Provide advice, education, support and counseling about physical activity/exercise needs.;Develop an individualized exercise prescription for aerobic and resistive training based on initial evaluation findings, risk stratification, comorbidities and participant's  personal goals.       Expected Outcomes Short Term: Increase workloads from initial exercise prescription for resistance, speed, and METs.;Short Term: Perform resistance training exercises routinely during rehab and add in resistance training at home;Long Term:  Improve cardiorespiratory fitness, muscular endurance and strength as measured by increased METs and functional capacity (6MWT)       Able to understand and use rate of perceived exertion (RPE) scale Yes       Intervention Provide education and explanation on how to use RPE scale       Expected Outcomes Short Term: Able to use RPE daily in rehab to express subjective intensity level;Long Term:  Able to use RPE to guide intensity level when exercising independently       Able to understand and use Dyspnea scale Yes       Intervention Provide education and explanation on how to use Dyspnea scale       Expected Outcomes Short Term: Able to use Dyspnea scale daily in rehab to express subjective sense of shortness of breath during exertion;Long Term: Able to use Dyspnea scale to guide intensity level when exercising independently       Knowledge and understanding of Target Heart Rate Range (THRR) Yes       Intervention Provide education and explanation of THRR including how the numbers were predicted and where they are located for reference       Expected Outcomes Short Term: Able to state/look up THRR;Short Term: Able to use daily as guideline for intensity in rehab;Long Term: Able to use THRR to govern intensity when exercising independently       Able to check pulse independently Yes       Intervention Provide education and demonstration on how to check pulse in carotid and radial arteries.;Review the importance of being able to check your own pulse for safety during independent exercise       Expected Outcomes Short Term: Able to explain why pulse checking is important during independent exercise;Long Term: Able to check pulse independently and  accurately       Understanding of Exercise Prescription Yes       Intervention Provide education, explanation, and written materials on patient's individual exercise prescription       Expected Outcomes Short Term: Able to explain program exercise prescription;Long Term: Able to explain home exercise prescription to exercise independently                Exercise Goals Re-Evaluation :  Exercise Goals Re-Evaluation     Row Name 05/10/21 1605 05/24/21 1336 05/31/21 1610 06/08/21 1534       Exercise Goal Re-Evaluation   Exercise Goals Review Increase Physical Activity;Able to understand and use rate of perceived exertion (RPE) scale;Knowledge and understanding of Target Heart Rate Range (THRR);Understanding of Exercise Prescription;Increase Strength and Stamina;Able to understand and use Dyspnea scale;Able to check pulse independently Increase Physical Activity;Increase Strength and Stamina;Understanding of Exercise Prescription Increase Physical Activity;Increase Strength and Stamina;Able to understand and use rate of perceived exertion (RPE) scale;Knowledge and understanding of Target Heart Rate Range (THRR);Able to check pulse independently Increase Physical Activity;Increase Strength and Stamina    Comments Reviewed RPE and dyspnea scales, THR and program prescription with pt today.  Pt voiced understanding and was given a copy of goals to take home. April Park is off to a good start in rehab.  She has completed her first four full days of exercise.  We will continue to monitor her progress. Reviewed home exercise with pt today.  Pt plans to walk and use staff videos for exercise.  Reviewed THR, pulse, RPE, sign and symptoms, pulse oximetery and when to call 911 or MD.  Also discussed weather considerations and  indoor options.  Pt voiced understanding. Myrta continues to do well in rehab. She did reach up to 18 laps on the track and staff will continue to encourage her to keep meeting that, if not  more each time. She did move to up level 2 on the T4 which she tolerates well. The XR is most difficult for her to maintain her speed and will continue to monitor her workloads as she progresses.    Expected Outcomes Short: Use RPE daily to regulate intensity. Long: Follow program prescription in THR. Short: Continue to attend rehab regularly Long: Continue to follow program prescription Short: monitor HR when exrecising at home Long:  improve overall stamina Short: Work up tolerance on the XR Long: Increase overall MET level             Discharge Exercise Prescription (Final Exercise Prescription Changes):  Exercise Prescription Changes - 06/08/21 1500       Response to Exercise   Blood Pressure (Admit) 92/56    Blood Pressure (Exercise) 96/54    Blood Pressure (Exit) 95/68    Heart Rate (Admit) 69 bpm    Heart Rate (Exercise) 84 bpm    Heart Rate (Exit) 74 bpm    Rating of Perceived Exertion (Exercise) 14    Symptoms none    Duration Continue with 30 min of aerobic exercise without signs/symptoms of physical distress.    Intensity THRR unchanged      Progression   Progression Continue to progress workloads to maintain intensity without signs/symptoms of physical distress.    Average METs 1.7      Resistance Training   Training Prescription Yes    Weight 2 lb    Reps 10-15      Interval Training   Interval Training No      NuStep   Level 2    Minutes 15    METs 1.8      REL-XR   Level 1    Minutes 15    METs 1.4      Track   Laps 18    Minutes 15    METs 1.98      Home Exercise Plan   Plans to continue exercise at Home (comment)   walking and staff videos   Frequency Add 2 additional days to program exercise sessions.    Initial Home Exercises Provided 05/31/21      Oxygen   Maintain Oxygen Saturation 88% or higher             Nutrition:  Target Goals: Understanding of nutrition guidelines, daily intake of sodium <1578m, cholesterol <2048m  calories 30% from fat and 7% or less from saturated fats, daily to have 5 or more servings of fruits and vegetables.  Education: All About Nutrition: -Group instruction provided by verbal, written material, interactive activities, discussions, models, and posters to present general guidelines for heart healthy nutrition including fat, fiber, MyPlate, the role of sodium in heart healthy nutrition, utilization of the nutrition label, and utilization of this knowledge for meal planning. Follow up email sent as well. Written material given at graduation.   Biometrics:  Pre Biometrics - 05/06/21 1454       Pre Biometrics   Height 5' 1"  (1.549 m)    Weight 146 lb (66.2 kg)    BMI (Calculated) 27.6              Nutrition Therapy Plan and Nutrition Goals:  Nutrition Therapy & Goals - 05/31/21 1647  Nutrition Therapy   RD appointment deferred Yes      Personal Nutrition Goals   Nutrition Goal No goals, declined services.    Comments Past office visits document lowered appetite and chronic constipation.  A1C 6.8.  MD note 05/18/21 GERD, GI bleeding noted 04/01/21, iron deficiency due to roundworms --> bloodwork looks normal at the time of appointment, but hemoccult positive. MD note 04/07/21 mentions previous significant weight loss, but does not specify how much or when. Note also reports she has gained some weight back. Intake from MD note records breakfast as an egg with piece of bread, lunch as a "mixed meal", dinner as roti with meat or lentils, and snacks are usually fruit. Relevant medications: aldactone, lasix, iron, magnesium, miralax, crestor, januvia, farxiga, metformin, amaryl, protonix. Printed out recipes that were from [place] which were high in protein and featured good sources of fiber. Mr. Raczkowski reported that he didn't feel the RD services would help as American food is different from food from [place] and they know how to get enough protein; discussed how the  consultation would be focused on the food that they do eat and suggestions based off of that as well as addressing any other health concerns during the process. Both Mr. and [name] declined RD services.             Nutrition Assessments:  MEDIFICTS Score Key: ?70 Need to make dietary changes  40-70 Heart Healthy Diet ? 40 Therapeutic Level Cholesterol Diet   Picture Your Plate Scores: <53 Unhealthy dietary pattern with much room for improvement. 41-50 Dietary pattern unlikely to meet recommendations for good health and room for improvement. 51-60 More healthful dietary pattern, with some room for improvement.  >60 Healthy dietary pattern, although there may be some specific behaviors that could be improved.    Nutrition Goals Re-Evaluation:   Nutrition Goals Discharge (Final Nutrition Goals Re-Evaluation):   Psychosocial: Target Goals: Acknowledge presence or absence of significant depression and/or stress, maximize coping skills, provide positive support system. Participant is able to verbalize types and ability to use techniques and skills needed for reducing stress and depression.   Education: Stress, Anxiety, and Depression - Group verbal and visual presentation to define topics covered.  Reviews how body is impacted by stress, anxiety, and depression.  Also discusses healthy ways to reduce stress and to treat/manage anxiety and depression.  Written material given at graduation. Flowsheet [facility]  Date 06/02/21  Educator AS  Instruction Review Code 1- Verbalizes Understanding       Education: Sleep Hygiene -Provides group verbal and written instruction about how sleep can affect your health.  Define sleep hygiene, discuss sleep cycles and impact of sleep habits. Review good sleep hygiene tips.    Initial Review & Psychosocial Screening:  Initial Psych Review & Screening - 04/23/21 1401       Initial  Review   Current issues with Current Sleep Concerns;Current Stress Concerns    Source of Stress Concerns Chronic Illness    Comments Returning home to [place] soon      [place]? Yes   husband, 3 sons and 1 daughter plus their families     Barriers   Psychosocial barriers to participate in program The patient should benefit from training in stress management and relaxation.      Screening Interventions   Interventions Encouraged to exercise;Provide feedback about the scores to participant;To provide  support and resources with identified psychosocial needs    Expected Outcomes Short Term goal: Utilizing psychosocial counselor, staff and physician to assist with identification of specific Stressors or current issues interfering with healing process. Setting desired goal for each stressor or current issue identified.;Long Term Goal: Stressors or current issues are controlled or eliminated.;Short Term goal: Identification and review with participant of any Quality of Life or Depression concerns found by scoring the questionnaire.;Long Term goal: The participant improves quality of Life and PHQ9 Scores as seen by post scores and/or verbalization of changes             Quality of Life Scores:   Quality of Life - 04/23/21 1537       Quality of Life   Select Quality of Life      Quality of Life Scores   Health/Function Pre 18.54 %    Socioeconomic Pre 19.33 %    Psych/Spiritual Pre 24.83 %    Family Pre 30 %    GLOBAL Pre 21.76 %            Scores of 19 and below usually indicate a poorer quality of life in these areas.  A difference of  2-3 points is a clinically meaningful difference.  A difference of 2-3 points in the total score of the Quality of Life Index has been associated with significant improvement in overall quality of life, self-image, physical symptoms, and general health in studies assessing change in quality of life.  PHQ-9: Recent  Review Flowsheet Data     Depression screen [facility] 2/9 04/23/2021   Decreased Interest 2   Down, Depressed, Hopeless 0   PHQ - 2 Score 2   Altered sleeping 0   Tired, decreased energy 2   Change in appetite 0   Feeling bad or failure about yourself  0   Trouble concentrating 0   Moving slowly or fidgety/restless 0   Suicidal thoughts 0   PHQ-9 Score 4   Difficult doing work/chores Not difficult at all      Interpretation of Total Score  Total Score Depression Severity:  1-4 = Minimal depression, 5-9 = Mild depression, 10-14 = Moderate depression, 15-19 = Moderately severe depression, 20-27 = Severe depression   Psychosocial Evaluation and Intervention:  Psychosocial Evaluation - 04/23/21 1530       Psychosocial Evaluation & Interventions   Interventions Encouraged to exercise with the program and follow exercise prescription;Stress management education    Comments Ms. Culverhouse is coming to cardiac rehab for systolic heart failure. Her husband completed the program a while back and is encouraging her to participate, so are her 3 sons and 1 daughter who are physicians. She has been feeling weak, especially after her ICD placement a few weeks ago. The discomfort post ICD is causing current sleep concerns whenever she moves or rolls over. They were here visiting their children since August with a hope of returning home to [place] the end of November which now will be pushed back so she can participate in the program. Her biggest stressor is her lack of energy and stamina which she hopes to work on some while here. She mentioned being tired of all the medication she is on, but is willing to try whatever it takes to feel better.    Expected Outcomes Short: attend Cardiac Rehab for education and exercise. Long: develop and maintain positive self care habits.    Continue Psychosocial Services  Follow up required by staff  Psychosocial Re-Evaluation:  Psychosocial Re-Evaluation      [place] Name 05/31/21 1604             Psychosocial Re-Evaluation   Current issues with Current Stress Concerns;Current Sleep Concerns       Comments April Park says she sometimes sleeps better.  She reports no symptoms of depression or anxiety.       Expected Outcomes Short: continue to attend consistently to help with stress Long:  maintain positive outlook                Psychosocial Discharge (Final Psychosocial Re-Evaluation):  Psychosocial Re-Evaluation - 05/31/21 1604       Psychosocial Re-Evaluation   Current issues with Current Stress Concerns;Current Sleep Concerns    Comments April Park says she sometimes sleeps better.  She reports no symptoms of depression or anxiety.    Expected Outcomes Short: continue to attend consistently to help with stress Long:  maintain positive outlook             Vocational Rehabilitation: Provide vocational rehab assistance to qualifying candidates.   Vocational Rehab Evaluation & Intervention:  Vocational Rehab - 04/23/21 1410       Initial Vocational Rehab Evaluation & Intervention   Assessment shows need for Vocational Rehabilitation No             Education: Education Goals: Education classes will be provided on a variety of topics geared toward better understanding of heart health and risk factor modification. Participant will state understanding/return demonstration of topics presented as noted by education test scores.  Learning Barriers/Preferences:  Learning Barriers/Preferences - 04/23/21 1410       Learning Barriers/Preferences   Learning Barriers Language   understands some English, Husband to accompany her   Learning Preferences Individual Instruction             General Cardiac Education Topics:  AED/CPR: - Group verbal and written instruction with the use of models to demonstrate the basic use of the AED with the basic ABC's of resuscitation.   Anatomy and Cardiac Procedures: - Group verbal and  visual presentation and models provide information about basic cardiac anatomy and function. Reviews the testing methods done to diagnose heart disease and the outcomes of the test results. Describes the treatment choices: Medical Management, Angioplasty, or Coronary Bypass Surgery for treating various heart conditions including Myocardial Infarction, Angina, Valve Disease, and Cardiac Arrhythmias.  Written material given at graduation.   Medication Safety: - Group verbal and visual instruction to review commonly prescribed medications for heart and lung disease. Reviews the medication, class of the drug, and side effects. Includes the steps to properly store meds and maintain the prescription regimen.  Written material given at graduation.   Intimacy: - Group verbal instruction through game format to discuss how heart and lung disease can affect sexual intimacy. Written material given at graduation..   Know Your Numbers and Heart Failure: - Group verbal and visual instruction to discuss disease risk factors for cardiac and pulmonary disease and treatment options.  Reviews associated critical values for Overweight/Obesity, Hypertension, Cholesterol, and Diabetes.  Discusses basics of heart failure: signs/symptoms and treatments.  Introduces Heart Failure Zone chart for action plan for heart failure.  Written material given at graduation.   Infection Prevention: - Provides verbal and written material to individual with discussion of infection control including proper hand washing and proper equipment cleaning during exercise session. Flowsheet Row Cardiac Rehab from 06/02/2021 in [facility] Cardiac and Pulmonary Rehab  Date  05/06/21  Educator AS  Instruction Review Code 1- Verbalizes Understanding       Falls Prevention: - Provides verbal and written material to individual with discussion of falls prevention and safety. Flowsheet Row Cardiac Rehab from 06/02/2021 in [facility] Cardiac and Pulmonary  Rehab  Date 05/06/21  Educator AS  Instruction Review Code 1- Verbalizes Understanding       Other: -Provides group and verbal instruction on various topics (see comments)   Knowledge Questionnaire Score:   Core Components/Risk Factors/Patient Goals at Admission:  Personal Goals and Risk Factors at Admission - 05/06/21 1454       Core Components/Risk Factors/Patient Goals on Admission    Weight Management Yes    Intervention Weight Management: Develop a combined nutrition and exercise program designed to reach desired caloric intake, while maintaining appropriate intake of nutrient and fiber, sodium and fats, and appropriate energy expenditure required for the weight goal.;Weight Management: Provide education and appropriate resources to help participant work on and attain dietary goals.    Admit Weight 146 lb (66.2 kg)    Goal Weight: Short Term 146 lb (66.2 kg)    Goal Weight: Long Term 146 lb (66.2 kg)    Expected Outcomes Short Term: Continue to assess and modify interventions until short term weight is achieved;Long Term: Adherence to nutrition and physical activity/exercise program aimed toward attainment of established weight goal;Weight Maintenance: Understanding of the daily nutrition guidelines, which includes 25-35% calories from fat, 7% or less cal from saturated fats, less than 261m cholesterol, less than 1.5gm of sodium, & 5 or more servings of fruits and vegetables daily;Understanding recommendations for meals to include 15-35% energy as protein, 25-35% energy from fat, 35-60% energy from carbohydrates, less than 2098mof dietary cholesterol, 20-35 gm of total fiber daily;Understanding of distribution of calorie intake throughout the day with the consumption of 4-5 meals/snacks    Heart Failure Yes    Intervention Provide a combined exercise and nutrition program that is supplemented with education, support and counseling about heart failure. Directed toward relieving  symptoms such as shortness of breath, decreased exercise tolerance, and extremity edema.    Expected Outcomes Improve functional capacity of life;Short term: Attendance in program 2-3 days a week with increased exercise capacity. Reported lower sodium intake. Reported increased fruit and vegetable intake. Reports medication compliance.;Short term: Daily weights obtained and reported for increase. Utilizing diuretic protocols set by physician.;Long term: Adoption of self-care skills and reduction of barriers for early signs and symptoms recognition and intervention leading to self-care maintenance.    Hypertension Yes    Intervention Provide education on lifestyle modifcations including regular physical activity/exercise, weight management, moderate sodium restriction and increased consumption of fresh fruit, vegetables, and low fat dairy, alcohol moderation, and smoking cessation.;Monitor prescription use compliance.    Expected Outcomes Short Term: Continued assessment and intervention until BP is < 140/906mG in hypertensive participants. < 130/64m7m in hypertensive participants with diabetes, heart failure or chronic kidney disease.;Long Term: Maintenance of blood pressure at goal levels.    Lipids Yes    Intervention Provide education and support for participant on nutrition & aerobic/resistive exercise along with prescribed medications to achieve LDL <70mg69mL >40mg.29mExpected Outcomes Short Term: Participant states understanding of desired cholesterol values and is compliant with medications prescribed. Participant is following exercise prescription and nutrition guidelines.;Long Term: Cholesterol controlled with medications as prescribed, with individualized exercise RX and with personalized nutrition plan. Value goals: LDL < 70mg, 38m> 40  mg.             Education:Diabetes - Individual verbal and written instruction to review signs/symptoms of diabetes, desired ranges of glucose level  fasting, after meals and with exercise. Acknowledge that pre and post exercise glucose checks will be done for 3 sessions at entry of program. Flowsheet Row Cardiac Rehab from 04/23/2021 in [facility] Cardiac and Pulmonary Rehab  Date 04/23/21  Educator [facility]  Instruction Review Code 1- Verbalizes Understanding       Core Components/Risk Factors/Patient Goals Review:   Goals and Risk Factor Review     Row Name 05/31/21 1601             Core Components/Risk Factors/Patient Goals Review   Personal Goals Review Weight Management/Obesity;Heart Failure;Hypertension       Review Cheila does not currently her weight at home.  She says her Dr wants her to monitor weight and she has a scale.  We reviewed the importance of keeping track of weight/fluid.  She says she gets SOB when she gets dressed.  She has not seen improvement yet with exercise.  Her husband checks her BP at home.       Expected Outcomes Short:  start to weight at home Long:  Manage risk factors long term                Core Components/Risk Factors/Patient Goals at Discharge (Final Review):   Goals and Risk Factor Review - 05/31/21 1601       Core Components/Risk Factors/Patient Goals Review   Personal Goals Review Weight Management/Obesity;Heart Failure;Hypertension    Review April Park does not currently her weight at home.  She says her Dr wants her to monitor weight and she has a scale.  We reviewed the importance of keeping track of weight/fluid.  She says she gets SOB when she gets dressed.  She has not seen improvement yet with exercise.  Her husband checks her BP at home.    Expected Outcomes Short:  start to weight at home Long:  Manage risk factors long term             ITP Comments:  ITP Comments     Row Name 04/23/21 1506 05/06/21 1511 05/10/21 1605 05/19/21 0713 05/31/21 1645   ITP Comments Initial orientation completed. Diagnosis can be found in Media [facility]- Dr. Laurelyn Sickle office note 11/1. EP orientation scheduled  for Thursday 11/17 at 2pm. Completed 6MWT and gym orientation. Initial ITP created and sent for review to Dr. Emily Filbert, Medical Director. First full day of exercise!  Patient was oriented to gym and equipment including functions, settings, policies, and procedures.  Patient's individual exercise prescription and treatment plan were reviewed.  All starting workloads were established based on the results of the 6 minute walk test done at initial orientation visit.  The plan for exercise progression was also introduced and progression will be customized based on patient's performance and goals. 30 Day review completed. Medical Director ITP review done, changes made as directed, and signed approval by Medical Director.    New to program Spoke briefly regarding nutrition as both Mr. and Mrs. Ferger no longer wanted to pursue the nutrition appointment.    [place] Name 06/16/21 0733           ITP Comments 30 Day review completed. Medical Director ITP review done, changes made as directed, and signed approval by Medical Director.  Comments:

## 2021-06-21 NOTE — Progress Notes (Signed)
Compensate CHF, with severe LV systolic [id number]

## 2021-07-05 ENCOUNTER — Encounter: Payer: Medicare Other | Attending: Cardiovascular Disease

## 2021-07-05 ENCOUNTER — Telehealth: Payer: Self-pay

## 2021-07-05 DIAGNOSIS — E119 Type 2 diabetes mellitus without complications: Secondary | ICD-10-CM | POA: Insufficient documentation

## 2021-07-05 DIAGNOSIS — I5022 Chronic systolic (congestive) heart failure: Secondary | ICD-10-CM | POA: Insufficient documentation

## 2021-07-05 NOTE — Telephone Encounter (Signed)
Called patient to check up on the status of her health and returning to Cardiac Rehab. Left voicemail for patient.

## 2021-07-06 ENCOUNTER — Other Ambulatory Visit (HOSPITAL_COMMUNITY): Payer: Self-pay | Admitting: Physician Assistant

## 2021-07-06 DIAGNOSIS — M25551 Pain in right hip: Secondary | ICD-10-CM

## 2021-07-08 ENCOUNTER — Other Ambulatory Visit: Payer: Self-pay

## 2021-07-08 ENCOUNTER — Telehealth: Payer: Self-pay

## 2021-07-08 ENCOUNTER — Other Ambulatory Visit: Payer: Self-pay | Admitting: Internal Medicine

## 2021-07-08 DIAGNOSIS — I5022 Chronic systolic (congestive) heart failure: Secondary | ICD-10-CM

## 2021-07-08 DIAGNOSIS — J4520 Mild intermittent asthma, uncomplicated: Secondary | ICD-10-CM

## 2021-07-08 MED ORDER — IPRATROPIUM-ALBUTEROL 0.5-2.5 (3) MG/3ML IN SOLN: 3.0000 mL | Freq: Four times a day (QID) | RESPIRATORY_TRACT | 4 refills | Status: AC | PRN

## 2021-07-08 MED ORDER — ALBUTEROL SULFATE (2.5 MG/3ML) 0.083% IN NEBU: 2.5000 mg | INHALATION_SOLUTION | Freq: Four times a day (QID) | RESPIRATORY_TRACT | 3 refills | Status: AC | PRN

## 2021-07-08 NOTE — Telephone Encounter (Signed)
Spoke with dr Clayborn Bigness that duoneb is not covered by her plan as per change to albuterol nebulizer soln

## 2021-07-08 NOTE — Telephone Encounter (Signed)
Attempted to call patient regarding Cardiac Rehab. Need clearance to come back and have not had response back from patient. Last attended 12/15. Will send letter.

## 2021-07-14 ENCOUNTER — Encounter: Payer: Self-pay | Admitting: *Deleted

## 2021-07-14 DIAGNOSIS — I5022 Chronic systolic (congestive) heart failure: Secondary | ICD-10-CM

## 2021-07-14 NOTE — Progress Notes (Signed)
Cardiac Individual Treatment Plan  Patient Details  Name: April Park MRN: [id number] Date of Birth: 1931/11/05 Referring Provider:   Flowsheet Row Cardiac Rehab from 05/06/2021 in [facility] Cardiac and Pulmonary Rehab  Referring Provider Neoma Laming       Initial Encounter Date:  Flowsheet Row Cardiac Rehab from 05/06/2021 in [facility] Cardiac and Pulmonary Rehab  Date 05/06/21       Visit Diagnosis: Heart failure, chronic systolic (HCC)  Patient's Home Medications on Admission:  Current Outpatient Medications:    albuterol (PROVENTIL) (2.5 MG/3ML) 0.083% nebulizer solution, Take 3 mLs (2.5 mg total) by nebulization every 6 (six) hours as needed for wheezing or shortness of breath., Disp: 75 mL, Rfl: 3   amiodarone (PACERONE) 200 MG tablet, Take 1 tablet by mouth daily., Disp: , Rfl:    calcium-vitamin D 250-100 MG-UNIT tablet, Take 1 tablet by mouth daily., Disp: , Rfl:    carvedilol (COREG) 12.5 MG tablet, Take 12.5 mg by mouth 2 (two) times daily., Disp: , Rfl:    dapagliflozin propanediol (FARXIGA) 5 MG TABS tablet, Take 5 mg by mouth daily., Disp: 90 tablet, Rfl: 1   furosemide (LASIX) 20 MG tablet, Take 20 mg by mouth as needed., Disp: , Rfl:    glimepiride (AMARYL) 2 MG tablet, Take 1 tablet (2 mg total) by mouth See admin instructions. Take 66m in the morning and 139min the evening (Patient taking differently: Take 1 mg by mouth See admin instructions. Take 76m64mn the morning and 76mg176m the evening), Disp: 180 tablet, Rfl: 1   glucose blood test strip, Use OneTouch Verio test strips as instructed to check blood sugar twice daily., Disp: 200 each, Rfl: 2   ipratropium-albuterol (DUONEB) 0.5-2.5 (3) MG/3ML SOLN, Take 3 mLs by nebulization every 6 (six) hours as needed. (Patient not taking: Reported on 07/08/2021), Disp: 360 mL, Rfl: 4   Magnesium 500 MG CAPS, Take 1 capsule by mouth daily., Disp: , Rfl:    metFORMIN (GLUCOPHAGE) 500 MG tablet, Take 500 mg by mouth 2 (two) times daily.,  Disp: , Rfl:    OneTouch Delica Lancets 30G 15BC, 1 each by Does not apply route See admin instructions. Use OneTouch Delica lancets to check blood sugar 2-3 times daily., Disp: 100 each, Rfl: 3   pantoprazole (PROTONIX) 40 MG tablet, Take 40 mg by mouth 2 (two) times daily., Disp: , Rfl:    polyethylene glycol (MIRALAX / GLYCOLAX) 17 g packet, Take 17 g by mouth daily., Disp: , Rfl:    rosuvastatin (CRESTOR) 10 MG tablet, Take 10 mg by mouth daily., Disp: , Rfl:    sacubitril-valsartan (ENTRESTO) 49-51 MG, Take 1 tablet by mouth 2 (two) times daily., Disp: , Rfl:    sitaGLIPtin (JANUVIA) 100 MG tablet, Take 1 tablet (100 mg total) by mouth daily. (Patient taking differently: Take 50 mg by mouth daily.), Disp: 90 tablet, Rfl: 2   SLOW FE 142 (45 Fe) MG TBCR, Take 1 tablet by mouth daily., Disp: , Rfl:    spironolactone (ALDACTONE) 25 MG tablet, Take 25 mg by mouth daily., Disp: , Rfl:    XARELTO 15 MG TABS tablet, Take 15 mg by mouth daily., Disp: , Rfl:   Past Medical History: Past Medical History:  Diagnosis Date   A-fib (HCC)[place] failed amio due to Gi side effects (in [place] CAD (coronary artery disease)    non-obstructive CAD on cath January 2012. mLAD 40-50%   Chest pain  CHF (congestive heart failure) (HCC)    due to recurrent NICM echo 06/2010: EF 20-25% mod-severe MR, moderate TR   Diabetes mellitus    GI bleed    Hyperlipidemia    Hypertension    S/P atrioventricular nodal ablation     Tobacco Use: Social History   Tobacco Use  Smoking Status Never  Smokeless Tobacco Never    Labs: Recent Review Flowsheet Data     Labs for ITP Cardiac and Pulmonary Rehab Latest Ref Rng & Units 09/11/2013 10/18/2016 06/19/2019 08/14/2019 03/08/2021   LDLCALC - - - 104 - 45   Hemoglobin A1c - 7.4(H) 6.7(H) 6.7 7.7 6.8   PHART 7.350 - 7.400 - - - - -   PCO2ART 35.0 - 45.0 mmHg - - - - -   HCO3 20.0 - 24.0 mEq/L - - - - -   TCO2 0 - 100 mmol/L - - - - -   ACIDBASEDEF 0.0 - 2.0  mmol/L - - - - -   O2SAT % - - - - -        Exercise Target Goals: Exercise Program Goal: Individual exercise prescription set using results from initial 6 min walk test and THRR while considering  patients activity barriers and safety.   Exercise Prescription Goal: Initial exercise prescription builds to 30-45 minutes a day of aerobic activity, 2-3 days per week.  Home exercise guidelines will be given to patient during program as part of exercise prescription that the participant will acknowledge.   Education: Aerobic Exercise: - Group verbal and visual presentation on the components of exercise prescription. Introduces F.I.T.T principle from ACSM for exercise prescriptions.  Reviews F.I.T.T. principles of aerobic exercise including progression. Written material given at graduation.   Education: Resistance Exercise: - Group verbal and visual presentation on the components of exercise prescription. Introduces F.I.T.T principle from ACSM for exercise prescriptions  Reviews F.I.T.T. principles of resistance exercise including progression. Written material given at graduation.    Education: Exercise & Equipment Safety: - Individual verbal instruction and demonstration of equipment use and safety with use of the equipment. Flowsheet Row Cardiac Rehab from 06/02/2021 in [facility] Cardiac and Pulmonary Rehab  Date 05/06/21  Educator AS  Instruction Review Code 1- Verbalizes Understanding       Education: Exercise Physiology & General Exercise Guidelines: - Group verbal and written instruction with models to review the exercise physiology of the cardiovascular system and associated critical values. Provides general exercise guidelines with specific guidelines to those with heart or lung disease.    Education: Flexibility, Balance, Mind/Body Relaxation: - Group verbal and visual presentation with interactive activity on the components of exercise prescription. Introduces F.I.T.T principle  from ACSM for exercise prescriptions. Reviews F.I.T.T. principles of flexibility and balance exercise training including progression. Also discusses the mind body connection.  Reviews various relaxation techniques to help reduce and manage stress (i.e. Deep breathing, progressive muscle relaxation, and visualization). Balance handout provided to take home. Written material given at graduation.   Activity Barriers & Risk Stratification:  Activity Barriers & Cardiac Risk Stratification - 04/23/21 1346       Activity Barriers & Cardiac Risk Stratification   Activity Barriers Joint Problems;Other (comment);Balance Concerns    Comments left knee    Cardiac Risk Stratification High             6 Minute Walk:  6 Minute Walk     Row Name 05/06/21 1449         6 Minute Walk  Distance 500 feet     Walk Time 5 minutes     # of Rest Breaks 1     MPH 1.1     METS 0.13     RPE 13     Perceived Dyspnea  1     VO2 Peak 0.47     Symptoms No     Resting HR 83 bpm     Resting BP 98/64     Resting Oxygen Saturation  99 %     Exercise Oxygen Saturation  during 6 min walk 100 %     Max Ex. HR 90 bpm     Max Ex. BP 98/62     2 Minute Post BP 96/58              Oxygen Initial Assessment:   Oxygen Re-Evaluation:   Oxygen Discharge (Final Oxygen Re-Evaluation):   Initial Exercise Prescription:  Initial Exercise Prescription - 05/06/21 1500       Date of Initial Exercise RX and Referring Provider   Date 05/06/21    Referring Provider Neoma Laming      Oxygen   Maintain Oxygen Saturation 88% or higher      Treadmill   MPH 1    Grade 0    Minutes 15    METs 1      NuStep   Level 1    SPM 80    Minutes 15    METs 1      REL-XR   Level 1    Speed 50    Minutes 15    METs 1      Biostep-RELP   Level 1    SPM 50    Minutes 15    METs 1      Track   Laps 5    Minutes 15    METs 1      Prescription Details   Frequency (times per week) 3    Duration  Progress to 30 minutes of continuous aerobic without signs/symptoms of physical distress      Intensity   THRR 40-80% of Max Heartrate 96-121    Ratings of Perceived Exertion 11-13    Perceived Dyspnea 0-4      Resistance Training   Training Prescription Yes    Weight 2 lb    Reps 10-15             Perform Capillary Blood Glucose checks as needed.  Exercise Prescription Changes:   Exercise Prescription Changes     Row Name 05/06/21 1400 05/12/21 1500 05/24/21 1300 06/08/21 1500       Response to Exercise   Blood Pressure (Admit) 98/64 102/64 88/50 92/56     Blood Pressure (Exercise) 98/62 110/60 98/56 96/54     Blood Pressure (Exit) 96/58 100/58 94/58 95/68     Heart Rate (Admit) 83 bpm 70 bpm 67 bpm 69 bpm    Heart Rate (Exercise) 90 bpm 84 bpm 95 bpm 84 bpm    Heart Rate (Exit) 75 bpm 80 bpm 75 bpm 74 bpm    Oxygen Saturation (Admit) 99 % -- -- --    Oxygen Saturation (Exercise) 100 % -- -- --    Rating of Perceived Exertion (Exercise) 13 13 15 14     Perceived Dyspnea (Exercise) 1 -- -- --    Symptoms -- none none none    Comments -- first day -- --    Duration -- Progress to 30 minutes of  aerobic without  signs/symptoms of physical distress Continue with 30 min of aerobic exercise without signs/symptoms of physical distress. Continue with 30 min of aerobic exercise without signs/symptoms of physical distress.    Intensity -- THRR unchanged THRR unchanged THRR unchanged      Progression   Progression -- Continue to progress workloads to maintain intensity without signs/symptoms of physical distress. Continue to progress workloads to maintain intensity without signs/symptoms of physical distress. Continue to progress workloads to maintain intensity without signs/symptoms of physical distress.    Average METs -- 2 1.48 1.7      Resistance Training   Training Prescription -- Yes Yes Yes    Weight -- 2 lb 2 lb 2 lb    Reps -- 10-15 10-15 10-15      Interval Training    Interval Training -- -- No No      NuStep   Level -- -- 1 2    Minutes -- -- 15 15    METs -- -- 1.7 1.8      REL-XR   Level -- 1 1 1     Minutes -- 15 15 15     METs -- -- 1 1.4      Biostep-RELP   Level -- -- 1 --    Minutes -- -- 15 --    METs -- -- 1.5 --      Track   Laps -- 5 13 18     Minutes -- 15 15 15     METs -- -- 1.71 1.98      Home Exercise Plan   Plans to continue exercise at -- -- -- Home (comment)  walking and staff videos    Frequency -- -- -- Add 2 additional days to program exercise sessions.    Initial Home Exercises Provided -- -- -- 05/31/21      Oxygen   Maintain Oxygen Saturation -- -- 88% or higher 88% or higher             Exercise Comments:   Exercise Comments     Row Name 05/10/21 1605           Exercise Comments First full day of exercise!  Patient was oriented to gym and equipment including functions, settings, policies, and procedures.  Patient's individual exercise prescription and treatment plan were reviewed.  All starting workloads were established based on the results of the 6 minute walk test done at initial orientation visit.  The plan for exercise progression was also introduced and progression will be customized based on patient's performance and goals.                Exercise Goals and Review:   Exercise Goals     Row Name 05/06/21 1453             Exercise Goals   Increase Physical Activity Yes       Intervention Provide advice, education, support and counseling about physical activity/exercise needs.;Develop an individualized exercise prescription for aerobic and resistive training based on initial evaluation findings, risk stratification, comorbidities and participant's personal goals.       Expected Outcomes Short Term: Attend rehab on a regular basis to increase amount of physical activity.;Long Term: Add in home exercise to make exercise part of routine and to increase amount of physical activity.;Long Term:  Exercising regularly at least 3-5 days a week.       Increase Strength and Stamina Yes       Intervention Provide advice, education, support and  counseling about physical activity/exercise needs.;Develop an individualized exercise prescription for aerobic and resistive training based on initial evaluation findings, risk stratification, comorbidities and participant's personal goals.       Expected Outcomes Short Term: Increase workloads from initial exercise prescription for resistance, speed, and METs.;Short Term: Perform resistance training exercises routinely during rehab and add in resistance training at home;Long Term: Improve cardiorespiratory fitness, muscular endurance and strength as measured by increased METs and functional capacity (6MWT)       Able to understand and use rate of perceived exertion (RPE) scale Yes       Intervention Provide education and explanation on how to use RPE scale       Expected Outcomes Short Term: Able to use RPE daily in rehab to express subjective intensity level;Long Term:  Able to use RPE to guide intensity level when exercising independently       Able to understand and use Dyspnea scale Yes       Intervention Provide education and explanation on how to use Dyspnea scale       Expected Outcomes Short Term: Able to use Dyspnea scale daily in rehab to express subjective sense of shortness of breath during exertion;Long Term: Able to use Dyspnea scale to guide intensity level when exercising independently       Knowledge and understanding of Target Heart Rate Range (THRR) Yes       Intervention Provide education and explanation of THRR including how the numbers were predicted and where they are located for reference       Expected Outcomes Short Term: Able to state/look up THRR;Short Term: Able to use daily as guideline for intensity in rehab;Long Term: Able to use THRR to govern intensity when exercising independently       Able to check pulse independently Yes        Intervention Provide education and demonstration on how to check pulse in carotid and radial arteries.;Review the importance of being able to check your own pulse for safety during independent exercise       Expected Outcomes Short Term: Able to explain why pulse checking is important during independent exercise;Long Term: Able to check pulse independently and accurately       Understanding of Exercise Prescription Yes       Intervention Provide education, explanation, and written materials on patient's individual exercise prescription       Expected Outcomes Short Term: Able to explain program exercise prescription;Long Term: Able to explain home exercise prescription to exercise independently                Exercise Goals Re-Evaluation :  Exercise Goals Re-Evaluation     Row Name 05/10/21 1605 05/24/21 1336 05/31/21 1610 06/08/21 1534 06/23/21 1017     Exercise Goal Re-Evaluation   Exercise Goals Review Increase Physical Activity;Able to understand and use rate of perceived exertion (RPE) scale;Knowledge and understanding of Target Heart Rate Range (THRR);Understanding of Exercise Prescription;Increase Strength and Stamina;Able to understand and use Dyspnea scale;Able to check pulse independently Increase Physical Activity;Increase Strength and Stamina;Understanding of Exercise Prescription Increase Physical Activity;Increase Strength and Stamina;Able to understand and use rate of perceived exertion (RPE) scale;Knowledge and understanding of Target Heart Rate Range (THRR);Able to check pulse independently Increase Physical Activity;Increase Strength and Stamina Increase Physical Activity;Increase Strength and Stamina   Comments Reviewed RPE and dyspnea scales, THR and program prescription with pt today.  Pt voiced understanding and was given a copy of goals to take home. Mrs.  April Park is off to a good start in rehab.  She has completed her first four full days of exercise.  We will continue to  monitor her progress. Reviewed home exercise with pt today.  Pt plans to walk and use staff videos for exercise.  Reviewed THR, pulse, RPE, sign and symptoms, pulse oximetery and when to call 911 or MD.  Also discussed weather considerations and indoor options.  Pt voiced understanding. April Park continues to do well in rehab. She did reach up to 18 laps on the track and staff will continue to encourage her to keep meeting that, if not more each time. She did move to up level 2 on the T4 which she tolerates well. The XR is most difficult for her to maintain her speed and will continue to monitor her workloads as she progresses. April Park has not attended rehab since last review. She has been admitted in the hospital and staff will check on updates periodically.   Expected Outcomes Short: Use RPE daily to regulate intensity. Long: Follow program prescription in THR. Short: Continue to attend rehab regularly Long: Continue to follow program prescription Short: monitor HR when exrecising at home Long:  improve overall stamina Short: Work up tolerance on the XR Long: Increase overall MET level Short: Start HeartTrack when cleared and appropriate to do so Long: Continue to build up overall strength, stamina, and MET level            Discharge Exercise Prescription (Final Exercise Prescription Changes):  Exercise Prescription Changes - 06/08/21 1500       Response to Exercise   Blood Pressure (Admit) 92/56    Blood Pressure (Exercise) 96/54    Blood Pressure (Exit) 95/68    Heart Rate (Admit) 69 bpm    Heart Rate (Exercise) 84 bpm    Heart Rate (Exit) 74 bpm    Rating of Perceived Exertion (Exercise) 14    Symptoms none    Duration Continue with 30 min of aerobic exercise without signs/symptoms of physical distress.    Intensity THRR unchanged      Progression   Progression Continue to progress workloads to maintain intensity without signs/symptoms of physical distress.    Average METs 1.7       Resistance Training   Training Prescription Yes    Weight 2 lb    Reps 10-15      Interval Training   Interval Training No      NuStep   Level 2    Minutes 15    METs 1.8      REL-XR   Level 1    Minutes 15    METs 1.4      Track   Laps 18    Minutes 15    METs 1.98      Home Exercise Plan   Plans to continue exercise at Home (comment)   walking and staff videos   Frequency Add 2 additional days to program exercise sessions.    Initial Home Exercises Provided 05/31/21      Oxygen   Maintain Oxygen Saturation 88% or higher             Nutrition:  Target Goals: Understanding of nutrition guidelines, daily intake of sodium <1563m, cholesterol <2071m calories 30% from fat and 7% or less from saturated fats, daily to have 5 or more servings of fruits and vegetables.  Education: All About Nutrition: -Group instruction provided by verbal, written material, interactive activities, discussions, models, and posters to  present general guidelines for heart healthy nutrition including fat, fiber, MyPlate, the role of sodium in heart healthy nutrition, utilization of the nutrition label, and utilization of this knowledge for meal planning. Follow up email sent as well. Written material given at graduation.   Biometrics:  Pre Biometrics - 05/06/21 1454       Pre Biometrics   Height 5' 1"  (1.549 m)    Weight 146 lb (66.2 kg)    BMI (Calculated) 27.6              Nutrition Therapy Plan and Nutrition Goals:  Nutrition Therapy & Goals - 05/31/21 1647       Nutrition Therapy   RD appointment deferred Yes      Personal Nutrition Goals   Nutrition Goal No goals, declined services.    Comments Past office visits document lowered appetite and chronic constipation.  A1C 6.8.  MD note 05/18/21 GERD, GI bleeding noted 04/01/21, iron deficiency due to roundworms --> bloodwork looks normal at the time of appointment, but hemoccult positive. MD note 04/07/21 mentions previous  significant weight loss, but does not specify how much or when. Note also reports she has gained some weight back. Intake from MD note records breakfast as an egg with piece of bread, lunch as a "mixed meal", dinner as roti with meat or lentils, and snacks are usually fruit. Relevant medications: aldactone, lasix, iron, magnesium, miralax, crestor, januvia, farxiga, metformin, amaryl, protonix. Printed out recipes that were from [place] which were high in protein and featured good sources of fiber. Mr. Peery reported that he didn't feel the RD services would help as American food is different from food from [place] and they know how to get enough protein; discussed how the consultation would be focused on the food that they do eat and suggestions based off of that as well as addressing any other health concerns during the process. Both Mr. and April Park declined RD services.             Nutrition Assessments:  MEDIFICTS Score Key: ?70 Need to make dietary changes  40-70 Heart Healthy Diet ? 40 Therapeutic Level Cholesterol Diet   Picture Your Plate Scores: <58 Unhealthy dietary pattern with much room for improvement. 41-50 Dietary pattern unlikely to meet recommendations for good health and room for improvement. 51-60 More healthful dietary pattern, with some room for improvement.  >60 Healthy dietary pattern, although there may be some specific behaviors that could be improved.    Nutrition Goals Re-Evaluation:   Nutrition Goals Discharge (Final Nutrition Goals Re-Evaluation):   Psychosocial: Target Goals: Acknowledge presence or absence of significant depression and/or stress, maximize coping skills, provide positive support system. Participant is able to verbalize types and ability to use techniques and skills needed for reducing stress and depression.   Education: Stress, Anxiety, and Depression - Group verbal and visual presentation to define topics covered.  Reviews how body  is impacted by stress, anxiety, and depression.  Also discusses healthy ways to reduce stress and to treat/manage anxiety and depression.  Written material given at graduation. Flowsheet Row Cardiac Rehab from 06/02/2021 in [facility] Cardiac and Pulmonary Rehab  Date 06/02/21  Educator AS  Instruction Review Code 1- Verbalizes Understanding       Education: Sleep Hygiene -Provides group verbal and written instruction about how sleep can affect your health.  Define sleep hygiene, discuss sleep cycles and impact of sleep habits. Review good sleep hygiene tips.    Initial Review & Psychosocial  Screening:  Initial Psych Review & Screening - 04/23/21 1401       Initial Review   Current issues with Current Sleep Concerns;Current Stress Concerns    Source of Stress Concerns Chronic Illness    Comments Returning home to [place] soon      [place]? Yes   husband, 3 sons and 1 daughter plus their families     Barriers   Psychosocial barriers to participate in program The patient should benefit from training in stress management and relaxation.      Screening Interventions   Interventions Encouraged to exercise;Provide feedback about the scores to participant;To provide support and resources with identified psychosocial needs    Expected Outcomes Short Term goal: Utilizing psychosocial counselor, staff and physician to assist with identification of specific Stressors or current issues interfering with healing process. Setting desired goal for each stressor or current issue identified.;Long Term Goal: Stressors or current issues are controlled or eliminated.;Short Term goal: Identification and review with participant of any Quality of Life or Depression concerns found by scoring the questionnaire.;Long Term goal: The participant improves quality of Life and PHQ9 Scores as seen by post scores and/or verbalization of changes             Quality of Life Scores:    Quality of Life - 04/23/21 1537       Quality of Life   Select Quality of Life      Quality of Life Scores   Health/Function Pre 18.54 %    Socioeconomic Pre 19.33 %    Psych/Spiritual Pre 24.83 %    Family Pre 30 %    GLOBAL Pre 21.76 %            Scores of 19 and below usually indicate a poorer quality of life in these areas.  A difference of  2-3 points is a clinically meaningful difference.  A difference of 2-3 points in the total score of the Quality of Life Index has been associated with significant improvement in overall quality of life, self-image, physical symptoms, and general health in studies assessing change in quality of life.  PHQ-9: Recent Review Flowsheet Data     Depression screen [facility] 2/9 04/23/2021   Decreased Interest 2   Down, Depressed, Hopeless 0   PHQ - 2 Score 2   Altered sleeping 0   Tired, decreased energy 2   Change in appetite 0   Feeling bad or failure about yourself  0   Trouble concentrating 0   Moving slowly or fidgety/restless 0   Suicidal thoughts 0   PHQ-9 Score 4   Difficult doing work/chores Not difficult at all      Interpretation of Total Score  Total Score Depression Severity:  1-4 = Minimal depression, 5-9 = Mild depression, 10-14 = Moderate depression, 15-19 = Moderately severe depression, 20-27 = Severe depression   Psychosocial Evaluation and Intervention:  Psychosocial Evaluation - 04/23/21 1530       Psychosocial Evaluation & Interventions   Interventions Encouraged to exercise with the program and follow exercise prescription;Stress management education    Comments Ms. April Park is coming to cardiac rehab for systolic heart failure. Her husband completed the program a while back and is encouraging her to participate, so are her 3 sons and 1 daughter who are physicians. She has been feeling weak, especially after her ICD placement a few weeks ago. The discomfort post ICD is causing current sleep concerns whenever she  moves or  Park over. They were here visiting their children since August with a hope of returning home to [place] the end of November which now will be pushed back so she can participate in the program. Her biggest stressor is her lack of energy and stamina which she hopes to work on some while here. She mentioned being tired of all the medication she is on, but is willing to try whatever it takes to feel better.    Expected Outcomes Short: attend Cardiac Rehab for education and exercise. Long: develop and maintain positive self care habits.    Continue Psychosocial Services  Follow up required by staff             Psychosocial Re-Evaluation:  Psychosocial Re-Evaluation     [place] Name 05/31/21 1604             Psychosocial Re-Evaluation   Current issues with Current Stress Concerns;Current Sleep Concerns       Comments April Park says she sometimes sleeps better.  She reports no symptoms of depression or anxiety.       Expected Outcomes Short: continue to attend consistently to help with stress Long:  maintain positive outlook                Psychosocial Discharge (Final Psychosocial Re-Evaluation):  Psychosocial Re-Evaluation - 05/31/21 1604       Psychosocial Re-Evaluation   Current issues with Current Stress Concerns;Current Sleep Concerns    Comments April Park says she sometimes sleeps better.  She reports no symptoms of depression or anxiety.    Expected Outcomes Short: continue to attend consistently to help with stress Long:  maintain positive outlook             Vocational Rehabilitation: Provide vocational rehab assistance to qualifying candidates.   Vocational Rehab Evaluation & Intervention:  Vocational Rehab - 04/23/21 1410       Initial Vocational Rehab Evaluation & Intervention   Assessment shows need for Vocational Rehabilitation No             Education: Education Goals: Education classes will be provided on a variety of topics geared toward better  understanding of heart health and risk factor modification. Participant will state understanding/return demonstration of topics presented as noted by education test scores.  Learning Barriers/Preferences:  Learning Barriers/Preferences - 04/23/21 1410       Learning Barriers/Preferences   Learning Barriers Language   understands some English, Husband to accompany her   Learning Preferences Individual Instruction             General Cardiac Education Topics:  AED/CPR: - Group verbal and written instruction with the use of models to demonstrate the basic use of the AED with the basic ABC's of resuscitation.   Anatomy and Cardiac Procedures: - Group verbal and visual presentation and models provide information about basic cardiac anatomy and function. Reviews the testing methods done to diagnose heart disease and the outcomes of the test results. Describes the treatment choices: Medical Management, Angioplasty, or Coronary Bypass Surgery for treating various heart conditions including Myocardial Infarction, Angina, Valve Disease, and Cardiac Arrhythmias.  Written material given at graduation.   Medication Safety: - Group verbal and visual instruction to review commonly prescribed medications for heart and lung disease. Reviews the medication, class of the drug, and side effects. Includes the steps to properly store meds and maintain the prescription regimen.  Written material given at graduation.   Intimacy: - Group verbal instruction through game format  to discuss how heart and lung disease can affect sexual intimacy. Written material given at graduation..   Know Your Numbers and Heart Failure: - Group verbal and visual instruction to discuss disease risk factors for cardiac and pulmonary disease and treatment options.  Reviews associated critical values for Overweight/Obesity, Hypertension, Cholesterol, and Diabetes.  Discusses basics of heart failure: signs/symptoms and treatments.   Introduces Heart Failure Zone chart for action plan for heart failure.  Written material given at graduation.   Infection Prevention: - Provides verbal and written material to individual with discussion of infection control including proper hand washing and proper equipment cleaning during exercise session. Flowsheet Row Cardiac Rehab from 06/02/2021 in [facility] Cardiac and Pulmonary Rehab  Date 05/06/21  Educator AS  Instruction Review Code 1- Verbalizes Understanding       Falls Prevention: - Provides verbal and written material to individual with discussion of falls prevention and safety. Flowsheet Row Cardiac Rehab from 06/02/2021 in [facility] Cardiac and Pulmonary Rehab  Date 05/06/21  Educator AS  Instruction Review Code 1- Verbalizes Understanding       Other: -Provides group and verbal instruction on various topics (see comments)   Knowledge Questionnaire Score:   Core Components/Risk Factors/Patient Goals at Admission:  Personal Goals and Risk Factors at Admission - 05/06/21 1454       Core Components/Risk Factors/Patient Goals on Admission    Weight Management Yes    Intervention Weight Management: Develop a combined nutrition and exercise program designed to reach desired caloric intake, while maintaining appropriate intake of nutrient and fiber, sodium and fats, and appropriate energy expenditure required for the weight goal.;Weight Management: Provide education and appropriate resources to help participant work on and attain dietary goals.    Admit Weight 146 lb (66.2 kg)    Goal Weight: Short Term 146 lb (66.2 kg)    Goal Weight: Long Term 146 lb (66.2 kg)    Expected Outcomes Short Term: Continue to assess and modify interventions until short term weight is achieved;Long Term: Adherence to nutrition and physical activity/exercise program aimed toward attainment of established weight goal;Weight Maintenance: Understanding of the daily nutrition guidelines, which includes  25-35% calories from fat, 7% or less cal from saturated fats, less than 220m cholesterol, less than 1.5gm of sodium, & 5 or more servings of fruits and vegetables daily;Understanding recommendations for meals to include 15-35% energy as protein, 25-35% energy from fat, 35-60% energy from carbohydrates, less than 2074mof dietary cholesterol, 20-35 gm of total fiber daily;Understanding of distribution of calorie intake throughout the day with the consumption of 4-5 meals/snacks    Heart Failure Yes    Intervention Provide a combined exercise and nutrition program that is supplemented with education, support and counseling about heart failure. Directed toward relieving symptoms such as shortness of breath, decreased exercise tolerance, and extremity edema.    Expected Outcomes Improve functional capacity of life;Short term: Attendance in program 2-3 days a week with increased exercise capacity. Reported lower sodium intake. Reported increased fruit and vegetable intake. Reports medication compliance.;Short term: Daily weights obtained and reported for increase. Utilizing diuretic protocols set by physician.;Long term: Adoption of self-care skills and reduction of barriers for early signs and symptoms recognition and intervention leading to self-care maintenance.    Hypertension Yes    Intervention Provide education on lifestyle modifcations including regular physical activity/exercise, weight management, moderate sodium restriction and increased consumption of fresh fruit, vegetables, and low fat dairy, alcohol moderation, and smoking cessation.;Monitor prescription use compliance.  Expected Outcomes Short Term: Continued assessment and intervention until BP is < 140/35m HG in hypertensive participants. < 130/889mHG in hypertensive participants with diabetes, heart failure or chronic kidney disease.;Long Term: Maintenance of blood pressure at goal levels.    Lipids Yes    Intervention Provide education  and support for participant on nutrition & aerobic/resistive exercise along with prescribed medications to achieve LDL <704mHDL >64m68m  Expected Outcomes Short Term: Participant states understanding of desired cholesterol values and is compliant with medications prescribed. Participant is following exercise prescription and nutrition guidelines.;Long Term: Cholesterol controlled with medications as prescribed, with individualized exercise RX and with personalized nutrition plan. Value goals: LDL < 70mg39mL > 40 mg.             Education:Diabetes - Individual verbal and written instruction to review signs/symptoms of diabetes, desired ranges of glucose level fasting, after meals and with exercise. Acknowledge that pre and post exercise glucose checks will be done for 3 sessions at entry of program. Flowsheet Row [facility]  Date 04/23/21  Educator MC  [facility] [place] [facility] Inctruction Review Code 1- Verbalizes Understanding       Core Components/Risk Factors/Patient Goals Review:   Goals and Risk Factor Review     Row Name 05/31/21 1601             Core Components/Risk Factors/Patient Goals Review   Personal Goals Review Weight Management/Obesity;Heart Failure;Hypertension       Review April Park not currently her weight at home.  She says her Dr wants her to monitor weight and she has a scale.  We reviewed the importance of keeping track of weight/fluid.  She says she gets SOB when she gets dressed.  She has not seen improvement yet with exercise.  Her husband checks her BP at home.       Expected Outcomes Short:  start to weight at home Long:  Manage risk factors long term                Core Components/Risk Factors/Patient Goals at Discharge (Final Review):   Goals and Risk Factor Review - 05/31/21 1601       Core Components/Risk Factors/Patient Goals Review   Personal Goals Review Weight Management/Obesity;Heart Failure;Hypertension     Review April Park not currently her weight at home.  She says her Dr wants her to monitor weight and she has a scale.  We reviewed the importance of keeping track of weight/fluid.  She says she gets SOB when she gets dressed.  She has not seen improvement yet with exercise.  Her husband checks her BP at home.    Expected Outcomes Short:  start to weight at home Long:  Manage risk factors long term             ITP Comments:  ITP Comments     Row Name 04/23/21 1506 05/06/21 1511 05/10/21 1605 05/19/21 0713 05/31/21 1645   ITP Comments Initial orientation completed. Diagnosis can be found in Media Tab- Dr. Frees'Laurelyn Sicklece note 11/1. EP orientation scheduled for Thursday 11/17 at 2pm. Completed 6MWT and gym orientation. Initial ITP created and sent for review to Dr. Mark Emily Filbertical Director. First full day of exercise!  Patient was oriented to gym and equipment including functions, settings, policies, and procedures.  Patient's individual exercise prescription and treatment plan were reviewed.  All starting workloads were established based on the results of the 6 minute walk test  done at initial orientation visit.  The plan for exercise progression was also introduced and progression will be customized based on patient's performance and goals. 30 Day review completed. Medical Director ITP review done, changes made as directed, and signed approval by Medical Director.    New to program Spoke briefly regarding nutrition as both Mr. and Mrs. Schooley no longer wanted to pursue the nutrition appointment.    [place] Name 06/16/21 0733 06/23/21 1019 07/08/21 1536 07/14/21 0901     ITP Comments 30 Day review completed. Medical Director ITP review done, changes made as directed, and signed approval by Medical Director. April Park has not attended rehab since 12/15. She has been admitted in the hospital and staff will check on updates periodically. Attempted to call patient. Need clearance to come back and have not  had response back from patient. Last attended 12/15. Will send letter. 30 Day review completed. Medical Director ITP review done, changes made as directed, and signed approval by Medical Director.   no visits since last review             Comments:

## 2021-07-20 ENCOUNTER — Encounter: Payer: Self-pay | Admitting: *Deleted

## 2021-07-20 DIAGNOSIS — I5022 Chronic systolic (congestive) heart failure: Secondary | ICD-10-CM

## 2021-07-22 DIAGNOSIS — I5022 Chronic systolic (congestive) heart failure: Secondary | ICD-10-CM

## 2021-07-22 NOTE — Progress Notes (Signed)
Cardiac Individual Treatment Plan  Patient Details  Name: April Park MRN: [id number] Date of Birth: 1931/07/11 Referring Provider:   Flowsheet Row Cardiac Rehab from 05/06/2021 in [facility] Cardiac and Pulmonary Rehab  Referring Provider Neoma Laming       Initial Encounter Date:  Flowsheet Row Cardiac Rehab from 05/06/2021 in [facility] Cardiac and Pulmonary Rehab  Date 05/06/21       Visit Diagnosis: Heart failure, chronic systolic (HCC)  Patient's Home Medications on Admission:  Current Outpatient Medications:    albuterol (PROVENTIL) (2.5 MG/3ML) 0.083% nebulizer solution, Take 3 mLs (2.5 mg total) by nebulization every 6 (six) hours as needed for wheezing or shortness of breath., Disp: 75 mL, Rfl: 3   amiodarone (PACERONE) 200 MG tablet, Take 1 tablet by mouth daily., Disp: , Rfl:    calcium-vitamin D 250-100 MG-UNIT tablet, Take 1 tablet by mouth daily., Disp: , Rfl:    carvedilol (COREG) 12.5 MG tablet, Take 12.5 mg by mouth 2 (two) times daily., Disp: , Rfl:    dapagliflozin propanediol (FARXIGA) 5 MG TABS tablet, Take 5 mg by mouth daily., Disp: 90 tablet, Rfl: 1   furosemide (LASIX) 20 MG tablet, Take 20 mg by mouth as needed., Disp: , Rfl:    glimepiride (AMARYL) 2 MG tablet, Take 1 tablet (2 mg total) by mouth See admin instructions. Take 36m in the morning and 157min the evening (Patient taking differently: Take 1 mg by mouth See admin instructions. Take 27m50mn the morning and 27mg87m the evening), Disp: 180 tablet, Rfl: 1   glucose blood test strip, Use OneTouch Verio test strips as instructed to check blood sugar twice daily., Disp: 200 each, Rfl: 2   ipratropium-albuterol (DUONEB) 0.5-2.5 (3) MG/3ML SOLN, Take 3 mLs by nebulization every 6 (six) hours as needed. (Patient not taking: Reported on 07/08/2021), Disp: 360 mL, Rfl: 4   Magnesium 500 MG CAPS, Take 1 capsule by mouth daily., Disp: , Rfl:    metFORMIN (GLUCOPHAGE) 500 MG tablet, Take 500 mg by mouth 2 (two) times daily.,  Disp: , Rfl:    OneTouch Delica Lancets 30G 02RC, 1 each by Does not apply route See admin instructions. Use OneTouch Delica lancets to check blood sugar 2-3 times daily., Disp: 100 each, Rfl: 3   pantoprazole (PROTONIX) 40 MG tablet, Take 40 mg by mouth 2 (two) times daily., Disp: , Rfl:    polyethylene glycol (MIRALAX / GLYCOLAX) 17 g packet, Take 17 g by mouth daily., Disp: , Rfl:    rosuvastatin (CRESTOR) 10 MG tablet, Take 10 mg by mouth daily., Disp: , Rfl:    sacubitril-valsartan (ENTRESTO) 49-51 MG, Take 1 tablet by mouth 2 (two) times daily., Disp: , Rfl:    sitaGLIPtin (JANUVIA) 100 MG tablet, Take 1 tablet (100 mg total) by mouth daily. (Patient taking differently: Take 50 mg by mouth daily.), Disp: 90 tablet, Rfl: 2   SLOW FE 142 (45 Fe) MG TBCR, Take 1 tablet by mouth daily., Disp: , Rfl:    spironolactone (ALDACTONE) 25 MG tablet, Take 25 mg by mouth daily., Disp: , Rfl:    XARELTO 15 MG TABS tablet, Take 15 mg by mouth daily., Disp: , Rfl:   Past Medical History: Past Medical History:  Diagnosis Date   A-fib (HCC)[place] failed amio due to Gi side effects (in [place] CAD (coronary artery disease)    non-obstructive CAD on cath January 2012. mLAD 40-50%   Chest pain  CHF (congestive heart failure) (HCC)    due to recurrent NICM echo 06/2010: EF 20-25% mod-severe MR, moderate TR   Diabetes mellitus    GI bleed    Hyperlipidemia    Hypertension    S/P atrioventricular nodal ablation     Tobacco Use: Social History   Tobacco Use  Smoking Status Never  Smokeless Tobacco Never    Labs: Recent Review Flowsheet Data     Labs for ITP Cardiac and Pulmonary Rehab Latest Ref Rng & Units 09/11/2013 10/18/2016 06/19/2019 08/14/2019 03/08/2021   LDLCALC - - - 104 - 45   Hemoglobin A1c - 7.4(H) 6.7(H) 6.7 7.7 6.8   PHART 7.350 - 7.400 - - - - -   PCO2ART 35.0 - 45.0 mmHg - - - - -   HCO3 20.0 - 24.0 mEq/L - - - - -   TCO2 0 - 100 mmol/L - - - - -   ACIDBASEDEF 0.0 - 2.0  mmol/L - - - - -   O2SAT % - - - - -        Exercise Target Goals: Exercise Program Goal: Individual exercise prescription set using results from initial 6 min walk test and THRR while considering  patients activity barriers and safety.   Exercise Prescription Goal: Initial exercise prescription builds to 30-45 minutes a day of aerobic activity, 2-3 days per week.  Home exercise guidelines will be given to patient during program as part of exercise prescription that the participant will acknowledge.   Education: Aerobic Exercise: - Group verbal and visual presentation on the components of exercise prescription. Introduces F.I.T.T principle from ACSM for exercise prescriptions.  Reviews F.I.T.T. principles of aerobic exercise including progression. Written material given at graduation.   Education: Resistance Exercise: - Group verbal and visual presentation on the components of exercise prescription. Introduces F.I.T.T principle from ACSM for exercise prescriptions  Reviews F.I.T.T. principles of resistance exercise including progression. Written material given at graduation.    Education: Exercise & Equipment Safety: - Individual verbal instruction and demonstration of equipment use and safety with use of the equipment. Flowsheet Row Cardiac Rehab from 06/02/2021 in [facility] Cardiac and Pulmonary Rehab  Date 05/06/21  Educator AS  Instruction Review Code 1- Verbalizes Understanding       Education: Exercise Physiology & General Exercise Guidelines: - Group verbal and written instruction with models to review the exercise physiology of the cardiovascular system and associated critical values. Provides general exercise guidelines with specific guidelines to those with heart or lung disease.    Education: Flexibility, Balance, Mind/Body Relaxation: - Group verbal and visual presentation with interactive activity on the components of exercise prescription. Introduces F.I.T.T principle  from ACSM for exercise prescriptions. Reviews F.I.T.T. principles of flexibility and balance exercise training including progression. Also discusses the mind body connection.  Reviews various relaxation techniques to help reduce and manage stress (i.e. Deep breathing, progressive muscle relaxation, and visualization). Balance handout provided to take home. Written material given at graduation.   Activity Barriers & Risk Stratification:  Activity Barriers & Cardiac Risk Stratification - 04/23/21 1346       Activity Barriers & Cardiac Risk Stratification   Activity Barriers Joint Problems;Other (comment);Balance Concerns    Comments left knee    Cardiac Risk Stratification High             6 Minute Walk:  6 Minute Walk     Row Name 05/06/21 1449         6 Minute Walk  Distance 500 feet     Walk Time 5 minutes     # of Rest Breaks 1     MPH 1.1     METS 0.13     RPE 13     Perceived Dyspnea  1     VO2 Peak 0.47     Symptoms No     Resting HR 83 bpm     Resting BP 98/64     Resting Oxygen Saturation  99 %     Exercise Oxygen Saturation  during 6 min walk 100 %     Max Ex. HR 90 bpm     Max Ex. BP 98/62     2 Minute Post BP 96/58              Oxygen Initial Assessment:   Oxygen Re-Evaluation:   Oxygen Discharge (Final Oxygen Re-Evaluation):   Initial Exercise Prescription:  Initial Exercise Prescription - 05/06/21 1500       Date of Initial Exercise RX and Referring Provider   Date 05/06/21    Referring Provider Neoma Laming      Oxygen   Maintain Oxygen Saturation 88% or higher      Treadmill   MPH 1    Grade 0    Minutes 15    METs 1      NuStep   Level 1    SPM 80    Minutes 15    METs 1      REL-XR   Level 1    Speed 50    Minutes 15    METs 1      Biostep-RELP   Level 1    SPM 50    Minutes 15    METs 1      Track   Laps 5    Minutes 15    METs 1      Prescription Details   Frequency (times per week) 3    Duration  Progress to 30 minutes of continuous aerobic without signs/symptoms of physical distress      Intensity   THRR 40-80% of Max Heartrate 96-121    Ratings of Perceived Exertion 11-13    Perceived Dyspnea 0-4      Resistance Training   Training Prescription Yes    Weight 2 lb    Reps 10-15             Perform Capillary Blood Glucose checks as needed.  Exercise Prescription Changes:   Exercise Prescription Changes     Row Name 05/06/21 1400 05/12/21 1500 05/24/21 1300 06/08/21 1500       Response to Exercise   Blood Pressure (Admit) 98/64 102/64 88/50 92/56     Blood Pressure (Exercise) 98/62 110/60 98/56 96/54     Blood Pressure (Exit) 96/58 100/58 94/58 95/68     Heart Rate (Admit) 83 bpm 70 bpm 67 bpm 69 bpm    Heart Rate (Exercise) 90 bpm 84 bpm 95 bpm 84 bpm    Heart Rate (Exit) 75 bpm 80 bpm 75 bpm 74 bpm    Oxygen Saturation (Admit) 99 % -- -- --    Oxygen Saturation (Exercise) 100 % -- -- --    Rating of Perceived Exertion (Exercise) 13 13 15 14     Perceived Dyspnea (Exercise) 1 -- -- --    Symptoms -- none none none    Comments -- first day -- --    Duration -- Progress to 30 minutes of  aerobic without  signs/symptoms of physical distress Continue with 30 min of aerobic exercise without signs/symptoms of physical distress. Continue with 30 min of aerobic exercise without signs/symptoms of physical distress.    Intensity -- THRR unchanged THRR unchanged THRR unchanged      Progression   Progression -- Continue to progress workloads to maintain intensity without signs/symptoms of physical distress. Continue to progress workloads to maintain intensity without signs/symptoms of physical distress. Continue to progress workloads to maintain intensity without signs/symptoms of physical distress.    Average METs -- 2 1.48 1.7      Resistance Training   Training Prescription -- Yes Yes Yes    Weight -- 2 lb 2 lb 2 lb    Reps -- 10-15 10-15 10-15      Interval Training    Interval Training -- -- No No      NuStep   Level -- -- 1 2    Minutes -- -- 15 15    METs -- -- 1.7 1.8      REL-XR   Level -- 1 1 1     Minutes -- 15 15 15     METs -- -- 1 1.4      Biostep-RELP   Level -- -- 1 --    Minutes -- -- 15 --    METs -- -- 1.5 --      Track   Laps -- 5 13 18     Minutes -- 15 15 15     METs -- -- 1.71 1.98      Home Exercise Plan   Plans to continue exercise at -- -- -- Home (comment)  walking and staff videos    Frequency -- -- -- Add 2 additional days to program exercise sessions.    Initial Home Exercises Provided -- -- -- 05/31/21      Oxygen   Maintain Oxygen Saturation -- -- 88% or higher 88% or higher             Exercise Comments:   Exercise Comments     Row Name 05/10/21 1605           Exercise Comments First full day of exercise!  Patient was oriented to gym and equipment including functions, settings, policies, and procedures.  Patient's individual exercise prescription and treatment plan were reviewed.  All starting workloads were established based on the results of the 6 minute walk test done at initial orientation visit.  The plan for exercise progression was also introduced and progression will be customized based on patient's performance and goals.                Exercise Goals and Review:   Exercise Goals     Row Name 05/06/21 1453             Exercise Goals   Increase Physical Activity Yes       Intervention Provide advice, education, support and counseling about physical activity/exercise needs.;Develop an individualized exercise prescription for aerobic and resistive training based on initial evaluation findings, risk stratification, comorbidities and participant's personal goals.       Expected Outcomes Short Term: Attend rehab on a regular basis to increase amount of physical activity.;Long Term: Add in home exercise to make exercise part of routine and to increase amount of physical activity.;Long Term:  Exercising regularly at least 3-5 days a week.       Increase Strength and Stamina Yes       Intervention Provide advice, education, support and  counseling about physical activity/exercise needs.;Develop an individualized exercise prescription for aerobic and resistive training based on initial evaluation findings, risk stratification, comorbidities and participant's personal goals.       Expected Outcomes Short Term: Increase workloads from initial exercise prescription for resistance, speed, and METs.;Short Term: Perform resistance training exercises routinely during rehab and add in resistance training at home;Long Term: Improve cardiorespiratory fitness, muscular endurance and strength as measured by increased METs and functional capacity (6MWT)       Able to understand and use rate of perceived exertion (RPE) scale Yes       Intervention Provide education and explanation on how to use RPE scale       Expected Outcomes Short Term: Able to use RPE daily in rehab to express subjective intensity level;Long Term:  Able to use RPE to guide intensity level when exercising independently       Able to understand and use Dyspnea scale Yes       Intervention Provide education and explanation on how to use Dyspnea scale       Expected Outcomes Short Term: Able to use Dyspnea scale daily in rehab to express subjective sense of shortness of breath during exertion;Long Term: Able to use Dyspnea scale to guide intensity level when exercising independently       Knowledge and understanding of Target Heart Rate Range (THRR) Yes       Intervention Provide education and explanation of THRR including how the numbers were predicted and where they are located for reference       Expected Outcomes Short Term: Able to state/look up THRR;Short Term: Able to use daily as guideline for intensity in rehab;Long Term: Able to use THRR to govern intensity when exercising independently       Able to check pulse independently Yes        Intervention Provide education and demonstration on how to check pulse in carotid and radial arteries.;Review the importance of being able to check your own pulse for safety during independent exercise       Expected Outcomes Short Term: Able to explain why pulse checking is important during independent exercise;Long Term: Able to check pulse independently and accurately       Understanding of Exercise Prescription Yes       Intervention Provide education, explanation, and written materials on patient's individual exercise prescription       Expected Outcomes Short Term: Able to explain program exercise prescription;Long Term: Able to explain home exercise prescription to exercise independently                Exercise Goals Re-Evaluation :  Exercise Goals Re-Evaluation     Row Name 05/10/21 1605 05/24/21 1336 05/31/21 1610 06/08/21 1534 06/23/21 1017     Exercise Goal Re-Evaluation   Exercise Goals Review Increase Physical Activity;Able to understand and use rate of perceived exertion (RPE) scale;Knowledge and understanding of Target Heart Rate Range (THRR);Understanding of Exercise Prescription;Increase Strength and Stamina;Able to understand and use Dyspnea scale;Able to check pulse independently Increase Physical Activity;Increase Strength and Stamina;Understanding of Exercise Prescription Increase Physical Activity;Increase Strength and Stamina;Able to understand and use rate of perceived exertion (RPE) scale;Knowledge and understanding of Target Heart Rate Range (THRR);Able to check pulse independently Increase Physical Activity;Increase Strength and Stamina Increase Physical Activity;Increase Strength and Stamina   Comments Reviewed RPE and dyspnea scales, THR and program prescription with pt today.  Pt voiced understanding and was given a copy of goals to take home. April Park is off to a good start in rehab.  She has completed her first four full days of exercise.  We will continue to  monitor her progress. Reviewed home exercise with pt today.  Pt plans to walk and use staff videos for exercise.  Reviewed THR, pulse, RPE, sign and symptoms, pulse oximetery and when to call 911 or MD.  Also discussed weather considerations and indoor options.  Pt voiced understanding. April Park continues to do well in rehab. She did reach up to 18 laps on the track and staff will continue to encourage her to keep meeting that, if not more each time. She did move to up level 2 on the T4 which she tolerates well. The XR is most difficult for her to maintain her speed and will continue to monitor her workloads as she progresses. April Park has not attended rehab since last review. She has been admitted in the hospital and staff will check on updates periodically.   Expected Outcomes Short: Use RPE daily to regulate intensity. Long: Follow program prescription in THR. Short: Continue to attend rehab regularly Long: Continue to follow program prescription Short: monitor HR when exrecising at home Long:  improve overall stamina Short: Work up tolerance on the XR Long: Increase overall MET level Short: Start HeartTrack when cleared and appropriate to do so Long: Continue to build up overall strength, stamina, and MET level    Row Name 07/20/21 1607             Exercise Goal Re-Evaluation   Comments Out since Park/2022.                Discharge Exercise Prescription (Final Exercise Prescription Changes):  Exercise Prescription Changes - 06/08/21 1500       Response to Exercise   Blood Pressure (Admit) 92/56    Blood Pressure (Exercise) 96/54    Blood Pressure (Exit) 95/68    Heart Rate (Admit) 69 bpm    Heart Rate (Exercise) 84 bpm    Heart Rate (Exit) 74 bpm    Rating of Perceived Exertion (Exercise) 14    Symptoms none    Duration Continue with 30 min of aerobic exercise without signs/symptoms of physical distress.    Intensity THRR unchanged      Progression   Progression Continue to  progress workloads to maintain intensity without signs/symptoms of physical distress.    Average METs 1.7      Resistance Training   Training Prescription Yes    Weight 2 lb    Reps 10-15      Interval Training   Interval Training No      NuStep   Level 2    Minutes 15    METs 1.8      REL-XR   Level 1    Minutes 15    METs 1.4      Track   Laps 18    Minutes 15    METs 1.98      Home Exercise Plan   Plans to continue exercise at Home (comment)   walking and staff videos   Frequency Add 2 additional days to program exercise sessions.    Initial Home Exercises Provided 05/31/21      Oxygen   Maintain Oxygen Saturation 88% or higher             Nutrition:  Target Goals: Understanding of nutrition guidelines, daily intake of sodium <1521m, cholesterol <2052m calories 30% from fat and 7% or less from  saturated fats, daily to have 5 or more servings of fruits and vegetables.  Education: All About Nutrition: -Group instruction provided by verbal, written material, interactive activities, discussions, models, and posters to present general guidelines for heart healthy nutrition including fat, fiber, MyPlate, the role of sodium in heart healthy nutrition, utilization of the nutrition label, and utilization of this knowledge for meal planning. Follow up email sent as well. Written material given at graduation.   Biometrics:  Pre Biometrics - 05/06/21 1454       Pre Biometrics   Height 5' 1"  (1.549 m)    Weight 146 lb (66.2 kg)    BMI (Calculated) 27.6              Nutrition Therapy Plan and Nutrition Goals:  Nutrition Therapy & Goals - 05/31/21 1647       Nutrition Therapy   RD appointment deferred Yes      Personal Nutrition Goals   Nutrition Goal No goals, declined services.    Comments Past office visits document lowered appetite and chronic constipation.  A1C 6.8.  MD note 05/18/21 GERD, GI bleeding noted 04/01/21, iron deficiency due to roundworms  --> bloodwork looks normal at the time of appointment, but hemoccult positive. MD note 04/07/21 mentions previous significant weight loss, but does not specify how much or when. Note also reports she has gained some weight back. Intake from MD note records breakfast as an egg with piece of bread, lunch as a "mixed meal", dinner as roti with meat or lentils, and snacks are usually fruit. Relevant medications: aldactone, lasix, iron, magnesium, miralax, crestor, januvia, farxiga, metformin, amaryl, protonix. Printed out recipes that were from [place] which were high in protein and featured good sources of fiber. Mr. Miranda reported that he didn't feel the RD services would help as American food is different from food from [place] and they know how to get enough protein; discussed how the consultation would be focused on the food that they do eat and suggestions based off of that as well as addressing any other health concerns during the process. Both Mr. and April Park declined RD services.             Nutrition Assessments:  MEDIFICTS Score Key: ?70 Need to make dietary changes  40-70 Heart Healthy Diet ? 40 Therapeutic Level Cholesterol Diet   Picture Your Plate Scores: <42 Unhealthy dietary pattern with much room for improvement. 41-50 Dietary pattern unlikely to meet recommendations for good health and room for improvement. 51-60 More healthful dietary pattern, with some room for improvement.  >60 Healthy dietary pattern, although there may be some specific behaviors that could be improved.    Nutrition Goals Re-Evaluation:   Nutrition Goals Discharge (Final Nutrition Goals Re-Evaluation):   Psychosocial: Target Goals: Acknowledge presence or absence of significant depression and/or stress, maximize coping skills, provide positive support system. Participant is able to verbalize types and ability to use techniques and skills needed for reducing stress and depression.   Education:  Stress, Anxiety, and Depression - Group verbal and visual presentation to define topics covered.  Reviews how body is impacted by stress, anxiety, and depression.  Also discusses healthy ways to reduce stress and to treat/manage anxiety and depression.  Written material given at graduation. Flowsheet Row Cardiac Rehab from 06/02/2021 in [facility] Cardiac and Pulmonary Rehab  Date 06/02/21  Educator AS  Instruction Review Code 1- Verbalizes Understanding       Education: Sleep Hygiene -Provides group verbal and written  instruction about how sleep can affect your health.  Define sleep hygiene, discuss sleep cycles and impact of sleep habits. Review good sleep hygiene tips.    Initial Review & Psychosocial Screening:  Initial Psych Review & Screening - 04/23/21 1401       Initial Review   Current issues with Current Sleep Concerns;Current Stress Concerns    Source of Stress Concerns Chronic Illness    Comments Returning home to [place] soon      [place]? Yes   husband, 3 sons and 1 daughter plus their families     Barriers   Psychosocial barriers to participate in program The patient should benefit from training in stress management and relaxation.      Screening Interventions   Interventions Encouraged to exercise;Provide feedback about the scores to participant;To provide support and resources with identified psychosocial needs    Expected Outcomes Short Term goal: Utilizing psychosocial counselor, staff and physician to assist with identification of specific Stressors or current issues interfering with healing process. Setting desired goal for each stressor or current issue identified.;Long Term Goal: Stressors or current issues are controlled or eliminated.;Short Term goal: Identification and review with participant of any Quality of Life or Depression concerns found by scoring the questionnaire.;Long Term goal: The participant improves quality of Life and  PHQ9 Scores as seen by post scores and/or verbalization of changes             Quality of Life Scores:   Quality of Life - 04/23/21 1537       Quality of Life   Select Quality of Life      Quality of Life Scores   Health/Function Pre 18.54 %    Socioeconomic Pre 19.33 %    Psych/Spiritual Pre 24.83 %    Family Pre 30 %    GLOBAL Pre 21.76 %            Scores of 19 and below usually indicate a poorer quality of life in these areas.  A difference of  2-3 points is a clinically meaningful difference.  A difference of 2-3 points in the total score of the Quality of Life Index has been associated with significant improvement in overall quality of life, self-image, physical symptoms, and general health in studies assessing change in quality of life.  PHQ-9: Recent Review Flowsheet Data     Depression screen [facility] 2/9 04/23/2021   Decreased Interest 2   Down, Depressed, Hopeless 0   PHQ - 2 Score 2   Altered sleeping 0   Tired, decreased energy 2   Change in appetite 0   Feeling bad or failure about yourself  0   Trouble concentrating 0   Moving slowly or fidgety/restless 0   Suicidal thoughts 0   PHQ-9 Score 4   Difficult doing work/chores Not difficult at all      Interpretation of Total Score  Total Score Depression Severity:  1-4 = Minimal depression, 5-9 = Mild depression, 10-14 = Moderate depression, 15-19 = Moderately severe depression, 20-27 = Severe depression   Psychosocial Evaluation and Intervention:  Psychosocial Evaluation - 04/23/21 1530       Psychosocial Evaluation & Interventions   Interventions Encouraged to exercise with the program and follow exercise prescription;Stress management education    Comments April Park is coming to cardiac rehab for systolic heart failure. Her husband completed the program a while back and is encouraging her to participate, so are her 3  sons and 1 daughter who are physicians. She has been feeling weak, especially after  her ICD placement a few weeks ago. The discomfort post ICD is causing current sleep concerns whenever she moves or Park over. They were here visiting their children since August with a hope of returning home to [place] the end of November which now will be pushed back so she can participate in the program. Her biggest stressor is her lack of energy and stamina which she hopes to work on some while here. She mentioned being tired of all the medication she is on, but is willing to try whatever it takes to feel better.    Expected Outcomes Short: attend Cardiac Rehab for education and exercise. Long: develop and maintain positive self care habits.    Continue Psychosocial Services  Follow up required by staff             Psychosocial Re-Evaluation:  Psychosocial Re-Evaluation     [place] Name 05/31/21 1604             Psychosocial Re-Evaluation   Current issues with Current Stress Concerns;Current Sleep Concerns       Comments April Park says she sometimes sleeps better.  She reports no symptoms of depression or anxiety.       Expected Outcomes Short: continue to attend consistently to help with stress Long:  maintain positive outlook                Psychosocial Discharge (Final Psychosocial Re-Evaluation):  Psychosocial Re-Evaluation - 05/31/21 1604       Psychosocial Re-Evaluation   Current issues with Current Stress Concerns;Current Sleep Concerns    Comments April Park says she sometimes sleeps better.  She reports no symptoms of depression or anxiety.    Expected Outcomes Short: continue to attend consistently to help with stress Long:  maintain positive outlook             Vocational Rehabilitation: Provide vocational rehab assistance to qualifying candidates.   Vocational Rehab Evaluation & Intervention:  Vocational Rehab - 04/23/21 1410       Initial Vocational Rehab Evaluation & Intervention   Assessment shows need for Vocational Rehabilitation No              Education: Education Goals: Education classes will be provided on a variety of topics geared toward better understanding of heart health and risk factor modification. Participant will state understanding/return demonstration of topics presented as noted by education test scores.  Learning Barriers/Preferences:  Learning Barriers/Preferences - 04/23/21 1410       Learning Barriers/Preferences   Learning Barriers Language   understands some English, Husband to accompany her   Learning Preferences Individual Instruction             General Cardiac Education Topics:  AED/CPR: - Group verbal and written instruction with the use of models to demonstrate the basic use of the AED with the basic ABC's of resuscitation.   Anatomy and Cardiac Procedures: - Group verbal and visual presentation and models provide information about basic cardiac anatomy and function. Reviews the testing methods done to diagnose heart disease and the outcomes of the test results. Describes the treatment choices: Medical Management, Angioplasty, or Coronary Bypass Surgery for treating various heart conditions including Myocardial Infarction, Angina, Valve Disease, and Cardiac Arrhythmias.  Written material given at graduation.   Medication Safety: - Group verbal and visual instruction to review commonly prescribed medications for heart and lung disease. Reviews the medication, class of the  drug, and side effects. Includes the steps to properly store meds and maintain the prescription regimen.  Written material given at graduation.   Intimacy: - Group verbal instruction through game format to discuss how heart and lung disease can affect sexual intimacy. Written material given at graduation..   Know Your Numbers and Heart Failure: - Group verbal and visual instruction to discuss disease risk factors for cardiac and pulmonary disease and treatment options.  Reviews associated critical values for  Overweight/Obesity, Hypertension, Cholesterol, and Diabetes.  Discusses basics of heart failure: signs/symptoms and treatments.  Introduces Heart Failure Zone chart for action plan for heart failure.  Written material given at graduation.   Infection Prevention: - Provides verbal and written material to individual with discussion of infection control including proper hand washing and proper equipment cleaning during exercise session. Flowsheet Row Cardiac Rehab from 06/02/2021 in [facility] Cardiac and Pulmonary Rehab  Date 05/06/21  Educator AS  Instruction Review Code 1- Verbalizes Understanding       Falls Prevention: - Provides verbal and written material to individual with discussion of falls prevention and safety. Flowsheet Row Cardiac Rehab from 06/02/2021 in [facility] Cardiac and Pulmonary Rehab  Date 05/06/21  Educator AS  Instruction Review Code 1- Verbalizes Understanding       Other: -Provides group and verbal instruction on various topics (see comments)   Knowledge Questionnaire Score:   Core Components/Risk Factors/Patient Goals at Admission:  Personal Goals and Risk Factors at Admission - 05/06/21 1454       Core Components/Risk Factors/Patient Goals on Admission    Weight Management Yes    Intervention Weight Management: Develop a combined nutrition and exercise program designed to reach desired caloric intake, while maintaining appropriate intake of nutrient and fiber, sodium and fats, and appropriate energy expenditure required for the weight goal.;Weight Management: Provide education and appropriate resources to help participant work on and attain dietary goals.    Admit Weight 146 lb (66.2 kg)    Goal Weight: Short Term 146 lb (66.2 kg)    Goal Weight: Long Term 146 lb (66.2 kg)    Expected Outcomes Short Term: Continue to assess and modify interventions until short term weight is achieved;Long Term: Adherence to nutrition and physical activity/exercise program aimed  toward attainment of established weight goal;Weight Maintenance: Understanding of the daily nutrition guidelines, which includes 25-35% calories from fat, 7% or less cal from saturated fats, less than 212m cholesterol, less than 1.5gm of sodium, & 5 or more servings of fruits and vegetables daily;Understanding recommendations for meals to include 15-35% energy as protein, 25-35% energy from fat, 35-60% energy from carbohydrates, less than 2035mof dietary cholesterol, 20-35 gm of total fiber daily;Understanding of distribution of calorie intake throughout the day with the consumption of 4-5 meals/snacks    Heart Failure Yes    Intervention Provide a combined exercise and nutrition program that is supplemented with education, support and counseling about heart failure. Directed toward relieving symptoms such as shortness of breath, decreased exercise tolerance, and extremity edema.    Expected Outcomes Improve functional capacity of life;Short term: Attendance in program 2-3 days a week with increased exercise capacity. Reported lower sodium intake. Reported increased fruit and vegetable intake. Reports medication compliance.;Short term: Daily weights obtained and reported for increase. Utilizing diuretic protocols set by physician.;Long term: Adoption of self-care skills and reduction of barriers for early signs and symptoms recognition and intervention leading to self-care maintenance.    Hypertension Yes    Intervention Provide education  on lifestyle modifcations including regular physical activity/exercise, weight management, moderate sodium restriction and increased consumption of fresh fruit, vegetables, and low fat dairy, alcohol moderation, and smoking cessation.;Monitor prescription use compliance.    Expected Outcomes Short Term: Continued assessment and intervention until BP is < 140/90m HG in hypertensive participants. < 130/871mHG in hypertensive participants with diabetes, heart failure or  chronic kidney disease.;Long Term: Maintenance of blood pressure at goal levels.    Lipids Yes    Intervention Provide education and support for participant on nutrition & aerobic/resistive exercise along with prescribed medications to achieve LDL <705mHDL >44m60m  Expected Outcomes Short Term: Participant states understanding of desired cholesterol values and is compliant with medications prescribed. Participant is following exercise prescription and nutrition guidelines.;Long Term: Cholesterol controlled with medications as prescribed, with individualized exercise RX and with personalized nutrition plan. Value goals: LDL < 70mg20mL > 40 mg.             Education:Diabetes - Individual verbal and written instruction to review signs/symptoms of diabetes, desired ranges of glucose level fasting, after meals and with exercise. Acknowledge that pre and post exercise glucose checks will be done for 3 sessions at entry of program. [facility]  Date 04/23/21  Educator MC  [facility] Review Code 1- Verbalizes Understanding       Core Components/Risk Factors/Patient Goals Review:   Goals and Risk Factor Review     Row Name 05/31/21 1601             Core Components/Risk Factors/Patient Goals Review   Personal Goals Review Weight Management/Obesity;Heart Failure;Hypertension       Review April Park not currently her weight at home.  She says her Dr wants her to monitor weight and she has a scale.  We reviewed the importance of keeping track of weight/fluid.  She says she gets SOB when she gets dressed.  She has not seen improvement yet with exercise.  Her husband checks her BP at home.       Expected Outcomes Short:  start to weight at home Long:  Manage risk factors long term                Core Components/Risk Factors/Patient Goals at Discharge (Final Review):   Goals and Risk Factor Review - 05/31/21 1601        Core Components/Risk Factors/Patient Goals Review   Personal Goals Review Weight Management/Obesity;Heart Failure;Hypertension    Review April Park not currently her weight at home.  She says her Dr wants her to monitor weight and she has a scale.  We reviewed the importance of keeping track of weight/fluid.  She says she gets SOB when she gets dressed.  She has not seen improvement yet with exercise.  Her husband checks her BP at home.    Expected Outcomes Short:  start to weight at home Long:  Manage risk factors long term             ITP Comments:  ITP Comments     Row Name 04/23/21 1506 05/06/21 1511 05/10/21 1605 05/19/21 0713 05/31/21 1645   ITP Comments Initial orientation completed. Diagnosis can be found in Media Tab- Dr. Hyams'Laurelyn Sicklece note 11/1. EP orientation scheduled for Thursday 11/17 at 2pm. Completed 6MWT and gym orientation. Initial ITP created and sent for review to Dr. Mark Emily Filbertical Director. First full day of exercise!  Patient was oriented to gym  and equipment including functions, settings, policies, and procedures.  Patient's individual exercise prescription and treatment plan were reviewed.  All starting workloads were established based on the results of the 6 minute walk test done at initial orientation visit.  The plan for exercise progression was also introduced and progression will be customized based on patient's performance and goals. 30 Day review completed. Medical Director ITP review done, changes made as directed, and signed approval by Medical Director.    New to program Spoke briefly regarding nutrition as both Mr. and Mrs. Tomczak no longer wanted to pursue the nutrition appointment.    [place] Name 06/16/21 0733 06/23/21 1019 07/08/21 1536 07/14/21 0901 07/20/21 1606   ITP Comments 30 Day review completed. Medical Director ITP review done, changes made as directed, and signed approval by Medical Director. April Park. She has  been admitted in the hospital and staff will check on updates periodically. Attempted to call patient. Need clearance to come back and have not had response back from patient. Last attended Park. Will send letter. 30 Day review completed. Medical Director ITP review done, changes made as directed, and signed approval by Medical Director.   no visits since last review April Park has not attended since Park/22.  Letter was sent for d/c on 07/22/20.    [place] Name 07/22/21 1538           ITP Comments No response back from patient. Will discharge at this time.                Comments: Discharge ITP

## 2021-07-22 NOTE — Progress Notes (Signed)
Discharge Progress Report  Patient Details  Name: April Park MRN: [id number] Date of Birth: 1932-01-01 Referring Provider:   Flowsheet Row Cardiac Rehab from 05/06/2021 in [facility] Cardiac and Pulmonary Rehab  Referring Provider April Park        Number of Visits: 12  Reason for Discharge:  Early Exit:  Lack of attendance and Medical  Smoking History:  Social History   Tobacco Use  Smoking Status Never  Smokeless Tobacco Never    Diagnosis:  Heart failure, chronic systolic ([place])  ADL UCSD:   Initial Exercise Prescription:  Initial Exercise Prescription - 05/06/21 1500       Date of Initial Exercise RX and Referring Provider   Date 05/06/21    Referring Provider April Park      Oxygen   Maintain Oxygen Saturation 88% or higher      Treadmill   MPH 1    Grade 0    Minutes 15    METs 1      NuStep   Level 1    SPM 80    Minutes 15    METs 1      REL-XR   Level 1    Speed 50    Minutes 15    METs 1      Biostep-RELP   Level 1    SPM 50    Minutes 15    METs 1      Track   Laps 5    Minutes 15    METs 1      Prescription Details   Frequency (times per week) 3    Duration Progress to 30 minutes of continuous aerobic without signs/symptoms of physical distress      Intensity   THRR 40-80% of Max Heartrate 96-121    Ratings of Perceived Exertion 11-13    Perceived Dyspnea 0-4      Resistance Training   Training Prescription Yes    Weight 2 lb    Reps 10-15             Discharge Exercise Prescription (Final Exercise Prescription Changes):  Exercise Prescription Changes - 06/08/21 1500       Response to Exercise   Blood Pressure (Admit) 92/56    Blood Pressure (Exercise) 96/54    Blood Pressure (Exit) 95/68    Heart Rate (Admit) 69 bpm    Heart Rate (Exercise) 84 bpm    Heart Rate (Exit) 74 bpm    Rating of Perceived Exertion (Exercise) 14    Symptoms none    Duration Continue with 30 min of aerobic exercise without  signs/symptoms of physical distress.    Intensity THRR unchanged      Progression   Progression Continue to progress workloads to maintain intensity without signs/symptoms of physical distress.    Average METs 1.7      Resistance Training   Training Prescription Yes    Weight 2 lb    Reps 10-15      Interval Training   Interval Training No      NuStep   Level 2    Minutes 15    METs 1.8      REL-XR   Level 1    Minutes 15    METs 1.4      Track   Laps 18    Minutes 15    METs 1.98      Home Exercise Plan   Plans to continue exercise at Home (comment)  walking and staff videos   Frequency Add 2 additional days to program exercise sessions.    Initial Home Exercises Provided 05/31/21      Oxygen   Maintain Oxygen Saturation 88% or higher             Functional Capacity:  6 Minute Walk     Row Name 05/06/21 1449         6 Minute Walk   Distance 500 feet     Walk Time 5 minutes     # of Rest Breaks 1     MPH 1.1     METS 0.13     RPE 13     Perceived Dyspnea  1     VO2 Peak 0.47     Symptoms No     Resting HR 83 bpm     Resting BP 98/64     Resting Oxygen Saturation  99 %     Exercise Oxygen Saturation  during 6 min walk 100 %     Max Ex. HR 90 bpm     Max Ex. BP 98/62     2 Minute Post BP 96/58                Nutrition & Weight - Outcomes:  Pre Biometrics - 05/06/21 1454       Pre Biometrics   Height 5\' 1"  (1.549 m)    Weight 146 lb (66.2 kg)    BMI (Calculated) 27.6              Nutrition:  Nutrition Therapy & Goals - 05/31/21 1647       Nutrition Therapy   RD appointment deferred Yes      Personal Nutrition Goals   Nutrition Goal No goals, declined services.    Comments Past office visits document lowered appetite and chronic constipation.  A1C 6.8.  MD note 05/18/21 GERD, GI bleeding noted 04/01/21, iron deficiency due to roundworms --> bloodwork looks normal at the time of appointment, but hemoccult positive. MD  note 04/07/21 mentions previous significant weight loss, but does not specify how much or when. Note also reports she has gained some weight back. Intake from MD note records breakfast as an egg with piece of bread, lunch as a "mixed meal", dinner as roti with meat or lentils, and snacks are usually fruit. Relevant medications: aldactone, lasix, iron, magnesium, miralax, crestor, januvia, farxiga, metformin, amaryl, protonix. Printed out recipes that were from [place] which were high in protein and featured good sources of fiber. April Park reported that he didn't feel the RD services would help as American food is different from food from [place] and they know how to get enough protein; discussed how the consultation would be focused on the food that they do eat and suggestions based off of that as well as addressing any other health concerns during the process. Both Mr. and April Park declined RD services.              Goals reviewed with patient; copy given to patient.

## 2021-08-03 ENCOUNTER — Other Ambulatory Visit (HOSPITAL_COMMUNITY): Payer: Self-pay | Admitting: Hematology and Oncology

## 2021-08-03 ENCOUNTER — Encounter (HOSPITAL_COMMUNITY): Payer: Self-pay | Admitting: Radiology

## 2021-08-03 DIAGNOSIS — K769 Liver disease, unspecified: Secondary | ICD-10-CM

## 2021-08-03 NOTE — Progress Notes (Signed)
Patient Name  April Park, April Park Legal Sex  Female DOB  1931/08/17 SSN  [id number] Address  [place] [address] Phone  [phone] [facility])  [phone] (Mobile) *Preferred*   Liver biopsy Received: Today Aletta Edouard, MD  Marnee Guarneri Dr. Marcy Panning, oncologist in [facility] is going to be ordering a liver biopsy on her mother (patient listed) for me to do. Approved for an US liver lesion biopsy. I would like to do it at [facility] on Fri, 2/17, as the floater IR there. I can do it any time there, but prefer a morning or early afternoon time since she is a liver.   Thanks,   GY

## 2021-08-05 ENCOUNTER — Other Ambulatory Visit (HOSPITAL_COMMUNITY): Payer: Self-pay | Admitting: Physician Assistant

## 2021-08-06 ENCOUNTER — Ambulatory Visit (HOSPITAL_COMMUNITY)
Admission: RE | Admit: 2021-08-06 | Discharge: 2021-08-06 | Disposition: A | Discharge status: Home / Self Care | Payer: Medicare Other | Source: Ambulatory Visit | Attending: Hematology and Oncology | Admitting: Hematology and Oncology

## 2021-08-06 ENCOUNTER — Other Ambulatory Visit: Payer: Self-pay

## 2021-08-06 ENCOUNTER — Other Ambulatory Visit (HOSPITAL_COMMUNITY): Payer: Self-pay | Admitting: Physician Assistant

## 2021-08-06 DIAGNOSIS — E119 Type 2 diabetes mellitus without complications: Secondary | ICD-10-CM | POA: Diagnosis not present

## 2021-08-06 DIAGNOSIS — Z95810 Presence of automatic (implantable) cardiac defibrillator: Secondary | ICD-10-CM | POA: Diagnosis not present

## 2021-08-06 DIAGNOSIS — I4891 Unspecified atrial fibrillation: Secondary | ICD-10-CM | POA: Diagnosis not present

## 2021-08-06 DIAGNOSIS — K769 Liver disease, unspecified: Secondary | ICD-10-CM | POA: Diagnosis present

## 2021-08-06 DIAGNOSIS — I509 Heart failure, unspecified: Secondary | ICD-10-CM | POA: Insufficient documentation

## 2021-08-06 DIAGNOSIS — I110 Hypertensive heart disease with heart failure: Secondary | ICD-10-CM | POA: Insufficient documentation

## 2021-08-06 DIAGNOSIS — E785 Hyperlipidemia, unspecified: Secondary | ICD-10-CM | POA: Diagnosis not present

## 2021-08-06 DIAGNOSIS — Z794 Long term (current) use of insulin: Secondary | ICD-10-CM | POA: Diagnosis not present

## 2021-08-06 LAB — CBC
HCT: 33.5 % — ABNORMAL LOW (ref 36.0–46.0)
Hemoglobin: 10.6 g/dL — ABNORMAL LOW (ref 12.0–15.0)
MCH: 28.3 pg (ref 26.0–34.0)
MCHC: 31.6 g/dL (ref 30.0–36.0)
MCV: 89.6 fL (ref 80.0–100.0)
Platelets: 115 10*3/uL — ABNORMAL LOW (ref 150–400)
RBC: 3.74 MIL/uL — ABNORMAL LOW (ref 3.87–5.11)
RDW: 21.5 % — ABNORMAL HIGH (ref 11.5–15.5)
WBC: 11.7 10*3/uL — ABNORMAL HIGH (ref 4.0–10.5)
nRBC: 0 % (ref 0.0–0.2)

## 2021-08-06 LAB — PROTIME-INR
INR: 1.1 (ref 0.8–1.2)
Prothrombin Time: 14 seconds (ref 11.4–15.2)

## 2021-08-06 LAB — GLUCOSE, CAPILLARY
Glucose-Capillary: 364 mg/dL — ABNORMAL HIGH (ref 70–99)
Glucose-Capillary: 284 mg/dL — ABNORMAL HIGH (ref 70–99)

## 2021-08-06 MED ORDER — FENTANYL CITRATE (PF) 100 MCG/2ML IJ SOLN
INTRAMUSCULAR | Status: AC
  Filled 2021-08-06: qty 2

## 2021-08-06 MED ORDER — NYSTATIN 100000 UNIT/ML MT SUSP: 5.0000 mL | Freq: Three times a day (TID) | OROMUCOSAL | 0 refills | Status: AC | PRN

## 2021-08-06 MED ORDER — MIDAZOLAM HCL 2 MG/2ML IJ SOLN
INTRAMUSCULAR | Status: AC
  Filled 2021-08-06: qty 2

## 2021-08-06 MED ORDER — FENTANYL CITRATE (PF) 100 MCG/2ML IJ SOLN
INTRAMUSCULAR | Status: DC | PRN
  Administered 2021-08-06: 25 ug via INTRAVENOUS

## 2021-08-06 MED ORDER — GELATIN ABSORBABLE 12-7 MM EX MISC
CUTANEOUS | Status: AC
  Filled 2021-08-06: qty 1

## 2021-08-06 MED ORDER — LIDOCAINE HCL (PF) 1 % IJ SOLN
INTRAMUSCULAR | Status: AC
  Filled 2021-08-06: qty 30

## 2021-08-06 MED ORDER — MIDAZOLAM HCL 2 MG/2ML IJ SOLN
INTRAMUSCULAR | Status: DC | PRN
Start: 2021-08-06 — End: 2021-08-07
  Administered 2021-08-06: 1 mg via INTRAVENOUS

## 2021-08-06 NOTE — H&P (Signed)
Chief Complaint: Patient was seen in consultation today for liver lesion at the request of Chinnasami,Bernard  Referring Physician(s): Chinnasami,Bernard  Supervising Physician: Aletta Edouard  Patient Status: [facility] - Out-pt  History of Present Illness: April Park is a 88 y.o. female with medical history significant for heart disease, a-fib with AICD, DM with insulin dependence, HLD, and hypertension. Daughter (Dr. Marcy Panning, oncologist in [facility]) provides history beginning in Dec 22 of cough, SOB requiring hospitalization and antibiotics for pneumonia.  Ongoing symptoms prompted bronchoscopy. Suspicion for malignancy, but no primary source confirmed.  CT last week shows liver lesion and Dr. Kathlene Cote was consulted for US guided liver lesion biopsy.    April Park endorses being sleepy today and throat bothering her.  Daughter shares that April Park continues with a persistent cough that is controlled with hycodan and body pains that are usually experienced seem improved since beginning steroids.  She performs independent ADLs, sometimes uses assistive walking device, and has help at home for daily chores.  Stopped Eliquis 08/02/21.  Past Medical History:  Diagnosis Date   A-fib [facility])    failed amio due to Gi side effects (in [place])    CAD (coronary artery disease)    non-obstructive CAD on cath January 2012. mLAD 40-50%   Chest pain    CHF (congestive heart failure) ([place])    due to recurrent NICM echo 06/2010: EF 20-25% mod-severe MR, moderate TR   Diabetes mellitus    GI bleed    Hyperlipidemia    Hypertension    S/P atrioventricular nodal ablation     Past Surgical History:  Procedure Laterality Date   CARDIAC CATHETERIZATION     COLONOSCOPY WITH PROPOFOL N/A 11/07/2016   Procedure: COLONOSCOPY WITH PROPOFOL;  Surgeon: [name], MD;  Location: [facility] ENDOSCOPY;  Service: Endoscopy;  Laterality: N/A;   ESOPHAGOGASTRODUODENOSCOPY (EGD) WITH PROPOFOL N/A 10/20/2016    Procedure: ESOPHAGOGASTRODUODENOSCOPY (EGD) WITH PROPOFOL;  Surgeon: [name], MD;  Location: [facility] ENDOSCOPY;  Service: Endoscopy;  Laterality: N/A;   GIVENS CAPSULE STUDY N/A 11/07/2016   Procedure: GIVENS CAPSULE STUDY;  Surgeon: [name], MD;  Location: [facility] ENDOSCOPY;  Service: Endoscopy;  Laterality: N/A;   KNEE ARTHROSCOPY     2009    Allergies: Ace inhibitors, Amiodarone, and Levaquin [levofloxacin]  Medications: Prior to Admission medications   Medication Sig Start Date End Date Taking? Authorizing Provider  albuterol (PROVENTIL) (2.5 MG/3ML) 0.083% nebulizer solution Take 3 mLs (2.5 mg total) by nebulization every 6 (six) hours as needed for wheezing or shortness of breath. 07/08/21  Yes Allyne Gee, MD  calcium-vitamin D 250-100 MG-UNIT tablet Take 1 tablet by mouth daily.   Yes [provider]  carvedilol (COREG) 12.5 MG tablet Take 12.5 mg by mouth 2 (two) times daily. 04/25/21  Yes [provider]  furosemide (LASIX) 20 MG tablet Take 20 mg by mouth as needed.   Yes [provider]  glucose blood test strip Use OneTouch Verio test strips as instructed to check blood sugar twice daily. 03/18/21  Yes Elayne Snare, MD  ipratropium-albuterol (DUONEB) 0.5-2.5 (3) MG/3ML SOLN Take 3 mLs by nebulization every 6 (six) hours as needed. 07/08/21 08/07/21 Yes Allyne Gee, MD  Magnesium 500 MG CAPS Take 1 capsule by mouth daily.   Yes [provider]  OneTouch Delica Lancets 03K MISC 1 each by Does not apply route See admin instructions. Use OneTouch Delica lancets to check blood sugar 2-3 times daily. 03/18/21  Yes Dwyane Dee,  Vicenta Aly, MD  pantoprazole (PROTONIX) 40 MG tablet Take 40 mg by mouth 2 (two) times daily. 02/26/21  Yes [provider]  sacubitril-valsartan (ENTRESTO) 49-51 MG Take 1 tablet by mouth 2 (two) times daily.   Yes [provider]  amiodarone (PACERONE) 200 MG tablet Take 1 tablet by mouth daily. 03/08/21   [provider]  dapagliflozin propanediol (FARXIGA) 5 MG TABS tablet Take 5 mg by mouth daily. 09/16/19   Elayne Snare, MD  glimepiride (AMARYL) 2 MG tablet Take 1 tablet (2 mg total) by mouth See admin instructions. Take 2mg  in the morning and 1mg  in the evening Patient taking differently: Take 1 mg by mouth See admin instructions. Take 1mg  in the morning and 1mg  in the evening 09/16/19   Elayne Snare, MD  metFORMIN (GLUCOPHAGE) 500 MG tablet Take 500 mg by mouth 2 (two) times daily. 01/06/21   [provider]  polyethylene glycol (MIRALAX / GLYCOLAX) 17 g packet Take 17 g by mouth daily.    [provider]  rosuvastatin (CRESTOR) 10 MG tablet Take 10 mg by mouth daily.    [provider]  sitaGLIPtin (JANUVIA) 100 MG tablet Take 1 tablet (100 mg total) by mouth daily. Patient taking differently: Take 50 mg by mouth daily. 09/16/19   Elayne Snare, MD  SLOW FE 142 (45 Fe) MG TBCR Take 1 tablet by mouth daily. 04/21/21   [provider]  spironolactone (ALDACTONE) 25 MG tablet Take 25 mg by mouth daily. 04/12/21   [provider]  XARELTO 15 MG TABS tablet Take 15 mg by mouth daily. 01/06/21   [provider]     Family History  Problem Relation Age of Onset   Arrhythmia Mother 26   Heart failure Mother        diastolic dysfunction   Diabetes Mother    Diabetes Sister        family history   Hypertension Other        family history   Diabetes Brother     Social History   Socioeconomic History   Marital status: Married    Spouse name: Not on file   Number of children: Not on file   Years of education: Not on file   Highest education level: Not on file  Occupational History   Not on file  Tobacco Use   Smoking status: Never   Smokeless tobacco: Never  Substance and Sexual Activity   Alcohol use: No   Drug use: No   Sexual activity: Not on file  Other Topics Concern   Not on file  Social History Narrative   Retired    Married     Tobacco Use - No.    Alcohol Use - no   Regular Exercise - no   Drug Use - no   Smoking Status:  never   Does Patient Exercise:  no   Drug Use:  no   Social Determinants of Radio broadcast assistant Strain: Not on file  Food Insecurity: Not on file  Transportation Needs: Not on file  Physical Activity: Not on file  Stress: Not on file  Social Connections: Not on file    Review of Systems: A 12 point ROS discussed and pertinent positives are indicated in the HPI above.  All other systems are negative.  Review of Systems  Constitutional:  Positive for fatigue. Negative for activity change and appetite change.  HENT: Negative.    Eyes: Negative.  Respiratory:  Positive for cough and shortness of breath.   Cardiovascular: Negative.   Gastrointestinal:  Positive for constipation. Negative for abdominal distention and abdominal pain.  Endocrine: Negative.   Genitourinary: Negative.   Musculoskeletal:  Positive for arthralgias.  Skin: Negative.   Allergic/Immunologic: Negative.   Neurological: Negative.   Hematological: Negative.   Psychiatric/Behavioral: Negative.     Vital Signs: BP 119/67   Physical Exam Vitals reviewed.  Constitutional:      General: She is not in acute distress.    Appearance: She is not toxic-appearing.  HENT:     Head: Normocephalic and atraumatic.     Mouth/Throat:     Mouth: Mucous membranes are moist.     Pharynx: Oropharynx is clear.     Comments: Perhaps a little thrush Eyes:     Extraocular Movements: Extraocular movements intact.  Cardiovascular:     Rate and Rhythm: Normal rate. Rhythm irregular.  Pulmonary:     Effort: Pulmonary effort is normal.     Comments: Diminished breath sounds RLL Abdominal:     General: Abdomen is flat.     Palpations: Abdomen is soft.  Skin:    General: Skin is warm and dry.  Neurological:     General: No focal deficit present.     Mental Status: She is alert and oriented to person, place, and  time.  Psychiatric:        Mood and Affect: Mood normal.        Behavior: Behavior normal.    Imaging: No results found.  Labs:  CBC: Recent Labs    08/06/21 0954  WBC 11.7*  HGB 10.6*  HCT 33.5*  PLT PENDING    COAGS: Recent Labs    08/06/21 0954  INR 1.1    BMP: Recent Labs    03/08/21 0000  CREATININE 1.2*    LIVER FUNCTION TESTS: No results for input(s): BILITOT, AST, ALT, ALKPHOS, PROT, ALBUMIN in the last 8760 hours.  TUMOR MARKERS: No results for input(s): AFPTM, CEA, CA199, CHROMGRNA in the last 8760 hours.  Assessment and Plan:  Liver lesion --OK to proceed with planned biopsy with d/c later today --Pt has been NPO and stopped Eliquis --Daughter/Dr. Humphrey Rolls requesting NextGen sequence testing on sampled tissue.  Thrush --likely secondary to steroids and DM status --sent Nystatin mouth wash to [facility], at request --will need PCP follow up if symptoms persist  Risks and benefits of biopsy was discussed with the patient and/or patient's family including, but not limited to bleeding, infection, damage to adjacent structures or low yield requiring additional tests.  All of the questions were answered and there is agreement to proceed.  Consent signed and in chart.   Thank you for this interesting consult.  I greatly enjoyed meeting Shams Mallen and look forward to participating in their care.  A copy of this report was sent to the requesting provider on this date.  Electronically Signed: Pasty Spillers, PA 08/06/2021, 10:34 AM   I spent a total of 30 Minutes  in face to face in clinical consultation, greater than 50% of which was counseling/coordinating care for liver lesion biopsy.

## 2021-08-06 NOTE — Sedation Documentation (Signed)
Liver biopsy site is clean, dry and intact. No drainage noted from dressing.

## 2021-08-06 NOTE — Procedures (Signed)
Interventional Radiology Procedure Note  Procedure: US Guided Biopsy of Liver lesion  Complications: None  Estimated Blood Loss: < 10 mL  Findings: 18 G core biopsy of right lobe liver lesion performed under US guidance.  Three core samples obtained and sent to Pathology.  Venetia Night. Kathlene Cote, M.D Pager:  [phone]

## 2021-08-06 NOTE — Discharge Instructions (Signed)
May resume Eliquis tomorrow 2/18

## 2021-08-11 LAB — SURGICAL PATHOLOGY

## 2021-08-27 NOTE — Progress Notes (Signed)
Called to remind patient/family of upcoming MRI.  I was told that patient no longer needs MRI, that she had a CT scan and has other health issues at this time.   ?

## 2021-08-30 ENCOUNTER — Ambulatory Visit (HOSPITAL_COMMUNITY)
Admission: RE | Admit: 2021-08-30 | Discharge: 2021-08-30 | Disposition: A | Discharge status: Home / Self Care | Payer: Medicare Other | Source: Ambulatory Visit | Attending: Physician Assistant | Admitting: Physician Assistant

## 2021-08-30 ENCOUNTER — Encounter (HOSPITAL_COMMUNITY): Payer: Self-pay

## 2021-09-06 ENCOUNTER — Telehealth: Payer: Self-pay

## 2021-09-06 ENCOUNTER — Ambulatory Visit: Payer: Medicare Other | Admitting: Internal Medicine

## 2021-09-06 ENCOUNTER — Other Ambulatory Visit: Payer: Self-pay | Admitting: Physician Assistant

## 2021-09-06 DIAGNOSIS — R0902 Hypoxemia: Secondary | ICD-10-CM

## 2021-09-06 DIAGNOSIS — C787 Secondary malignant neoplasm of liver and intrahepatic bile duct: Secondary | ICD-10-CM

## 2021-09-06 NOTE — Telephone Encounter (Signed)
Spoke with beth from [facility] that we put oxygen order please review  ?

## 2021-09-06 NOTE — Progress Notes (Signed)
Recent diagnosis of metastatic adenocarcinoma of lung ?Worsening sob, desaturations with minimum exertion  ?O2 sats of 88%  ?Will need O2 concentrator and portable device ( light weight)  ?

## 2021-09-06 NOTE — Addendum Note (Signed)
Addended by: Corlis Hove on: 09/06/2021 11:15 AM ? ? Modules accepted: Orders ? ?

## 2021-09-15 ENCOUNTER — Encounter (HOSPITAL_COMMUNITY): Payer: Self-pay

## 2021-09-29 ENCOUNTER — Inpatient Hospital Stay
Admission: AD | Admit: 2021-09-29 | Discharge: 2021-10-07 | Disposition: A | Discharge status: Other Acute Inpatient Hospital | Payer: Self-pay | Source: Other Acute Inpatient Hospital

## 2021-09-29 ENCOUNTER — Other Ambulatory Visit (HOSPITAL_COMMUNITY): Payer: Self-pay

## 2021-09-29 LAB — COMPREHENSIVE METABOLIC PANEL
ALT: 55 U/L — ABNORMAL HIGH (ref 0–44)
AST: 68 U/L — ABNORMAL HIGH (ref 15–41)
Albumin: 2.2 g/dL — ABNORMAL LOW (ref 3.5–5.0)
Alkaline Phosphatase: 234 U/L — ABNORMAL HIGH (ref 38–126)
Anion gap: 8 (ref 5–15)
BUN: 99 mg/dL — ABNORMAL HIGH (ref 8–23)
CO2: 25 mmol/L (ref 22–32)
Calcium: 8 mg/dL — ABNORMAL LOW (ref 8.9–10.3)
Chloride: 95 mmol/L — ABNORMAL LOW (ref 98–111)
Creatinine, Ser: 3.16 mg/dL — ABNORMAL HIGH (ref 0.44–1.00)
GFR, Estimated: 14 mL/min — ABNORMAL LOW (ref >60)
Glucose, Bld: 501 mg/dL (ref 70–99)
Potassium: 5.3 mmol/L — ABNORMAL HIGH (ref 3.5–5.1)
Sodium: 128 mmol/L — ABNORMAL LOW (ref 135–145)
Total Bilirubin: 1.6 mg/dL — ABNORMAL HIGH (ref 0.3–1.2)
Total Protein: 4.6 g/dL — ABNORMAL LOW (ref 6.5–8.1)

## 2021-09-29 LAB — CBC WITH DIFFERENTIAL/PLATELET
Abs Immature Granulocytes: 0.78 10*3/uL — ABNORMAL HIGH (ref 0.00–0.07)
Basophils Absolute: 0.1 10*3/uL (ref 0.0–0.1)
Basophils Relative: 0 %
Eosinophils Absolute: 0 10*3/uL (ref 0.0–0.5)
Eosinophils Relative: 0 %
HCT: 31 % — ABNORMAL LOW (ref 36.0–46.0)
Hemoglobin: 9.8 g/dL — ABNORMAL LOW (ref 12.0–15.0)
Immature Granulocytes: 5 %
Lymphocytes Relative: 1 %
Lymphs Abs: 0.2 10*3/uL — ABNORMAL LOW (ref 0.7–4.0)
MCH: 33.2 pg (ref 26.0–34.0)
MCHC: 31.6 g/dL (ref 30.0–36.0)
MCV: 105.1 fL — ABNORMAL HIGH (ref 80.0–100.0)
Monocytes Absolute: 0.7 10*3/uL (ref 0.1–1.0)
Monocytes Relative: 5 %
Neutro Abs: 13 10*3/uL — ABNORMAL HIGH (ref 1.7–7.7)
Neutrophils Relative %: 89 %
Platelets: 16 10*3/uL — CL (ref 150–400)
RBC: 2.95 MIL/uL — ABNORMAL LOW (ref 3.87–5.11)
RDW: 23.6 % — ABNORMAL HIGH (ref 11.5–15.5)
WBC: 14.7 10*3/uL — ABNORMAL HIGH (ref 4.0–10.5)
nRBC: 2.7 % — ABNORMAL HIGH (ref 0.0–0.2)

## 2021-09-29 LAB — D-DIMER, QUANTITATIVE: D-Dimer, Quant: >20 ug/mL-FEU — ABNORMAL HIGH (ref 0.00–0.50)

## 2021-09-29 LAB — BRAIN NATRIURETIC PEPTIDE: B Natriuretic Peptide: 638.8 pg/mL — ABNORMAL HIGH (ref 0.0–100.0)

## 2021-09-29 LAB — MAGNESIUM: Magnesium: 2.6 mg/dL — ABNORMAL HIGH (ref 1.7–2.4)

## 2021-09-29 LAB — TROPONIN I (HIGH SENSITIVITY)
Troponin I (High Sensitivity): 230 ng/L (ref <18)
Troponin I (High Sensitivity): 222 ng/L (ref <18)

## 2021-09-29 LAB — DIGOXIN LEVEL: Digoxin Level: 3.4 ng/mL (ref 0.8–2.0)

## 2021-09-29 LAB — PHOSPHORUS: Phosphorus: 2.8 mg/dL (ref 2.5–4.6)

## 2021-09-29 NOTE — Consult Note (Signed)
Referring Physician: Laurelyn Sickle, MD ? ?April Park is an 88 y.o. female.                       ?Chief Complaint: Congestive heart failure in patient with metastatic cancer ? ?HPI: 88 years old Asian female with PMH of Atrial fibrillation, CAD, Dilated cardiomyopathy, S/P AV nodal ablation with Pacemaker followed by AICD, Type 2 DM, Acute renal failure, Hypertension and hyperlipidemia has congestive heart failure with acute renal failure. Her recent LVEF is 20-25 % per family who are in medical field. She also has non-small cell lung cancer with subtotal opacification of the right lung with bilateral pulmonary nodules. Patient acknowledges shortness of breath at rest but denies left sided chest pain. ? ?Past Medical History:  ?Diagnosis Date  ? A-fib ([place])   ? failed amio due to Gi side effects (in [place])   ? CAD (coronary artery disease)   ? non-obstructive CAD on cath January 2012. mLAD 40-50%  ? Chest pain   ? CHF (congestive heart failure) ([place])   ? due to recurrent NICM echo 06/2010: EF 20-25% mod-severe MR, moderate TR  ? Diabetes mellitus   ? GI bleed   ? Hyperlipidemia   ? Hypertension   ? S/P atrioventricular nodal ablation   ?  ? ? ?Past Surgical History:  ?Procedure Laterality Date  ? CARDIAC CATHETERIZATION    ? COLONOSCOPY WITH PROPOFOL N/A 11/07/2016  ? Procedure: COLONOSCOPY WITH PROPOFOL;  Surgeon: [name], MD;  Location: [facility] ENDOSCOPY;  Service: Endoscopy;  Laterality: N/A;  ? ESOPHAGOGASTRODUODENOSCOPY (EGD) WITH PROPOFOL N/A 10/20/2016  ? Procedure: ESOPHAGOGASTRODUODENOSCOPY (EGD) WITH PROPOFOL;  Surgeon: [name], MD;  Location: [facility] ENDOSCOPY;  Service: Endoscopy;  Laterality: N/A;  ? GIVENS CAPSULE STUDY N/A 11/07/2016  ? Procedure: GIVENS CAPSULE STUDY;  Surgeon: [name], MD;  Location: [facility] ENDOSCOPY;  Service: Endoscopy;  Laterality: N/A;  ? KNEE ARTHROSCOPY    ? 2009  ? ? ?Family History  ?Problem Relation Age of Onset  ? Arrhythmia Mother 53  ? Heart failure Mother   ?     diastolic  dysfunction  ? Diabetes Mother   ? Diabetes Sister   ?     family history  ? Hypertension Other   ?     family history  ? Diabetes Brother   ? ?Social History:  reports that she has never smoked. She has never used smokeless tobacco. She reports that she does not drink alcohol and does not use drugs. ? ?Allergies:  ?Allergies  ?Allergen Reactions  ? Ace Inhibitors Cough  ? Amiodarone Nausea Only and Nausea And Vomiting  ?  Has tried med on 2 different occasions with same reaction   ? Levaquin [Levofloxacin] Nausea And Vomiting  ? ? ?Medications Prior to Admission  ?Medication Sig Dispense Refill  ? albuterol (PROVENTIL) (2.5 MG/3ML) 0.083% nebulizer solution Take 3 mLs (2.5 mg total) by nebulization every 6 (six) hours as needed for wheezing or shortness of breath. 75 mL 3  ? amiodarone (PACERONE) 200 MG tablet Take 1 tablet by mouth daily.    ? calcium-vitamin D 250-100 MG-UNIT tablet Take 1 tablet by mouth daily.    ? carvedilol (COREG) 12.5 MG tablet Take 12.5 mg by mouth 2 (two) times daily.    ? dapagliflozin propanediol (FARXIGA) 5 MG TABS tablet Take 5 mg by mouth daily. 90 tablet 1  ? furosemide (LASIX) 20 MG tablet Take 20 mg by mouth as needed.    ?  glimepiride (AMARYL) 2 MG tablet Take 1 tablet (2 mg total) by mouth See admin instructions. Take 2mg  in the morning and 1mg  in the evening (Patient taking differently: Take 1 mg by mouth See admin instructions. Take 1mg  in the morning and 1mg  in the evening) 180 tablet 1  ? glucose blood test strip Use OneTouch Verio test strips as instructed to check blood sugar twice daily. 200 each 2  ? ipratropium-albuterol (DUONEB) 0.5-2.5 (3) MG/3ML SOLN Take 3 mLs by nebulization every 6 (six) hours as needed. 360 mL 4  ? Magnesium 500 MG CAPS Take 1 capsule by mouth daily.    ? metFORMIN (GLUCOPHAGE) 500 MG tablet Take 500 mg by mouth 2 (two) times daily.    ? OneTouch Delica Lancets 44I MISC 1 each by Does not apply route See admin instructions. Use OneTouch Delica  lancets to check blood sugar 2-3 times daily. 100 each 3  ? pantoprazole (PROTONIX) 40 MG tablet Take 40 mg by mouth 2 (two) times daily.    ? polyethylene glycol (MIRALAX / GLYCOLAX) 17 g packet Take 17 g by mouth daily.    ? rosuvastatin (CRESTOR) 10 MG tablet Take 10 mg by mouth daily.    ? sacubitril-valsartan (ENTRESTO) 49-51 MG Take 1 tablet by mouth 2 (two) times daily.    ? sitaGLIPtin (JANUVIA) 100 MG tablet Take 1 tablet (100 mg total) by mouth daily. (Patient taking differently: Take 50 mg by mouth daily.) 90 tablet 2  ? SLOW FE 142 (45 Fe) MG TBCR Take 1 tablet by mouth daily.    ? spironolactone (ALDACTONE) 25 MG tablet Take 25 mg by mouth daily.    ? XARELTO 15 MG TABS tablet Take 15 mg by mouth daily.    ? ? ?No results found for this or any previous visit (from the past 48 hour(s)). ?DG CHEST PORT 1 VIEW ? ?Result Date: 09/29/2021 ?CLINICAL DATA:  Respiratory failure. EXAM: PORTABLE CHEST 1 VIEW COMPARISON:  September 20, 2021. FINDINGS: Stable large right pleural effusion is noted with small amount of aerated right upper lobe. Left-sided defibrillator is unchanged in position. Left lung is clear. Stable cardiomediastinal silhouette. Right-sided PICC line is unchanged. Bony thorax is unremarkable. IMPRESSION: Stable large right pleural effusion is noted with associated atelectasis or infiltrate. Electronically Signed   By: Marijo Conception M.D.   On: 09/29/2021 17:19   ? ?Review Of Systems ?Constitutional: No fever, chills, weight loss or gain. ?Eyes: No vision change, wears glasses. No discharge or pain. ?Ears: No hearing loss, No tinnitus. ?Respiratory: No asthma, COPD, positive pneumonias, shortness of breath. No hemoptysis. ?Cardiovascular: Positive chest pain, palpitation, leg edema. ?Gastrointestinal: No nausea, vomiting, diarrhea, constipation. No GI bleed. No hepatitis. ?Genitourinary: No dysuria, hematuria, kidney stone. No incontinance. ?Neurological: No headache, stroke, seizures.  ?Psychiatry:  No psych facility admission for anxiety, depression, suicide. No detox. ?Skin: No rash. ?Musculoskeletal: Positive joint pain, no fibromyalgia. No neck pain, back pain. ?Lymphadenopathy: No lymphadenopathy. ?Hematology: No anemia or easy bruising. ? ? ?There were no vitals taken for this visit. There is no height or weight on file to calculate BMI. ?General appearance: alert, cooperative, appears stated age and no distress ?Head: Normocephalic, atraumatic. ?Eyes: Brown eyes, pale pink conjunctiva, corneas clear. ?Neck: No adenopathy, no carotid bruit, no JVD at 0 degree angle, supple, symmetrical, trachea midline and thyroid not enlarged. ?Resp: Clearing to auscultation bilaterally. ?Cardio: Irregular rate and rhythm, S1, S2 normal, II/VI systolic murmur, no click, rub or gallop ?GI:  Soft, non-tender; bowel sounds normal; no organomegaly. ?Extremities: 2 + edema, no cyanosis or clubbing. ?Skin: Warm and dry.  ?Neurologic: Alert and oriented X 3, normal strength. Normal coordination and gait. ? ?Assessment/Plan ?Acute on chronic respiratory failure with hypoxia ?Non-small cell lung CA with metastasis ?Chronic left heart systolic failure ?Dilated cardiomyopathy ?CAD ?Acute renal failure ?Type 2 DM with hyperglycemia ?HTN ?Atrial fibrillation, chronic, CHA2DS2VASc score of 5 ? ?Plan: ?Consider IV milrinone low dose to start as tolerated. ?Continue Eliquis. ?Check digoxin level. ? ?Time spent: Review of old records, Lab, x-rays, EKG, other cardiac tests, examination, discussion with patient/Family/Nurse over 70 minutes. ? ?Birdie Riddle, MD ? ?09/29/2021, 5:44 PM ? ? ? ?

## 2021-09-30 LAB — BLOOD CULTURE ID PANEL (REFLEXED) - BCID2

## 2021-09-30 LAB — BASIC METABOLIC PANEL
Anion gap: 10 (ref 5–15)
BUN: 99 mg/dL — ABNORMAL HIGH (ref 8–23)
CO2: 25 mmol/L (ref 22–32)
Calcium: 8.4 mg/dL — ABNORMAL LOW (ref 8.9–10.3)
Chloride: 98 mmol/L (ref 98–111)
Creatinine, Ser: 3.2 mg/dL — ABNORMAL HIGH (ref 0.44–1.00)
GFR, Estimated: 14 mL/min — ABNORMAL LOW (ref >60)
Glucose, Bld: 91 mg/dL (ref 70–99)
Potassium: 4.8 mmol/L (ref 3.5–5.1)
Sodium: 133 mmol/L — ABNORMAL LOW (ref 135–145)

## 2021-09-30 LAB — TROPONIN I (HIGH SENSITIVITY): Troponin I (High Sensitivity): 331 ng/L (ref <18)

## 2021-09-30 NOTE — Consult Note (Signed)
Ref: Jodi Marble, MD ? ? ?Subjective:  ?Off milrinone and on dopamine drip at 13 mcg/kg/min. ?Also on Bipap for increasing respiratory distress. ? ?Objective:  ?Vital Signs in the last 24 hours: ? P: 84, R: 30, BP: 130/80 ? ?Physical Exam: ?BP Readings from Last 1 Encounters:  ?08/06/21 (!) 90/58  ?   ?Wt Readings from Last 1 Encounters:  ?05/06/21 66.2 kg  ?  Weight change:  There is no height or weight on file to calculate BMI. ?HEENT: [place]/AT, Eyes-Brown, Conjunctiva-Pale pink, Sclera-Non-icteric ?Neck: No JVD, No bruit, Trachea midline. ?Lungs:  Clearing, Bilateral. ?Cardiac:  Regular rhythm, normal S1 and S2, no S3. II/VI systolic murmur. ?Abdomen:  Soft, non-tender. BS present. ?Extremities:  2 + lower leg edema present. No cyanosis. No clubbing. ?CNS: AxOx0. Cranial nerves grossly intact.  ?Skin: Warm and dry. ? ? ?Intake/Output from previous day: ?No intake/output data recorded. ? ? ? ?Lab Results: ?BMET ?   ?Component Value Date/Time  ? NA 133 (L) 09/30/2021 0456  ? NA 128 (L) 09/29/2021 1738  ? NA 139 09/13/2019 1536  ? NA 139 08/14/2019 0000  ? K 4.8 09/30/2021 0456  ? K 5.3 (H) 09/29/2021 1738  ? K 3.6 09/13/2019 1536  ? CL 98 09/30/2021 0456  ? CL 95 (L) 09/29/2021 1738  ? CL 102 09/13/2019 1536  ? CO2 25 09/30/2021 0456  ? CO2 25 09/29/2021 1738  ? CO2 29 09/13/2019 1536  ? GLUCOSE 91 09/30/2021 0456  ? GLUCOSE 501 (HH) 09/29/2021 1738  ? GLUCOSE 106 (H) 09/13/2019 1536  ? BUN 99 (H) 09/30/2021 0456  ? BUN 99 (H) 09/29/2021 1738  ? BUN 21 09/13/2019 1536  ? BUN 40 (A) 08/14/2019 0000  ? CREATININE 3.20 (H) 09/30/2021 0456  ? CREATININE 3.16 (H) 09/29/2021 1738  ? CREATININE 1.2 (A) 03/08/2021 0000  ? CREATININE 0.90 09/13/2019 1536  ? CALCIUM 8.4 (L) 09/30/2021 0456  ? CALCIUM 8.0 (L) 09/29/2021 1738  ? CALCIUM 9.1 09/13/2019 1536  ? GFRNONAA 14 (L) 09/30/2021 0456  ? GFRNONAA 14 (L) 09/29/2021 1738  ? GFRNONAA 58 (L) 09/13/2019 1536  ? GFRNONAA 46 08/14/2019 0000  ? GFRNONAA 55 (L) 10/20/2016  0336  ? GFRAA >60 09/13/2019 1536  ? GFRAA >60 10/20/2016 0336  ? GFRAA >60 10/19/2016 0414  ? ?CBC ?   ?Component Value Date/Time  ? WBC 14.7 (H) 09/29/2021 1738  ? RBC 2.95 (L) 09/29/2021 1738  ? HGB 9.8 (L) 09/29/2021 1738  ? HCT 31.0 (L) 09/29/2021 1738  ? PLT 16 (LL) 09/29/2021 1738  ? MCV 105.1 (H) 09/29/2021 1738  ? MCH 33.2 09/29/2021 1738  ? MCHC 31.6 09/29/2021 1738  ? RDW 23.6 (H) 09/29/2021 1738  ? LYMPHSABS 0.2 (L) 09/29/2021 1738  ? MONOABS 0.7 09/29/2021 1738  ? EOSABS 0.0 09/29/2021 1738  ? BASOSABS 0.1 09/29/2021 1738  ? ?HEPATIC Function Panel ?Recent Labs  ?  09/29/21 ?1738  ?PROT 4.6*  ? ?HEMOGLOBIN A1C ?No components found for: HGA1C,  MPG ?CARDIAC ENZYMES ?No results found for: CKTOTAL, CKMB, CKMBINDEX, TROPONINI ?BNP ?No results for input(s): PROBNP in the last 8760 hours. ?TSH ?No results for input(s): TSH in the last 8760 hours. ?CHOLESTEROL ?No results for input(s): CHOL in the last 8760 hours. ? ?Scheduled Meds: ?Continuous Infusions: ?PRN Meds:. ? ?Assessment/Plan: ? Acute on chronic respiratory failure with hypoxia ?Non-small cell lung CA with metastasis ?Chronic systolic left heart failure ?Digoxin toxicity ?Dilated cardiomyopathy ?CAD ?Acute renal failure ?Type 2 DM ?HTN ?  Chronic atrial fibrillation ? ?Plan: ?Continue IV dopamine and titrate downward as tolerated. ?May use Digibind for digoxin toxicity. ?Prognosis remains poor. ? ? LOS: 0 days  ? ?Time spent including chart review, lab review, examination, discussion with patient/Nurse : 30 min ? ? ?Dixie Dials  MD  ?09/30/2021, 6:47 PM ? ? ? ? ?

## 2021-10-01 ENCOUNTER — Other Ambulatory Visit (HOSPITAL_COMMUNITY): Payer: Self-pay

## 2021-10-01 LAB — CBC
HCT: 28.1 % — ABNORMAL LOW (ref 36.0–46.0)
Hemoglobin: 9.1 g/dL — ABNORMAL LOW (ref 12.0–15.0)
MCH: 33.7 pg (ref 26.0–34.0)
MCHC: 32.4 g/dL (ref 30.0–36.0)
MCV: 104.1 fL — ABNORMAL HIGH (ref 80.0–100.0)
Platelets: 17 10*3/uL — CL (ref 150–400)
RBC: 2.7 MIL/uL — ABNORMAL LOW (ref 3.87–5.11)
RDW: 23.3 % — ABNORMAL HIGH (ref 11.5–15.5)
WBC: 16.4 10*3/uL — ABNORMAL HIGH (ref 4.0–10.5)
nRBC: 3 % — ABNORMAL HIGH (ref 0.0–0.2)

## 2021-10-01 LAB — COMPREHENSIVE METABOLIC PANEL
ALT: 69 U/L — ABNORMAL HIGH (ref 0–44)
AST: 76 U/L — ABNORMAL HIGH (ref 15–41)
Albumin: 3.1 g/dL — ABNORMAL LOW (ref 3.5–5.0)
Alkaline Phosphatase: 210 U/L — ABNORMAL HIGH (ref 38–126)
Anion gap: 8 (ref 5–15)
BUN: 97 mg/dL — ABNORMAL HIGH (ref 8–23)
CO2: 26 mmol/L (ref 22–32)
Calcium: 8.7 mg/dL — ABNORMAL LOW (ref 8.9–10.3)
Chloride: 96 mmol/L — ABNORMAL LOW (ref 98–111)
Creatinine, Ser: 3.08 mg/dL — ABNORMAL HIGH (ref 0.44–1.00)
GFR, Estimated: 14 mL/min — ABNORMAL LOW (ref >60)
Glucose, Bld: 111 mg/dL — ABNORMAL HIGH (ref 70–99)
Potassium: 4.8 mmol/L (ref 3.5–5.1)
Sodium: 130 mmol/L — ABNORMAL LOW (ref 135–145)
Total Bilirubin: 2.8 mg/dL — ABNORMAL HIGH (ref 0.3–1.2)
Total Protein: 5.7 g/dL — ABNORMAL LOW (ref 6.5–8.1)

## 2021-10-01 LAB — PATHOLOGIST SMEAR REVIEW

## 2021-10-01 LAB — D-DIMER, QUANTITATIVE: D-Dimer, Quant: >20 ug/mL-FEU — ABNORMAL HIGH (ref 0.00–0.50)

## 2021-10-01 NOTE — Progress Notes (Signed)
2D Echocardiogram was attempted, patient is too combative for echo at this time. Will consult with cardiology to ensure necessity of echo before re-attempting. ? ?[place] ?

## 2021-10-01 NOTE — Progress Notes (Addendum)
Pt was brought to [place] for therapeutic thoracentesis. Upon US examination, there was a very small fluid pocket on R that was not large enough to safely access.  ?Exam was ended and explained to patient, RN and caregiver.  ? ? ?Narda Rutherford, AGNP-BC ?10/01/2021, 4:35 PM ? ? ? ?

## 2021-10-02 ENCOUNTER — Other Ambulatory Visit (HOSPITAL_COMMUNITY): Payer: Self-pay

## 2021-10-02 LAB — CULTURE, BLOOD (ROUTINE X 2): Special Requests: ADEQUATE

## 2021-10-02 LAB — ECHOCARDIOGRAM COMPLETE: S' Lateral: 2.9 cm

## 2021-10-02 LAB — CBC
HCT: 28.1 % — ABNORMAL LOW (ref 36.0–46.0)
HCT: 27.4 % — ABNORMAL LOW (ref 36.0–46.0)
Hemoglobin: 9.1 g/dL — ABNORMAL LOW (ref 12.0–15.0)
Hemoglobin: 8.6 g/dL — ABNORMAL LOW (ref 12.0–15.0)
MCH: 33.7 pg (ref 26.0–34.0)
MCH: 33.6 pg (ref 26.0–34.0)
MCHC: 32.4 g/dL (ref 30.0–36.0)
MCHC: 31.4 g/dL (ref 30.0–36.0)
MCV: 104.1 fL — ABNORMAL HIGH (ref 80.0–100.0)
MCV: 107 fL — ABNORMAL HIGH (ref 80.0–100.0)
Platelets: <5 10*3/uL — CL (ref 150–400)
Platelets: 62 10*3/uL — ABNORMAL LOW (ref 150–400)
RBC: 2.7 MIL/uL — ABNORMAL LOW (ref 3.87–5.11)
RBC: 2.56 MIL/uL — ABNORMAL LOW (ref 3.87–5.11)
RDW: 23.9 % — ABNORMAL HIGH (ref 11.5–15.5)
RDW: 23.9 % — ABNORMAL HIGH (ref 11.5–15.5)
WBC: 10.1 10*3/uL (ref 4.0–10.5)
WBC: 10.5 10*3/uL (ref 4.0–10.5)
nRBC: 2.5 % — ABNORMAL HIGH (ref 0.0–0.2)
nRBC: 1.9 % — ABNORMAL HIGH (ref 0.0–0.2)

## 2021-10-02 LAB — COMPREHENSIVE METABOLIC PANEL
ALT: 57 U/L — ABNORMAL HIGH (ref 0–44)
AST: 68 U/L — ABNORMAL HIGH (ref 15–41)
Albumin: 3.6 g/dL (ref 3.5–5.0)
Alkaline Phosphatase: 186 U/L — ABNORMAL HIGH (ref 38–126)
Anion gap: 12 (ref 5–15)
BUN: 85 mg/dL — ABNORMAL HIGH (ref 8–23)
CO2: 24 mmol/L (ref 22–32)
Calcium: 8.9 mg/dL (ref 8.9–10.3)
Chloride: 100 mmol/L (ref 98–111)
Creatinine, Ser: 2.74 mg/dL — ABNORMAL HIGH (ref 0.44–1.00)
GFR, Estimated: 16 mL/min — ABNORMAL LOW (ref >60)
Glucose, Bld: 222 mg/dL — ABNORMAL HIGH (ref 70–99)
Potassium: 5 mmol/L (ref 3.5–5.1)
Sodium: 136 mmol/L (ref 135–145)
Total Bilirubin: 2.7 mg/dL — ABNORMAL HIGH (ref 0.3–1.2)
Total Protein: 5.6 g/dL — ABNORMAL LOW (ref 6.5–8.1)

## 2021-10-02 LAB — HEPARIN LEVEL (UNFRACTIONATED): Heparin Unfractionated: 0.6 IU/mL (ref 0.30–0.70)

## 2021-10-02 LAB — DIC (DISSEMINATED INTRAVASCULAR COAGULATION)PANEL
D-Dimer, Quant: 15.59 ug/mL-FEU — ABNORMAL HIGH (ref 0.00–0.50)
Fibrinogen: 155 mg/dL — ABNORMAL LOW (ref 210–475)
INR: 1.3 — ABNORMAL HIGH (ref 0.8–1.2)
Platelets: 67 10*3/uL — ABNORMAL LOW (ref 150–400)
Prothrombin Time: 16.3 seconds — ABNORMAL HIGH (ref 11.4–15.2)
aPTT: 28 seconds (ref 24–36)

## 2021-10-02 LAB — DIGOXIN LEVEL: Digoxin Level: 2.7 ng/mL (ref 0.8–2.0)

## 2021-10-02 LAB — AMMONIA: Ammonia: 45 umol/L — ABNORMAL HIGH (ref 9–35)

## 2021-10-02 LAB — ABO/RH: ABO/RH(D): A POS

## 2021-10-02 NOTE — Progress Notes (Signed)
?  Echocardiogram ?2D Echocardiogram has been performed. ? ?Elmer Ramp ?10/02/2021, 11:54 AM ?

## 2021-10-02 NOTE — Consult Note (Signed)
Ref: Jodi Marble, MD ? ? ?Subjective:  ?Awake. Responds to daughter's comforting voice. ?VS stable on 6 mcg of dopamine drip. ?Small improvement in creatinine and urine output. ?LV function is improved with inotropic infusion. ?Decreasing leg edema. ?Oxygen by nasal cannula. ? ?Objective:  ?Vital Signs in the last 24 hours: ? P: 85, R: 28, BP : 105/60 O 2 sat 91 %. ? ?Physical Exam: ?BP Readings from Last 1 Encounters:  ?08/06/21 (!) 90/58  ?   ?Wt Readings from Last 1 Encounters:  ?05/06/21 66.2 kg  ?  Weight change:  There is no height or weight on file to calculate BMI. ?HEENT: [place]/AT, Eyes-Brown,  Conjunctiva-Pale pink, Sclera-Non-icteric ?Neck: No JVD, No bruit, Trachea midline. ?Lungs:  Clear,ing Bilateral. ?Cardiac:  Regular rhythm, normal S1 and S2, no S3. II/VI systolic murmur. ?Abdomen:  Soft, non-tender. BS present. ?Extremities:  1 + edema present. No cyanosis. No clubbing. ?CNS: AxOx1, Cranial nerves grossly intact.  ?Skin: Warm and dry. ? ? ?Intake/Output from previous day: ?No intake/output data recorded. ? ? ? ?Lab Results: ?BMET ?   ?Component Value Date/Time  ? NA 136 10/02/2021 0335  ? NA 130 (L) 10/01/2021 0448  ? NA 133 (L) 09/30/2021 0456  ? NA 139 08/14/2019 0000  ? K 5.0 10/02/2021 0335  ? K 4.8 10/01/2021 0448  ? K 4.8 09/30/2021 0456  ? CL 100 10/02/2021 0335  ? CL 96 (L) 10/01/2021 0448  ? CL 98 09/30/2021 0456  ? CO2 24 10/02/2021 0335  ? CO2 26 10/01/2021 0448  ? CO2 25 09/30/2021 0456  ? GLUCOSE 222 (H) 10/02/2021 0335  ? GLUCOSE 111 (H) 10/01/2021 0448  ? GLUCOSE 91 09/30/2021 0456  ? BUN 85 (H) 10/02/2021 0335  ? BUN 97 (H) 10/01/2021 0448  ? BUN 99 (H) 09/30/2021 0456  ? BUN 40 (A) 08/14/2019 0000  ? CREATININE 2.74 (H) 10/02/2021 0335  ? CREATININE 3.08 (H) 10/01/2021 0448  ? CREATININE 3.20 (H) 09/30/2021 0456  ? CALCIUM 8.9 10/02/2021 0335  ? CALCIUM 8.7 (L) 10/01/2021 0448  ? CALCIUM 8.4 (L) 09/30/2021 0456  ? GFRNONAA 16 (L) 10/02/2021 0335  ? GFRNONAA 14 (L) 10/01/2021  0448  ? GFRNONAA 14 (L) 09/30/2021 0456  ? GFRAA >60 09/13/2019 1536  ? GFRAA >60 10/20/2016 0336  ? GFRAA >60 10/19/2016 0414  ? ?CBC ?   ?Component Value Date/Time  ? WBC 10.5 10/02/2021 1437  ? RBC 2.56 (L) 10/02/2021 1437  ? HGB 8.6 (L) 10/02/2021 1437  ? HCT 27.4 (L) 10/02/2021 1437  ? PLT 67 (L) 10/02/2021 1437  ? PLT 62 (L) 10/02/2021 1437  ? MCV 107.0 (H) 10/02/2021 1437  ? MCH 33.6 10/02/2021 1437  ? MCHC 31.4 10/02/2021 1437  ? RDW 23.9 (H) 10/02/2021 1437  ? LYMPHSABS 0.2 (L) 09/29/2021 1738  ? MONOABS 0.7 09/29/2021 1738  ? EOSABS 0.0 09/29/2021 1738  ? BASOSABS 0.1 09/29/2021 1738  ? ?HEPATIC Function Panel ?Recent Labs  ?  09/29/21 ?1738 10/01/21 ?0448 10/02/21 ?0335  ?PROT 4.6* 5.7* 5.6*  ? ?HEMOGLOBIN A1C ?No components found for: HGA1C,  MPG ?CARDIAC ENZYMES ?No results found for: CKTOTAL, CKMB, CKMBINDEX, TROPONINI ?BNP ?No results for input(s): PROBNP in the last 8760 hours. ?TSH ?No results for input(s): TSH in the last 8760 hours. ?CHOLESTEROL ?No results for input(s): CHOL in the last 8760 hours. ? ?Scheduled Meds: ?Continuous Infusions: ?PRN Meds:. ? ?Assessment/Plan: ? Acute on chronic respiratory failure with hypoxia ?Non-small cell lung CA with metastasis ?  Chronic systolic left heart failure ?Acute renal failure ?CAD ?Type 2 DM ?HTN ?Chronic atrial fibrillation ? ?Plan: ?Continue inotropic support. ?Decrease diuresis ? ? LOS: 0 days  ? ?Time spent including chart review, lab review, examination, discussion with patient/Family : 30 min ? ? ?Dixie Dials  MD  ?10/02/2021, 10:00 PM ? ? ? ? ?

## 2021-10-03 ENCOUNTER — Other Ambulatory Visit (HOSPITAL_COMMUNITY): Payer: Self-pay

## 2021-10-03 LAB — CBC
HCT: 27.1 % — ABNORMAL LOW (ref 36.0–46.0)
Hemoglobin: 8.8 g/dL — ABNORMAL LOW (ref 12.0–15.0)
MCH: 34.2 pg — ABNORMAL HIGH (ref 26.0–34.0)
MCHC: 32.5 g/dL (ref 30.0–36.0)
MCV: 105.4 fL — ABNORMAL HIGH (ref 80.0–100.0)
Platelets: 38 10*3/uL — ABNORMAL LOW (ref 150–400)
RBC: 2.57 MIL/uL — ABNORMAL LOW (ref 3.87–5.11)
RDW: 24.2 % — ABNORMAL HIGH (ref 11.5–15.5)
WBC: 12 10*3/uL — ABNORMAL HIGH (ref 4.0–10.5)
nRBC: 1.7 % — ABNORMAL HIGH (ref 0.0–0.2)

## 2021-10-03 LAB — PREPARE PLATELET PHERESIS: Unit division: 0

## 2021-10-03 LAB — BRAIN NATRIURETIC PEPTIDE: B Natriuretic Peptide: 929.8 pg/mL — ABNORMAL HIGH (ref 0.0–100.0)

## 2021-10-03 LAB — BPAM PLATELET PHERESIS
Blood Product Expiration Date: 202304172359
ISSUE DATE / TIME: 202304150921
Unit Type and Rh: 5100

## 2021-10-03 LAB — BASIC METABOLIC PANEL
Anion gap: 13 (ref 5–15)
BUN: 71 mg/dL — ABNORMAL HIGH (ref 8–23)
CO2: 26 mmol/L (ref 22–32)
Calcium: 8.5 mg/dL — ABNORMAL LOW (ref 8.9–10.3)
Chloride: 96 mmol/L — ABNORMAL LOW (ref 98–111)
Creatinine, Ser: 2.14 mg/dL — ABNORMAL HIGH (ref 0.44–1.00)
GFR, Estimated: 22 mL/min — ABNORMAL LOW (ref >60)
Glucose, Bld: 351 mg/dL — ABNORMAL HIGH (ref 70–99)
Potassium: 3.9 mmol/L (ref 3.5–5.1)
Sodium: 135 mmol/L (ref 135–145)

## 2021-10-03 LAB — MAGNESIUM: Magnesium: 2 mg/dL (ref 1.7–2.4)

## 2021-10-03 LAB — VITAMIN B12: Vitamin B-12: 4515 pg/mL — ABNORMAL HIGH (ref 180–914)

## 2021-10-03 NOTE — Consult Note (Signed)
Ref: Jodi Marble, MD ? ? ?Subjective:  ?Awake. Ate little food per family. ?BP low with dopamine drip at 6 mcg. ? ?Objective:  ?Vital Signs in the last 24 hours: ? P: 84, R: 24, BP : 90/50, O2 sat 92 % ? ?Physical Exam: ?BP Readings from Last 1 Encounters:  ?08/06/21 (!) 90/58  ?   ?Wt Readings from Last 1 Encounters:  ?05/06/21 66.2 kg  ?  Weight change:  There is no height or weight on file to calculate BMI. ?HEENT: [place]/AT, Eyes-Brown, Conjunctiva-Pale pink, Sclera-Non-icteric ?Neck: No JVD, No bruit, Trachea midline. ?Lungs:  Clearing, Bilateral. ?Cardiac:  Regular rhythm, normal S1 and S2, no S3. II/VI systolic murmur. ?Abdomen:  Soft, non-tender. BS present. ?Extremities:  1 + edema present. No cyanosis. No clubbing. Left hand and forearm swelling continues. ?CNS: AxOx1, Cranial nerves grossly intact, moves all 4 extremities.  ?Skin: Warm and dry. ? ? ?Intake/Output from previous day: ?No intake/output data recorded. ? ? ? ?Lab Results: ?BMET ?   ?Component Value Date/Time  ? NA 135 10/03/2021 0545  ? NA 136 10/02/2021 0335  ? NA 130 (L) 10/01/2021 0448  ? NA 139 08/14/2019 0000  ? K 3.9 10/03/2021 0545  ? K 5.0 10/02/2021 0335  ? K 4.8 10/01/2021 0448  ? CL 96 (L) 10/03/2021 0545  ? CL 100 10/02/2021 0335  ? CL 96 (L) 10/01/2021 0448  ? CO2 26 10/03/2021 0545  ? CO2 24 10/02/2021 0335  ? CO2 26 10/01/2021 0448  ? GLUCOSE 351 (H) 10/03/2021 0545  ? GLUCOSE 222 (H) 10/02/2021 0335  ? GLUCOSE 111 (H) 10/01/2021 0448  ? BUN 71 (H) 10/03/2021 0545  ? BUN 85 (H) 10/02/2021 0335  ? BUN 97 (H) 10/01/2021 0448  ? BUN 40 (A) 08/14/2019 0000  ? CREATININE 2.14 (H) 10/03/2021 0545  ? CREATININE 2.74 (H) 10/02/2021 0335  ? CREATININE 3.08 (H) 10/01/2021 0448  ? CALCIUM 8.5 (L) 10/03/2021 0545  ? CALCIUM 8.9 10/02/2021 0335  ? CALCIUM 8.7 (L) 10/01/2021 0448  ? GFRNONAA 22 (L) 10/03/2021 0545  ? GFRNONAA 16 (L) 10/02/2021 0335  ? GFRNONAA 14 (L) 10/01/2021 0448  ? GFRAA >60 09/13/2019 1536  ? GFRAA >60 10/20/2016 0336   ? GFRAA >60 10/19/2016 0414  ? ?CBC ?   ?Component Value Date/Time  ? WBC 12.0 (H) 10/03/2021 0545  ? RBC 2.57 (L) 10/03/2021 0545  ? HGB 8.8 (L) 10/03/2021 0545  ? HCT 27.1 (L) 10/03/2021 0545  ? PLT 38 (L) 10/03/2021 0545  ? MCV 105.4 (H) 10/03/2021 0545  ? MCH 34.2 (H) 10/03/2021 0545  ? MCHC 32.5 10/03/2021 0545  ? RDW 24.2 (H) 10/03/2021 0545  ? LYMPHSABS 0.2 (L) 09/29/2021 1738  ? MONOABS 0.7 09/29/2021 1738  ? EOSABS 0.0 09/29/2021 1738  ? BASOSABS 0.1 09/29/2021 1738  ? ?HEPATIC Function Panel ?Recent Labs  ?  09/29/21 ?1738 10/01/21 ?0448 10/02/21 ?0335  ?PROT 4.6* 5.7* 5.6*  ? ?HEMOGLOBIN A1C ?No components found for: HGA1C,  MPG ?CARDIAC ENZYMES ?No results found for: CKTOTAL, CKMB, CKMBINDEX, TROPONINI ?BNP ?No results for input(s): PROBNP in the last 8760 hours. ?TSH ?No results for input(s): TSH in the last 8760 hours. ?CHOLESTEROL ?No results for input(s): CHOL in the last 8760 hours. ? ?Scheduled Meds: ?Continuous Infusions: ?PRN Meds:. ? ?Assessment/Plan: ? Acute on chronic respiratory failure with hypoxia ?Non-small cell CA with metastasis ?Chronic systolic left heart failure ?Acute renal failure ?CAD ?Type 2 DM ?HTN ?Chronic atrial fibrillation ? ?Plan: ?  Continue inotropic support ?Hold diuretic. ? ? LOS: 0 days  ? ?Time spent including chart review, lab review, examination, discussion with patient/Family : 30 min ? ? ?Dixie Dials  MD  ?10/03/2021, 1:37 PM ? ? ? ? ?

## 2021-10-04 ENCOUNTER — Other Ambulatory Visit (HOSPITAL_COMMUNITY): Payer: Self-pay

## 2021-10-04 ENCOUNTER — Encounter (HOSPITAL_BASED_OUTPATIENT_CLINIC_OR_DEPARTMENT_OTHER): Payer: Medicare Other

## 2021-10-04 DIAGNOSIS — M7989 Other specified soft tissue disorders: Secondary | ICD-10-CM | POA: Diagnosis not present

## 2021-10-04 DIAGNOSIS — M79602 Pain in left arm: Secondary | ICD-10-CM | POA: Diagnosis not present

## 2021-10-04 LAB — CBC
HCT: 29.1 % — ABNORMAL LOW (ref 36.0–46.0)
Hemoglobin: 9.5 g/dL — ABNORMAL LOW (ref 12.0–15.0)
MCH: 34.2 pg — ABNORMAL HIGH (ref 26.0–34.0)
MCHC: 32.6 g/dL (ref 30.0–36.0)
MCV: 104.7 fL — ABNORMAL HIGH (ref 80.0–100.0)
Platelets: 23 10*3/uL — CL (ref 150–400)
RBC: 2.78 MIL/uL — ABNORMAL LOW (ref 3.87–5.11)
RDW: 24.8 % — ABNORMAL HIGH (ref 11.5–15.5)
WBC: 13.9 10*3/uL — ABNORMAL HIGH (ref 4.0–10.5)
nRBC: 2.2 % — ABNORMAL HIGH (ref 0.0–0.2)

## 2021-10-04 LAB — CULTURE, BLOOD (ROUTINE X 2)
Culture: NO GROWTH
Special Requests: ADEQUATE

## 2021-10-04 LAB — BASIC METABOLIC PANEL
Anion gap: 10 (ref 5–15)
BUN: 74 mg/dL — ABNORMAL HIGH (ref 8–23)
CO2: 27 mmol/L (ref 22–32)
Calcium: 8.6 mg/dL — ABNORMAL LOW (ref 8.9–10.3)
Chloride: 98 mmol/L (ref 98–111)
Creatinine, Ser: 2.12 mg/dL — ABNORMAL HIGH (ref 0.44–1.00)
GFR, Estimated: 22 mL/min — ABNORMAL LOW (ref >60)
Glucose, Bld: 240 mg/dL — ABNORMAL HIGH (ref 70–99)
Potassium: 3.7 mmol/L (ref 3.5–5.1)
Sodium: 135 mmol/L (ref 135–145)

## 2021-10-04 LAB — DIGOXIN LEVEL: Digoxin Level: 2 ng/mL (ref 0.8–2.0)

## 2021-10-04 LAB — VITAMIN B12: Vitamin B-12: 5293 pg/mL — ABNORMAL HIGH (ref 180–914)

## 2021-10-04 LAB — BRAIN NATRIURETIC PEPTIDE: B Natriuretic Peptide: 591.5 pg/mL — ABNORMAL HIGH (ref 0.0–100.0)

## 2021-10-04 MED ORDER — IOHEXOL 300 MG/ML  SOLN
10.0000 mL | Freq: Once | INTRAMUSCULAR | Status: AC | PRN
  Administered 2021-10-04: 10 mL

## 2021-10-04 MED ORDER — LIDOCAINE VISCOUS HCL 2 % MT SOLN
5.0000 mL | Freq: Once | OROMUCOSAL | Status: AC
  Administered 2021-10-04: 5 mL via OROMUCOSAL

## 2021-10-04 NOTE — Consult Note (Signed)
Ref: Jodi Marble, MD ? ? ?Subjective:  ?Awake.  ?Stable vital signs with holding diuresis. ?Non sustained VT episodes with dopamine drip at 6 mcg/kg/min. ?Left hand and arm swelling from left Subclavian vein thrombosis. ?Platelets count is very low. ?Renal function appears stable at 2.12 mg. Creatinine.  ?Making some urine. ? ?Objective:  ?Vital Signs in the last 24 hours: ? P:100, rare V-pacing, R: 22, BP: 120/71, O2 sat 97 %. ? ?Physical Exam: ?BP Readings from Last 1 Encounters:  ?08/06/21 (!) 90/58  ?   ?Wt Readings from Last 1 Encounters:  ?05/06/21 66.2 kg  ?  Weight change:  There is no height or weight on file to calculate BMI. ?HEENT: [place]/AT, Eyes-Brown, Conjunctiva-Pale, Sclera-Non-icteric ?Neck: No JVD, No bruit, Trachea midline. ?Lungs:  Clearing, Bilateral. ?Cardiac:  Regular rhythm, normal S1 and S2, no S3. II/VI systolic murmur. ?Abdomen:  Soft, non-tender. BS present. ?Extremities:  Trace edema present. No cyanosis. No clubbing. ?CNS: AxOx1, Cranial nerves grossly intact, moves all 4 extremities.  ?Skin: Warm and dry. ? ? ?Intake/Output from previous day: ?No intake/output data recorded. ? ? ? ?Lab Results: ?BMET ?   ?Component Value Date/Time  ? NA 135 10/04/2021 0420  ? NA 135 10/03/2021 0545  ? NA 136 10/02/2021 0335  ? NA 139 08/14/2019 0000  ? K 3.7 10/04/2021 0420  ? K 3.9 10/03/2021 0545  ? K 5.0 10/02/2021 0335  ? CL 98 10/04/2021 0420  ? CL 96 (L) 10/03/2021 0545  ? CL 100 10/02/2021 0335  ? CO2 27 10/04/2021 0420  ? CO2 26 10/03/2021 0545  ? CO2 24 10/02/2021 0335  ? GLUCOSE 240 (H) 10/04/2021 0420  ? GLUCOSE 351 (H) 10/03/2021 0545  ? GLUCOSE 222 (H) 10/02/2021 0335  ? BUN 74 (H) 10/04/2021 0420  ? BUN 71 (H) 10/03/2021 0545  ? BUN 85 (H) 10/02/2021 0335  ? BUN 40 (A) 08/14/2019 0000  ? CREATININE 2.12 (H) 10/04/2021 0420  ? CREATININE 2.14 (H) 10/03/2021 0545  ? CREATININE 2.74 (H) 10/02/2021 0335  ? CALCIUM 8.6 (L) 10/04/2021 0420  ? CALCIUM 8.5 (L) 10/03/2021 0545  ? CALCIUM 8.9  10/02/2021 0335  ? GFRNONAA 22 (L) 10/04/2021 0420  ? GFRNONAA 22 (L) 10/03/2021 0545  ? GFRNONAA 16 (L) 10/02/2021 0335  ? GFRAA >60 09/13/2019 1536  ? GFRAA >60 10/20/2016 0336  ? GFRAA >60 10/19/2016 0414  ? ?CBC ?   ?Component Value Date/Time  ? WBC 13.9 (H) 10/04/2021 0420  ? RBC 2.78 (L) 10/04/2021 0420  ? HGB 9.5 (L) 10/04/2021 0420  ? HCT 29.1 (L) 10/04/2021 0420  ? PLT 23 (LL) 10/04/2021 0420  ? MCV 104.7 (H) 10/04/2021 0420  ? MCH 34.2 (H) 10/04/2021 0420  ? MCHC 32.6 10/04/2021 0420  ? RDW 24.8 (H) 10/04/2021 0420  ? LYMPHSABS 0.2 (L) 09/29/2021 1738  ? MONOABS 0.7 09/29/2021 1738  ? EOSABS 0.0 09/29/2021 1738  ? BASOSABS 0.1 09/29/2021 1738  ? ?HEPATIC Function Panel ?Recent Labs  ?  09/29/21 ?1738 10/01/21 ?0448 10/02/21 ?0335  ?PROT 4.6* 5.7* 5.6*  ? ?HEMOGLOBIN A1C ?No components found for: HGA1C,  MPG ?CARDIAC ENZYMES ?No results found for: CKTOTAL, CKMB, CKMBINDEX, TROPONINI ?BNP ?No results for input(s): PROBNP in the last 8760 hours. ?TSH ?No results for input(s): TSH in the last 8760 hours. ?CHOLESTEROL ?No results for input(s): CHOL in the last 8760 hours. ? ?Scheduled Meds: ?Continuous Infusions: ?PRN Meds:. ? ?Assessment/Plan: ? Acute on chronic respiratory failure with hypoxia ?Non-small cell  lung CA with metastasis ?Chronic systolic left heart failure ?Acute renal failure ?CAD ?Type 2 DM ?HTN ?Chronic atrial fibrillation ?Left subclavian vein thrombosis ?Severe thrombocytopenia ? ?Plan: ?NG feeding as tolerated. ?Hold Diuresis till able to wean off dopamine. ? ? LOS: 0 days  ? ?Time spent including chart review, lab review, examination, discussion with patient/Nurse : 30 min ? ? ?Dixie Dials  MD  ?10/04/2021, 9:42 PM ? ? ? ? ?

## 2021-10-04 NOTE — Progress Notes (Signed)
Upper extremity venous LT study completed. ? ?Preliminary results relayed to Owens Shark, MD. ? ?See CV Proc for preliminary results report.  ? ?Darlin Coco, RDMS, RVT ? ?

## 2021-10-05 LAB — BASIC METABOLIC PANEL
Anion gap: 14 (ref 5–15)
BUN: 82 mg/dL — ABNORMAL HIGH (ref 8–23)
CO2: 24 mmol/L (ref 22–32)
Calcium: 8.2 mg/dL — ABNORMAL LOW (ref 8.9–10.3)
Chloride: 93 mmol/L — ABNORMAL LOW (ref 98–111)
Creatinine, Ser: 2.92 mg/dL — ABNORMAL HIGH (ref 0.44–1.00)
GFR, Estimated: 15 mL/min — ABNORMAL LOW (ref >60)
Glucose, Bld: 326 mg/dL — ABNORMAL HIGH (ref 70–99)
Potassium: 3.9 mmol/L (ref 3.5–5.1)
Sodium: 131 mmol/L — ABNORMAL LOW (ref 135–145)

## 2021-10-05 LAB — CBC
HCT: 28.8 % — ABNORMAL LOW (ref 36.0–46.0)
Hemoglobin: 9.1 g/dL — ABNORMAL LOW (ref 12.0–15.0)
MCH: 33.7 pg (ref 26.0–34.0)
MCHC: 31.6 g/dL (ref 30.0–36.0)
MCV: 106.7 fL — ABNORMAL HIGH (ref 80.0–100.0)
Platelets: 15 10*3/uL — CL (ref 150–400)
RBC: 2.7 MIL/uL — ABNORMAL LOW (ref 3.87–5.11)
RDW: 25.6 % — ABNORMAL HIGH (ref 11.5–15.5)
WBC: 10.9 10*3/uL — ABNORMAL HIGH (ref 4.0–10.5)
nRBC: 4.6 % — ABNORMAL HIGH (ref 0.0–0.2)

## 2021-10-05 LAB — VANCOMYCIN, TROUGH: Vancomycin Tr: 8 ug/mL — ABNORMAL LOW (ref 15–20)

## 2021-10-05 NOTE — Consult Note (Addendum)
Infectious Disease Consultation   [place]  OAC:[id number]  DOB: 11-06-1931  DOA: 09/29/2021  Requesting physician: Dr. Manson Passey  Reason for consultation: Antibiotic Recommendations  History of Present Illness: April Park is an 88 y.o. female with multiple past medical problems including history of non-small cell lung cancer with metastasis, chronic systolic congestive heart failure, atrial fibrillation, nonischemic cardiomyopathy, emphysema, chronic kidney disease, diabetes mellitus, hypertension, hyperlipidemia who initially presented to [facility] complaining of worsening shortness of breath.  Patient reportedly was requiring BiPAP.  She had a diagnosis of non-small cell lung cancer with metastasis to the abdomen and was started on radiation therapy.  She was found to have recurrent right-sided pleural effusion.  Thoracentesis was done and 500 cc was removed by IR on 09/08/2021.  Repeat chest x-ray on 09/12/2021 showed persistent right hemithorax opacification with pleural effusion.  There was insufficient fluid for thoracentesis.  Patient was treated with 2 weeks of Augmentin.  Regarding her congestive heart failure her dose of carvedilol was decreased today due to the blood pressure being on the lower side.  She was treated with Lasix.  Eventually carvedilol was changed to metoprolol and to Toprol-XL.  She also has chronic atrial fibrillation and was on Eliquis, digoxin and beta-blockers.  She was hospitalized through 08/29/2021 through 09/20/2021.  She had hyperkalemia and her usual medications including Entresto and Aldactone were discontinued.  After discharge she continued to have worsening shortness of breath therefore was brought and admitted to [facility].  After admission here she had a CT of the chest/abdomen/pelvis which showed stable innumerable scattered pulmonary nodules, complete collapse of the right middle lobe and lower lobes with interval  worsening of the partial collapse of the right upper lobe, small to moderate pleural effusion on the right.  She had blood cultures collected on 09/29/2021 that showed strep mitis in 1 set. She was also found to be in acute renal failure.   Review of Systems:  As per HPI otherwise 10 point review of systems negative.    Past Medical History: Past Medical History:  Diagnosis Date   A-fib (HCC)    failed amio due to Gi side effects (in [place])    CAD (coronary artery disease)    non-obstructive CAD on cath January 2012. mLAD 40-50%   Chest pain    CHF (congestive heart failure) (HCC)    due to recurrent NICM echo 06/2010: EF 20-25% mod-severe MR, moderate TR   Diabetes mellitus    GI bleed    Hyperlipidemia    Hypertension    S/P atrioventricular nodal ablation     Past Surgical History: Past Surgical History:  Procedure Laterality Date   CARDIAC CATHETERIZATION     COLONOSCOPY WITH PROPOFOL N/A 11/07/2016   Procedure: COLONOSCOPY WITH PROPOFOL;  Surgeon: Wyline Mood, MD;  Location: [facility] ENDOSCOPY;  Service: Endoscopy;  Laterality: N/A;   ESOPHAGOGASTRODUODENOSCOPY (EGD) WITH PROPOFOL N/A 10/20/2016   Procedure: ESOPHAGOGASTRODUODENOSCOPY (EGD) WITH PROPOFOL;  Surgeon: Wyline Mood, MD;  Location: [facility] ENDOSCOPY;  Service: Endoscopy;  Laterality: N/A;   GIVENS CAPSULE STUDY N/A 11/07/2016   Procedure: GIVENS CAPSULE STUDY;  Surgeon: Wyline Mood, MD;  Location: [facility] ENDOSCOPY;  Service: Endoscopy;  Laterality: N/A;   KNEE ARTHROSCOPY     2009     Allergies:   Allergies  Allergen Reactions   Ace Inhibitors Cough   Amiodarone Nausea Only and Nausea And Vomiting    Has  tried med on 2 different occasions with same reaction    Levaquin [Levofloxacin] Nausea And Vomiting     Social History:  reports that she has never smoked. She has never used smokeless tobacco. She reports that she does not drink alcohol and does not use drugs.   Family History: Family History  Problem  Relation Age of Onset   Arrhythmia Mother 46   Heart failure Mother        diastolic dysfunction   Diabetes Mother    Diabetes Sister        family history   Hypertension Other        family history   Diabetes Brother     Physical Exam: Vitals: Temperature 98.5, pulse 90, respiratory rate 30, blood pressure 102/53, oxygen saturation 97%  Constitutional: ill appearing, awake Head: Atraumatic, normocephalic Eyes: PERLA, EOMI  ENMT: external ears and nose appear normal, DHT in place, normal hearing, moist oral mucosa Neck: Supple CVS: Irregularly irregular Respiratory: Decreased breath sounds lower lobes, occasional rhonchi, no wheezing Abdomen: soft nontender, nondistended, normal bowel sounds Musculoskeletal: edema Neuro: Debility with generalized weakness Psych: No agitation Skin: no rashes    Data reviewed:  I have personally reviewed following labs and imaging studies Labs:  CBC: Recent Labs  Lab 09/29/21 1738 10/01/21 0448 10/02/21 0335 10/02/21 1437 10/03/21 0545 10/04/21 0420 10/05/21 0540  WBC 14.7*   < > 10.1 10.5 12.0* 13.9* 10.9*  NEUTROABS 13.0*  --   --   --   --   --   --   HGB 9.8*   < > 9.1* 8.6* 8.8* 9.5* 9.1*  HCT 31.0*   < > 28.1* 27.4* 27.1* 29.1* 28.8*  MCV 105.1*   < > 104.1* 107.0* 105.4* 104.7* 106.7*  PLT 16*   < > <5* 67*  62* 38* 23* 15*   < > = values in this interval not displayed.    Basic Metabolic Panel: Recent Labs  Lab 09/29/21 1738 09/29/21 1951 09/30/21 0456 10/01/21 0448 10/02/21 0335 10/03/21 0545 10/04/21 0420 10/05/21 0540  NA 128*  --    < > 130* 136 135 135 131*  K 5.3*  --    < > 4.8 5.0 3.9 3.7 3.9  CL 95*  --    < > 96* 100 96* 98 93*  CO2 25  --    < > 26 24 26 27 24   GLUCOSE 501*  --    < > 111* 222* 351* 240* 326*  BUN 99*  --    < > 97* 85* 71* 74* 82*  CREATININE 3.16*  --    < > 3.08* 2.74* 2.14* 2.12* 2.92*  CALCIUM 8.0*  --    < > 8.7* 8.9 8.5* 8.6* 8.2*  MG 2.6*  --   --   --   --  2.0  --   --    PHOS  --  2.8  --   --   --   --   --   --    < > = values in this interval not displayed.   GFR CrCl cannot be calculated (Unknown ideal weight.). Liver Function Tests: Recent Labs  Lab 09/29/21 1738 10/01/21 0448 10/02/21 0335  AST 68* 76* 68*  ALT 55* 69* 57*  ALKPHOS 234* 210* 186*  BILITOT 1.6* 2.8* 2.7*  PROT 4.6* 5.7* 5.6*  ALBUMIN 2.2* 3.1* 3.6   No results for input(s): LIPASE, AMYLASE in the last 168 hours. Recent Labs  Lab 10/02/21 0335  AMMONIA 45*   Coagulation profile Recent Labs  Lab 10/02/21 1437  INR 1.3*    Cardiac Enzymes: No results for input(s): CKTOTAL, CKMB, CKMBINDEX, TROPONINI in the last 168 hours. BNP: Invalid input(s): POCBNP CBG: No results for input(s): GLUCAP in the last 168 hours. D-Dimer Recent Labs    10/02/21 1437  DDIMER 15.59*   Hgb A1c No results for input(s): HGBA1C in the last 72 hours. Lipid Profile No results for input(s): CHOL, HDL, LDLCALC, TRIG, CHOLHDL, LDLDIRECT in the last 72 hours. Thyroid function studies No results for input(s): TSH, T4TOTAL, T3FREE, THYROIDAB in the last 72 hours.  Invalid input(s): FREET3 Anemia work up Recent Labs    10/03/21 1018 10/04/21 0420  VITAMINB12 4,515* 5,293*   Urinalysis    Component Value Date/Time   APPEARANCEUR Clear 09/02/2019 1601   GLUCOSEU 2+ (A) 09/02/2019 1601   BILIRUBINUR Negative 09/02/2019 1601   PROTEINUR Negative 09/02/2019 1601   NITRITE Negative 09/02/2019 1601   LEUKOCYTESUR Negative 09/02/2019 1601     Sepsis Labs Invalid input(s): PROCALCITONIN,  WBC,  LACTICIDVEN Microbiology Recent Results (from the past 240 hour(s))  Culture, blood (Routine X 2) w Reflex to ID Panel     Status: None   Collection Time: 09/29/21  8:36 PM   Specimen: BLOOD RIGHT HAND  Result Value Ref Range Status   Specimen Description BLOOD RIGHT HAND  Final   Special Requests   Final    BOTTLES DRAWN AEROBIC ONLY Blood Culture adequate volume   Culture   Final     NO GROWTH 5 DAYS Performed at [facility], [address], [place], [place] [address]    Report Status 10/04/2021 FINAL  Final  Culture, blood (Routine X 2) w Reflex to ID Panel     Status: Abnormal   Collection Time: 09/29/21  8:44 PM   Specimen: BLOOD LEFT HAND  Result Value Ref Range Status   Specimen Description BLOOD LEFT HAND  Final   Special Requests IN PEDIATRIC BOTTLE Blood Culture adequate volume  Final   Culture  Setup Time   Final    GRAM POSITIVE COCCI IN CHAINS IN PEDIATRIC BOTTLE CRITICAL RESULT CALLED TO, READ BACK BY AND VERIFIED WITH: RN Odelia Gage [phone] @1555  FH Performed at [facility], [address] [place], [place] [address]    Culture STREPTOCOCCUS MITIS/ORALIS (A)  Final   Report Status 10/02/2021 FINAL  Final   Organism ID, Bacteria STREPTOCOCCUS MITIS/ORALIS  Final      Susceptibility   Streptococcus mitis/oralis - MIC*    TETRACYCLINE >=16 RESISTANT Resistant     VANCOMYCIN <=0.12 SENSITIVE Sensitive     CLINDAMYCIN <=0.25 SENSITIVE Sensitive     PENICILLIN Value in next row Intermediate      INTERMEDIATE1.0    * STREPTOCOCCUS MITIS/ORALIS  Blood Culture ID Panel (Reflexed)     Status: Abnormal   Collection Time: 09/29/21  8:44 PM  Result Value Ref Range Status   Enterococcus faecalis NOT DETECTED NOT DETECTED Final   Enterococcus Faecium NOT DETECTED NOT DETECTED Final   Listeria monocytogenes NOT DETECTED NOT DETECTED Final   Staphylococcus species NOT DETECTED NOT DETECTED Final   Staphylococcus aureus (BCID) NOT DETECTED NOT DETECTED Final   Staphylococcus epidermidis NOT DETECTED NOT DETECTED Final   Staphylococcus lugdunensis NOT DETECTED NOT DETECTED Final   Streptococcus species DETECTED (A) NOT DETECTED Final    Comment: Not Enterococcus species, Streptococcus agalactiae, Streptococcus pyogenes, or Streptococcus  pneumoniae. CRITICAL RESULT CALLED TO, READ BACK BY AND VERIFIED WITH: RN C. Riki Sheer [phone] @1555  FH     Streptococcus agalactiae NOT DETECTED NOT DETECTED Final   Streptococcus pneumoniae NOT DETECTED NOT DETECTED Final   Streptococcus pyogenes NOT DETECTED NOT DETECTED Final   A.calcoaceticus-baumannii NOT DETECTED NOT DETECTED Final   Bacteroides fragilis NOT DETECTED NOT DETECTED Final   Enterobacterales NOT DETECTED NOT DETECTED Final   Enterobacter cloacae complex NOT DETECTED NOT DETECTED Final   Escherichia coli NOT DETECTED NOT DETECTED Final   Klebsiella aerogenes NOT DETECTED NOT DETECTED Final   Klebsiella oxytoca NOT DETECTED NOT DETECTED Final   Klebsiella pneumoniae NOT DETECTED NOT DETECTED Final   Proteus species NOT DETECTED NOT DETECTED Final   Salmonella species NOT DETECTED NOT DETECTED Final   Serratia marcescens NOT DETECTED NOT DETECTED Final   Haemophilus influenzae NOT DETECTED NOT DETECTED Final   Neisseria meningitidis NOT DETECTED NOT DETECTED Final   Pseudomonas aeruginosa NOT DETECTED NOT DETECTED Final   Stenotrophomonas maltophilia NOT DETECTED NOT DETECTED Final   Candida albicans NOT DETECTED NOT DETECTED Final   Candida auris NOT DETECTED NOT DETECTED Final   Candida glabrata NOT DETECTED NOT DETECTED Final   Candida krusei NOT DETECTED NOT DETECTED Final   Candida parapsilosis NOT DETECTED NOT DETECTED Final   Candida tropicalis NOT DETECTED NOT DETECTED Final   Cryptococcus neoformans/gattii NOT DETECTED NOT DETECTED Final    Comment: Performed at [facility], [address], [place], [place] [address]    Inpatient Medications:   Scheduled Meds: Please see MAR   Radiological Exams on Admission: DG Abd 1 View  Result Date: 10/04/2021 CLINICAL DATA:  Enteric tube placement EXAM: ABDOMEN - 1 VIEW COMPARISON:  None. FINDINGS: Tip of enteric tube is seen in the region of distal antrum of the stomach. Contrast injected through the enteric tube is noted in the lumen of the stomach and proximal duodenum. There is opacification of right hemithorax.  IMPRESSION: Tip of enteric tube is seen in the distal antrum of the stomach. Electronically Signed   By: Ernie Avena M.D.   On: 10/04/2021 13:54   DG CHEST PORT 1 VIEW  Result Date: 10/04/2021 CLINICAL DATA:  Pleural effusion.  Evaluate infiltrate. EXAM: PORTABLE CHEST 1 VIEW COMPARISON:  10/03/2021 FINDINGS: There is a left chest wall ICD with leads in the right atrial appendage and right ventricle. Right arm PICC line tip terminates in the right atrium. Unchanged complete opacification of the right hemithorax reflecting underlying pleural effusion and large central right lung mass. No new changes since the previous exam. IMPRESSION: 1. No change complete opacification of the right lung compared with previous exam reflecting underlying pleural effusion and large right lung mass. Electronically Signed   By: Signa Kell M.D.   On: 10/04/2021 06:12   VAS [place] UPPER EXTREMITY VENOUS DUPLEX  Result Date: 10/04/2021 UPPER VENOUS STUDY  Patient Name:  April Park  Date of Exam:   10/04/2021 Medical Rec #: [id number]    Accession #:    [id number] Date of Birth: 08/01/1931     Patient Gender: F Patient Age:   15 years Exam Location:  [facility] Procedure:      VAS [place] UPPER EXTREMITY VENOUS DUPLEX Referring Phys: Judeth Cornfield BROWN --------------------------------------------------------------------------------  Indications: Swelling and pain left arm Comparison Study: No prior studies. Performing Technologist: Jean Rosenthal RDMS, RVT  Examination Guidelines: A complete evaluation includes B-mode imaging, spectral Doppler, color Doppler, and power Doppler as  needed of all accessible portions of each vessel. Bilateral testing is considered an integral part of a complete examination. Limited examinations for reoccurring indications may be performed as noted.  Right Findings: +----------+------------+---------+-----------+----------+-------+ RIGHT     CompressiblePhasicitySpontaneousPropertiesSummary  +----------+------------+---------+-----------+----------+-------+ Subclavian               Yes       Yes                      +----------+------------+---------+-----------+----------+-------+  Left Findings: +----------+------------+---------+-----------+----------+-----------------+ LEFT      CompressiblePhasicitySpontaneousProperties     Summary      +----------+------------+---------+-----------+----------+-----------------+ Subclavian  Partial      Yes       Yes              Age Indeterminate +----------+------------+---------+-----------+----------+-----------------+ Axillary      Full       Yes       Yes                                +----------+------------+---------+-----------+----------+-----------------+ Brachial      Full                                                    +----------+------------+---------+-----------+----------+-----------------+ Radial        Full                                                    +----------+------------+---------+-----------+----------+-----------------+ Ulnar         Full                                                    +----------+------------+---------+-----------+----------+-----------------+ Cephalic      Full                                                    +----------+------------+---------+-----------+----------+-----------------+ Basilic       Full                                                    +----------+------------+---------+-----------+----------+-----------------+  Summary:  Right: No evidence of thrombosis in the subclavian.  Left: No evidence of superficial vein thrombosis in the upper extremity. Findings consistent with age indeterminate deep vein thrombosis involving the left subclavian vein.  *See table(s) above for measurements and observations.  Diagnosing physician: Lemar Livings MD Electronically signed by Lemar Livings MD on 10/04/2021 at 5:34:21 PM.    Final      Impression/Recommendations Active Problems: Acute on chronic hypoxemic respiratory failure Bacteremia with Streptococcus with concern for sepsis Leukocytosis Thrombocytopenia Recurrent pleural effusion Acute on chronic congestive heart failure Acute on chronic renal failure Diabetes mellitus, uncontrolled Chronic  atrial fibrillation Metastatic lung cancer Left subclavian thrombosis  Acute on chronic hypoxemic respiratory failure: She has metastatic lung cancer and has recurrent pleural effusion.  CT of the chest abdomen and pelvis at the time of admission showing complete collapse of the right middle lobe, right lower lobe with interval worsening and partial collapse of the right upper lobe with innumerable pulmonary nodules.  Chest x-ray on 10/04/2021 unfortunately does not show much change in the complete opacification of the right lung.  Unfortunately due to her underlying lung cancer she is high risk for worsening respiratory failure despite treatment.  She also had pleural effusion and underwent thoracentesis at the acute facility. Continue management per primary team, pulmonary.    Bacteremia with Streptococcus: She had blood cultures collected on 09/29/2021 that showed Streptococcus mitis in 1 set.  This is a oral bacteria.  This is intermediate to penicillin.  She received treatment with 1 dose of IV vancomycin and has been on treatment with clindamycin.  2D echocardiogram does not show any valvular vegetations.  Doubt this is endocarditis because bacteria was only in 1 set.  However, she has defibrillator and has multiple leads and therefore unable to completely exclude possibility of endocarditis.  Regardless, she is not stable enough to get a transesophageal echocardiogram at this time.  She also has cancer which is metastatic and high risk for recurrent bacteremia.  Will order repeat blood cultures.  Clindamycin is ordered for 1 week.  Reluctant to do a prolonged course with the  clindamycin because of high risk for C. difficile.  Will add ceftriaxone.  Will plan to treat for duration of 2 to 3 weeks at this time.  May need to extend further pending patient response.  Please monitor CBC, CMP closely while on antibiotics.  She also has a PICC line in place.  Ideally would recommend line removal.  However, currently on dopamine and needs the line access for this.  If she is continuing to be persistently bacteremic then we may have no option but to remove the line.  Leukocytosis/thrombocytopenia: She was initially found to have leukocytosis.  WBC count improving.  She also has thrombocytopenia and has received platelet transfusion in the past.  Exact etiology for the thrombocytopenia is unclear at this time.  Antibiotics as mentioned above.  Further management per the primary team.  Acute on chronic congestive heart failure: She likely has combined systolic/diastolic congestive heart failure.  Previously had low EF.  Recent echocardiogram showing EF 50-55% but showed dilation of the left atrium, right atrium.  She was on digoxin which is on hold at this time.  She also has chronic atrial fibrillation and was on Eliquis which has been held by the primary team due to her thrombocytopenia.  Further management per cardiology and primary team.  Acute on chronic renal failure: Patient on Lasix drip, also on Dopamine per nephrology.  Continue management per nephrology and primary team.  Antibiotics renally dosed.  Diabetes mellitus: Continue to monitor Accu-Cheks, management of diabetes per the primary team.  Metastatic lung cancer: Patient supposedly was on radiation.  Continue follow-up with hematology/oncology.  Left subclavian thrombosis: Management per the primary team.  Due to her complex medical problems she is very high risk for worsening and decompensation.   Thank you for this consultation.  Plan of care discussed with patient's son at the bedside and with the primary team and  pharmacy.  Vonzella Nipple M.D. 10/05/2021, 2:18 PM

## 2021-10-05 NOTE — Consult Note (Signed)
Ref: Jodi Marble, MD ? ? ?Subjective:  ?April Park. ?Mild tachyarrhythmia with dopamine drip. ?Back on IV lasix drip without significant urine output. ?Low sodium with elevated sugar levels. ? ?Objective:  ?Vital Signs in the last 24 hours: ?  ? ?Physical Exam: ?BP Readings from Last 1 Encounters:  ?08/06/21 (!) 90/58  ?   ?Wt Readings from Last 1 Encounters:  ?05/06/21 66.2 kg  ?  Weight change:  There is no height or weight on file to calculate BMI. ?HEENT: [place]/AT, Eyes-Brown, Conjunctiva-Pale, Sclera-Non-icteric ?Neck: No JVD, No bruit, Trachea midline. ?Lungs:  Clearing, Bilateral. ?Cardiac:  Irregular rhythm, normal S1 and S2, no S3. II/VI systolic murmur. ?Abdomen:  Soft, non-tender. BS present. ?Extremities:  Trace edema present. No cyanosis. No clubbing. ?CNS: AxOx1, Cranial nerves grossly intact, limited motion of all 4 extremities.  ?Skin: Warm and dry. ? ? ?Intake/Output from previous day: ?No intake/output data recorded. ? ? ? ?Lab Results: ?BMET ?   ?Component Value Date/Time  ? NA 131 (L) 10/05/2021 0540  ? NA 135 10/04/2021 0420  ? NA 135 10/03/2021 0545  ? NA 139 08/14/2019 0000  ? K 3.9 10/05/2021 0540  ? K 3.7 10/04/2021 0420  ? K 3.9 10/03/2021 0545  ? CL 93 (L) 10/05/2021 0540  ? CL 98 10/04/2021 0420  ? CL 96 (L) 10/03/2021 0545  ? CO2 24 10/05/2021 0540  ? CO2 27 10/04/2021 0420  ? CO2 26 10/03/2021 0545  ? GLUCOSE 326 (H) 10/05/2021 0540  ? GLUCOSE 240 (H) 10/04/2021 0420  ? GLUCOSE 351 (H) 10/03/2021 0545  ? BUN 82 (H) 10/05/2021 0540  ? BUN 74 (H) 10/04/2021 0420  ? BUN 71 (H) 10/03/2021 0545  ? BUN 40 (A) 08/14/2019 0000  ? CREATININE 2.92 (H) 10/05/2021 0540  ? CREATININE 2.12 (H) 10/04/2021 0420  ? CREATININE 2.14 (H) 10/03/2021 0545  ? CALCIUM 8.2 (L) 10/05/2021 0540  ? CALCIUM 8.6 (L) 10/04/2021 0420  ? CALCIUM 8.5 (L) 10/03/2021 0545  ? GFRNONAA 15 (L) 10/05/2021 0540  ? GFRNONAA 22 (L) 10/04/2021 0420  ? GFRNONAA 22 (L) 10/03/2021 0545  ? GFRAA >60 09/13/2019 1536  ? GFRAA >60  10/20/2016 0336  ? GFRAA >60 10/19/2016 0414  ? ?CBC ?   ?Component Value Date/Time  ? WBC 10.9 (H) 10/05/2021 0540  ? RBC 2.70 (L) 10/05/2021 0540  ? HGB 9.1 (L) 10/05/2021 0540  ? HCT 28.8 (L) 10/05/2021 0540  ? PLT 15 (LL) 10/05/2021 0540  ? MCV 106.7 (H) 10/05/2021 0540  ? MCH 33.7 10/05/2021 0540  ? MCHC 31.6 10/05/2021 0540  ? RDW 25.6 (H) 10/05/2021 0540  ? LYMPHSABS 0.2 (L) 09/29/2021 1738  ? MONOABS 0.7 09/29/2021 1738  ? EOSABS 0.0 09/29/2021 1738  ? BASOSABS 0.1 09/29/2021 1738  ? ?HEPATIC Function Panel ?Recent Labs  ?  09/29/21 ?1738 10/01/21 ?0448 10/02/21 ?0335  ?PROT 4.6* 5.7* 5.6*  ? ?HEMOGLOBIN A1C ?No components found for: HGA1C,  MPG ?CARDIAC ENZYMES ?No results found for: CKTOTAL, CKMB, CKMBINDEX, TROPONINI ?BNP ?No results for input(s): PROBNP in the last 8760 hours. ?TSH ?No results for input(s): TSH in the last 8760 hours. ?CHOLESTEROL ?No results for input(s): CHOL in the last 8760 hours. ? ?Scheduled Meds: ?Continuous Infusions: ?PRN Meds:. ? ?Assessment/Plan: ? Acute on chronic respiratory failure with hypoxia ?Non-small cell lung CA with metastasis ?Chronic systolic left heart failure ?Acute renal failure ?CAD ?Type 2 DM ?HTN ?Chronic atrial fibrillation ?Left subclavian vein thrombosis ?Severe thrombocytopenia ? ?Plan: ?Tolerating 300  cc/hr of NG feeding. ?Patient is relatively hypovolemic intravascularly as such diuretic may not work. ?Check magnesium level and digoxin level with CBC and BMET in AM. ? ? ? LOS: 0 days  ? ?Time spent including chart review, lab review, examination, discussion with patient/Nurse/Family : 30 min ? ? ?Dixie Dials  MD  ?10/05/2021, 9:05 PM ? ? ?  ?

## 2021-10-06 LAB — CBC WITH DIFFERENTIAL/PLATELET
Abs Immature Granulocytes: 0.48 10*3/uL — ABNORMAL HIGH (ref 0.00–0.07)
Basophils Absolute: 0 10*3/uL (ref 0.0–0.1)
Basophils Relative: 0 %
Eosinophils Absolute: 0 10*3/uL (ref 0.0–0.5)
Eosinophils Relative: 0 %
HCT: 27.8 % — ABNORMAL LOW (ref 36.0–46.0)
Hemoglobin: 9.2 g/dL — ABNORMAL LOW (ref 12.0–15.0)
Immature Granulocytes: 4 %
Lymphocytes Relative: 2 %
Lymphs Abs: 0.3 10*3/uL — ABNORMAL LOW (ref 0.7–4.0)
MCH: 34.8 pg — ABNORMAL HIGH (ref 26.0–34.0)
MCHC: 33.1 g/dL (ref 30.0–36.0)
MCV: 105.3 fL — ABNORMAL HIGH (ref 80.0–100.0)
Monocytes Absolute: 0.7 10*3/uL (ref 0.1–1.0)
Monocytes Relative: 5 %
Neutro Abs: 12 10*3/uL — ABNORMAL HIGH (ref 1.7–7.7)
Neutrophils Relative %: 89 %
Platelets: 10 10*3/uL — CL (ref 150–400)
RBC: 2.64 MIL/uL — ABNORMAL LOW (ref 3.87–5.11)
RDW: 26 % — ABNORMAL HIGH (ref 11.5–15.5)
WBC: 13.5 10*3/uL — ABNORMAL HIGH (ref 4.0–10.5)
nRBC: 4.7 % — ABNORMAL HIGH (ref 0.0–0.2)

## 2021-10-06 LAB — BASIC METABOLIC PANEL
Anion gap: 13 (ref 5–15)
BUN: 92 mg/dL — ABNORMAL HIGH (ref 8–23)
CO2: 23 mmol/L (ref 22–32)
Calcium: 8.2 mg/dL — ABNORMAL LOW (ref 8.9–10.3)
Chloride: 94 mmol/L — ABNORMAL LOW (ref 98–111)
Creatinine, Ser: 3.3 mg/dL — ABNORMAL HIGH (ref 0.44–1.00)
GFR, Estimated: 13 mL/min — ABNORMAL LOW (ref >60)
Glucose, Bld: 249 mg/dL — ABNORMAL HIGH (ref 70–99)
Potassium: 4.5 mmol/L (ref 3.5–5.1)
Sodium: 130 mmol/L — ABNORMAL LOW (ref 135–145)

## 2021-10-06 LAB — DIGOXIN LEVEL: Digoxin Level: 1.6 ng/mL (ref 0.8–2.0)

## 2021-10-06 LAB — MAGNESIUM: Magnesium: 2.1 mg/dL (ref 1.7–2.4)

## 2021-10-06 NOTE — Consult Note (Signed)
Ref: Jodi Marble, MD ? ? ?Subjective:  ?Moaning. C/O palpitation. HR in 100-110 with frequent VPCs and occasional NSVT. ?Significant hypotension with attempts to drop dopamine from 6 mcg to 5 mcg/min.Marland Kitchen ?Magnesium and digoxin levels are normal. ? ?Objective:  ?Vital Signs in the last 24 hours: ?  ? ?Physical Exam: ?BP Readings from Last 1 Encounters:  ?08/06/21 (!) 90/58  ?   ?Wt Readings from Last 1 Encounters:  ?05/06/21 66.2 kg  ?  Weight change:  There is no height or weight on file to calculate BMI. ?HEENT: [place]/AT, Eyes-Brown, Conjunctiva-Pale, Sclera-Non-icteric ?Neck: No JVD, No bruit, Trachea midline. ?Lungs:  Clearing, Bilateral. ?Cardiac:  Irregular rhythm, normal S1 and S2, no S3. II/VI systolic murmur. ?Abdomen:  Soft, non-tender. BS present. ?Extremities:  Trace edema present. No cyanosis. No clubbing. ?CNS: AxOx1, Cranial nerves grossly intact. ?Skin: Warm and dry. ? ? ?Intake/Output from previous day: ?No intake/output data recorded. ? ? ? ?Lab Results: ?BMET ?   ?Component Value Date/Time  ? NA 130 (L) 10/06/2021 8315  ? NA 131 (L) 10/05/2021 0540  ? NA 135 10/04/2021 0420  ? NA 139 08/14/2019 0000  ? K 4.5 10/06/2021 0620  ? K 3.9 10/05/2021 0540  ? K 3.7 10/04/2021 0420  ? CL 94 (L) 10/06/2021 1761  ? CL 93 (L) 10/05/2021 0540  ? CL 98 10/04/2021 0420  ? CO2 23 10/06/2021 0620  ? CO2 24 10/05/2021 0540  ? CO2 27 10/04/2021 0420  ? GLUCOSE 249 (H) 10/06/2021 6073  ? GLUCOSE 326 (H) 10/05/2021 0540  ? GLUCOSE 240 (H) 10/04/2021 0420  ? BUN 92 (H) 10/06/2021 7106  ? BUN 82 (H) 10/05/2021 0540  ? BUN 74 (H) 10/04/2021 0420  ? BUN 40 (A) 08/14/2019 0000  ? CREATININE 3.30 (H) 10/06/2021 2694  ? CREATININE 2.92 (H) 10/05/2021 0540  ? CREATININE 2.12 (H) 10/04/2021 0420  ? CALCIUM 8.2 (L) 10/06/2021 8546  ? CALCIUM 8.2 (L) 10/05/2021 0540  ? CALCIUM 8.6 (L) 10/04/2021 0420  ? GFRNONAA 13 (L) 10/06/2021 2703  ? GFRNONAA 15 (L) 10/05/2021 0540  ? GFRNONAA 22 (L) 10/04/2021 0420  ? GFRAA >60 09/13/2019  1536  ? GFRAA >60 10/20/2016 0336  ? GFRAA >60 10/19/2016 0414  ? ?CBC ?   ?Component Value Date/Time  ? WBC 13.5 (H) 10/06/2021 5009  ? RBC 2.64 (L) 10/06/2021 3818  ? HGB 9.2 (L) 10/06/2021 2993  ? HCT 27.8 (L) 10/06/2021 7169  ? PLT 10 (LL) 10/06/2021 6789  ? MCV 105.3 (H) 10/06/2021 3810  ? MCH 34.8 (H) 10/06/2021 1751  ? MCHC 33.1 10/06/2021 0620  ? RDW 26.0 (H) 10/06/2021 0258  ? LYMPHSABS 0.3 (L) 10/06/2021 5277  ? MONOABS 0.7 10/06/2021 0620  ? EOSABS 0.0 10/06/2021 0620  ? BASOSABS 0.0 10/06/2021 0620  ? ?HEPATIC Function Panel ?Recent Labs  ?  09/29/21 ?1738 10/01/21 ?0448 10/02/21 ?0335  ?PROT 4.6* 5.7* 5.6*  ? ?HEMOGLOBIN A1C ?No components found for: HGA1C,  MPG ?CARDIAC ENZYMES ?No results found for: CKTOTAL, CKMB, CKMBINDEX, TROPONINI ?BNP ?No results for input(s): PROBNP in the last 8760 hours. ?TSH ?No results for input(s): TSH in the last 8760 hours. ?CHOLESTEROL ?No results for input(s): CHOL in the last 8760 hours. ? ?Scheduled Meds: ?Continuous Infusions: ?PRN Meds:. ? ?Assessment/Plan: ? Acute on chronic respiratory failure with hypoxia ?Non-small cell lung CA with metastasis ?Chronic systolic left heart failure ?Non Sustained VT ?CAD ?Acute renal failure ?Type 2 DM ?HTN ?Chromic atrial fibrillation ?Left subclavian  vein thrombosis ?Severe thrombocytopenia ? ?Plan: ?Continue supportive care ? ? LOS: 0 days  ? ?Time spent including chart review, lab review, examination, discussion with patient/Family : 30 min ? ? ?Dixie Dials  MD  ?10/06/2021, 11:20 PM ? ? ? ? ?

## 2021-10-07 ENCOUNTER — Inpatient Hospital Stay (HOSPITAL_COMMUNITY)
Admission: AD | Admit: 2021-10-07 | Discharge: 2021-10-18 | DRG: 871 | Disposition: E | Payer: Medicare Other | Source: Skilled Nursing Facility | Attending: Pulmonary Disease | Admitting: Pulmonary Disease

## 2021-10-07 ENCOUNTER — Inpatient Hospital Stay (HOSPITAL_COMMUNITY): Payer: Medicare Other

## 2021-10-07 DIAGNOSIS — Z515 Encounter for palliative care: Secondary | ICD-10-CM

## 2021-10-07 DIAGNOSIS — I130 Hypertensive heart and chronic kidney disease with heart failure and stage 1 through stage 4 chronic kidney disease, or unspecified chronic kidney disease: Secondary | ICD-10-CM | POA: Diagnosis present

## 2021-10-07 DIAGNOSIS — Z7984 Long term (current) use of oral hypoglycemic drugs: Secondary | ICD-10-CM

## 2021-10-07 DIAGNOSIS — A408 Other streptococcal sepsis: Secondary | ICD-10-CM | POA: Diagnosis present

## 2021-10-07 DIAGNOSIS — C7889 Secondary malignant neoplasm of other digestive organs: Secondary | ICD-10-CM | POA: Diagnosis present

## 2021-10-07 DIAGNOSIS — N170 Acute kidney failure with tubular necrosis: Secondary | ICD-10-CM | POA: Diagnosis present

## 2021-10-07 DIAGNOSIS — I4821 Permanent atrial fibrillation: Secondary | ICD-10-CM | POA: Diagnosis present

## 2021-10-07 DIAGNOSIS — N183 Chronic kidney disease, stage 3 unspecified: Secondary | ICD-10-CM | POA: Diagnosis present

## 2021-10-07 DIAGNOSIS — D65 Disseminated intravascular coagulation [defibrination syndrome]: Secondary | ICD-10-CM | POA: Diagnosis present

## 2021-10-07 DIAGNOSIS — D849 Immunodeficiency, unspecified: Secondary | ICD-10-CM | POA: Diagnosis present

## 2021-10-07 DIAGNOSIS — D631 Anemia in chronic kidney disease: Secondary | ICD-10-CM | POA: Diagnosis present

## 2021-10-07 DIAGNOSIS — I82B12 Acute embolism and thrombosis of left subclavian vein: Secondary | ICD-10-CM | POA: Diagnosis present

## 2021-10-07 DIAGNOSIS — Z66 Do not resuscitate: Secondary | ICD-10-CM | POA: Diagnosis present

## 2021-10-07 DIAGNOSIS — E43 Unspecified severe protein-calorie malnutrition: Secondary | ICD-10-CM | POA: Diagnosis present

## 2021-10-07 DIAGNOSIS — I472 Ventricular tachycardia, unspecified: Secondary | ICD-10-CM | POA: Diagnosis present

## 2021-10-07 DIAGNOSIS — C787 Secondary malignant neoplasm of liver and intrahepatic bile duct: Secondary | ICD-10-CM | POA: Diagnosis present

## 2021-10-07 DIAGNOSIS — Z6827 Body mass index (BMI) 27.0-27.9, adult: Secondary | ICD-10-CM

## 2021-10-07 DIAGNOSIS — R34 Anuria and oliguria: Secondary | ICD-10-CM | POA: Diagnosis present

## 2021-10-07 DIAGNOSIS — I2510 Atherosclerotic heart disease of native coronary artery without angina pectoris: Secondary | ICD-10-CM | POA: Diagnosis present

## 2021-10-07 DIAGNOSIS — E222 Syndrome of inappropriate secretion of antidiuretic hormone: Secondary | ICD-10-CM | POA: Diagnosis present

## 2021-10-07 DIAGNOSIS — Z888 Allergy status to other drugs, medicaments and biological substances status: Secondary | ICD-10-CM

## 2021-10-07 DIAGNOSIS — Z794 Long term (current) use of insulin: Secondary | ICD-10-CM

## 2021-10-07 DIAGNOSIS — R6521 Severe sepsis with septic shock: Secondary | ICD-10-CM | POA: Diagnosis present

## 2021-10-07 DIAGNOSIS — Z95810 Presence of automatic (implantable) cardiac defibrillator: Secondary | ICD-10-CM

## 2021-10-07 DIAGNOSIS — C3491 Malignant neoplasm of unspecified part of right bronchus or lung: Secondary | ICD-10-CM | POA: Diagnosis present

## 2021-10-07 DIAGNOSIS — Z833 Family history of diabetes mellitus: Secondary | ICD-10-CM

## 2021-10-07 DIAGNOSIS — R54 Age-related physical debility: Secondary | ICD-10-CM | POA: Diagnosis present

## 2021-10-07 DIAGNOSIS — Z923 Personal history of irradiation: Secondary | ICD-10-CM

## 2021-10-07 DIAGNOSIS — J9601 Acute respiratory failure with hypoxia: Secondary | ICD-10-CM | POA: Diagnosis present

## 2021-10-07 DIAGNOSIS — Z8249 Family history of ischemic heart disease and other diseases of the circulatory system: Secondary | ICD-10-CM

## 2021-10-07 DIAGNOSIS — J9600 Acute respiratory failure, unspecified whether with hypoxia or hypercapnia: Secondary | ICD-10-CM

## 2021-10-07 DIAGNOSIS — R570 Cardiogenic shock: Secondary | ICD-10-CM | POA: Diagnosis present

## 2021-10-07 DIAGNOSIS — E785 Hyperlipidemia, unspecified: Secondary | ICD-10-CM | POA: Diagnosis present

## 2021-10-07 DIAGNOSIS — E1122 Type 2 diabetes mellitus with diabetic chronic kidney disease: Secondary | ICD-10-CM | POA: Diagnosis present

## 2021-10-07 DIAGNOSIS — Z79899 Other long term (current) drug therapy: Secondary | ICD-10-CM

## 2021-10-07 DIAGNOSIS — Z9221 Personal history of antineoplastic chemotherapy: Secondary | ICD-10-CM

## 2021-10-07 DIAGNOSIS — Z7901 Long term (current) use of anticoagulants: Secondary | ICD-10-CM

## 2021-10-07 DIAGNOSIS — I428 Other cardiomyopathies: Secondary | ICD-10-CM | POA: Diagnosis present

## 2021-10-07 DIAGNOSIS — I5023 Acute on chronic systolic (congestive) heart failure: Secondary | ICD-10-CM | POA: Diagnosis present

## 2021-10-07 DIAGNOSIS — J9621 Acute and chronic respiratory failure with hypoxia: Secondary | ICD-10-CM | POA: Diagnosis present

## 2021-10-07 DIAGNOSIS — R579 Shock, unspecified: Secondary | ICD-10-CM

## 2021-10-07 DIAGNOSIS — D649 Anemia, unspecified: Secondary | ICD-10-CM

## 2021-10-07 DIAGNOSIS — Z881 Allergy status to other antibiotic agents status: Secondary | ICD-10-CM

## 2021-10-07 LAB — COOXEMETRY PANEL
Carboxyhemoglobin: 1.1 % (ref 0.5–1.5)
Methemoglobin: <0.7 % (ref 0.0–1.5)
O2 Saturation: 37.4 %
Total hemoglobin: 8.7 g/dL — ABNORMAL LOW (ref 12.0–16.0)

## 2021-10-07 LAB — BLOOD GAS, VENOUS
Acid-base deficit: 8.9 mmol/L — ABNORMAL HIGH (ref 0.0–2.0)
Bicarbonate: 16.8 mmol/L — ABNORMAL LOW (ref 20.0–28.0)
O2 Saturation: 41.6 %
Patient temperature: 37
pCO2, Ven: 35 mmHg — ABNORMAL LOW (ref 44–60)
pH, Ven: 7.29 (ref 7.25–7.43)
pO2, Ven: 35 mmHg (ref 32–45)

## 2021-10-07 LAB — BASIC METABOLIC PANEL
Anion gap: 15 (ref 5–15)
BUN: 98 mg/dL — ABNORMAL HIGH (ref 8–23)
CO2: 20 mmol/L — ABNORMAL LOW (ref 22–32)
Calcium: 7.9 mg/dL — ABNORMAL LOW (ref 8.9–10.3)
Chloride: 93 mmol/L — ABNORMAL LOW (ref 98–111)
Creatinine, Ser: 3.69 mg/dL — ABNORMAL HIGH (ref 0.44–1.00)
GFR, Estimated: 11 mL/min — ABNORMAL LOW (ref >60)
Glucose, Bld: 143 mg/dL — ABNORMAL HIGH (ref 70–99)
Potassium: 4.9 mmol/L (ref 3.5–5.1)
Sodium: 128 mmol/L — ABNORMAL LOW (ref 135–145)

## 2021-10-07 LAB — VITAMIN B1: Vitamin B1 (Thiamine): 157.2 nmol/L (ref 66.5–200.0)

## 2021-10-07 LAB — VITAMIN B6: Vitamin B6: 2.6 ug/L — ABNORMAL LOW (ref 3.4–65.2)

## 2021-10-07 MED ORDER — PANTOPRAZOLE 2 MG/ML SUSPENSION
40.0000 mg | Freq: Every day | ORAL | Status: DC
  Administered 2021-10-07: 40 mg
  Filled 2021-10-07: qty 20

## 2021-10-07 MED ORDER — MORPHINE SULFATE (PF) 2 MG/ML IV SOLN: 2.0000 mg | INTRAVENOUS | Status: DC | PRN

## 2021-10-07 MED ORDER — NOREPINEPHRINE 4 MG/250ML-% IV SOLN: 0.0000 ug/min | INTRAVENOUS | Status: DC

## 2021-10-07 MED ORDER — ACETAMINOPHEN 325 MG PO TABS: 650.0000 mg | ORAL_TABLET | ORAL | Status: DC | PRN

## 2021-10-07 MED ORDER — ONDANSETRON 4 MG PO TBDP: 4.0000 mg | ORAL_TABLET | Freq: Four times a day (QID) | ORAL | Status: DC | PRN

## 2021-10-07 MED ORDER — ONDANSETRON HCL 4 MG/2ML IJ SOLN: 4.0000 mg | Freq: Four times a day (QID) | INTRAMUSCULAR | Status: DC | PRN

## 2021-10-07 MED ORDER — ACETAMINOPHEN 650 MG RE SUPP: 650.0000 mg | Freq: Four times a day (QID) | RECTAL | Status: DC | PRN

## 2021-10-07 MED ORDER — POLYVINYL ALCOHOL 1.4 % OP SOLN
1.0000 [drp] | Freq: Four times a day (QID) | OPHTHALMIC | Status: DC | PRN
  Filled 2021-10-07: qty 15

## 2021-10-07 MED ORDER — INSULIN ASPART 100 UNIT/ML IJ SOLN: 0.0000 [IU] | INTRAMUSCULAR | Status: DC

## 2021-10-07 MED ORDER — MORPHINE BOLUS VIA INFUSION
5.0000 mg | INTRAVENOUS | Status: DC | PRN
  Filled 2021-10-07: qty 5

## 2021-10-07 MED ORDER — ROSUVASTATIN CALCIUM 20 MG PO TABS: 10.0000 mg | ORAL_TABLET | Freq: Every day | ORAL | Status: DC

## 2021-10-07 MED ORDER — VANCOMYCIN HCL 1500 MG/300ML IV SOLN
1500.0000 mg | Freq: Once | INTRAVENOUS | Status: DC
Start: 2021-10-07 — End: 2021-10-07
  Filled 2021-10-07: qty 300

## 2021-10-07 MED ORDER — DEXTROSE 5 % IV SOLN: INTRAVENOUS | Status: DC

## 2021-10-07 MED ORDER — DOCUSATE SODIUM 50 MG/5ML PO LIQD: 100.0000 mg | Freq: Two times a day (BID) | ORAL | Status: DC | PRN

## 2021-10-07 MED ORDER — MORPHINE SULFATE (PF) 2 MG/ML IV SOLN
INTRAVENOUS | Status: AC
  Administered 2021-10-07: 0.5 mg via INTRAVENOUS
  Filled 2021-10-07: qty 1

## 2021-10-07 MED ORDER — CEFEPIME HCL 2 G IJ SOLR
2.0000 g | INTRAMUSCULAR | Status: DC
Start: 2021-10-07 — End: 2021-10-07
  Administered 2021-10-07: 2 g via INTRAVENOUS
  Filled 2021-10-07: qty 12.5

## 2021-10-07 MED ORDER — MORPHINE SULFATE (PF) 2 MG/ML IV SOLN
0.5000 mg | INTRAVENOUS | Status: DC | PRN
Start: 2021-10-07 — End: 2021-10-07

## 2021-10-07 MED ORDER — GLYCOPYRROLATE 0.2 MG/ML IJ SOLN: 0.2000 mg | INTRAMUSCULAR | Status: DC | PRN

## 2021-10-07 MED ORDER — IPRATROPIUM-ALBUTEROL 0.5-2.5 (3) MG/3ML IN SOLN
3.0000 mL | RESPIRATORY_TRACT | Status: DC | PRN
Start: 2021-10-07 — End: 2021-10-07

## 2021-10-07 MED ORDER — DIPHENHYDRAMINE HCL 50 MG/ML IJ SOLN: 25.0000 mg | INTRAMUSCULAR | Status: DC | PRN

## 2021-10-07 MED ORDER — SODIUM CHLORIDE 0.9% IV SOLUTION: Freq: Once | INTRAVENOUS | Status: DC

## 2021-10-07 MED ORDER — MIDAZOLAM HCL 2 MG/2ML IJ SOLN: 2.0000 mg | INTRAMUSCULAR | Status: DC | PRN

## 2021-10-07 MED ORDER — MORPHINE 100MG IN NS 100ML (1MG/ML) PREMIX INFUSION
0.0000 mg/h | INTRAVENOUS | Status: DC
  Administered 2021-10-07: 1 mg/h via INTRAVENOUS
  Filled 2021-10-07: qty 100

## 2021-10-07 MED ORDER — VANCOMYCIN VARIABLE DOSE PER UNSTABLE RENAL FUNCTION (PHARMACIST DOSING)
Status: DC
Start: 2021-10-08 — End: 2021-10-07

## 2021-10-07 MED ORDER — ACETAMINOPHEN 325 MG PO TABS: 650.0000 mg | ORAL_TABLET | Freq: Four times a day (QID) | ORAL | Status: DC | PRN

## 2021-10-07 MED ORDER — POLYETHYLENE GLYCOL 3350 17 G PO PACK: 17.0000 g | PACK | Freq: Every day | ORAL | Status: DC | PRN

## 2021-10-07 MED ORDER — GLYCOPYRROLATE 1 MG PO TABS: 1.0000 mg | ORAL_TABLET | ORAL | Status: DC | PRN

## 2021-10-08 LAB — BPAM PLATELET PHERESIS
Blood Product Expiration Date: 202304222359
ISSUE DATE / TIME: 202304201656
Unit Type and Rh: 6200

## 2021-10-08 LAB — PREPARE PLATELET PHERESIS: Unit division: 0

## 2021-10-10 LAB — CULTURE, BLOOD (ROUTINE X 2): Culture: NO GROWTH

## 2021-10-18 NOTE — Progress Notes (Deleted)
Pharmacy Antibiotic Note ? ?April Park is a 88 y.o. female admitted on 19-Oct-2021 with bacteremia.  Pharmacy has been consulted for ceftriaxone dosing. ? ?88 yo with multiple medical issues who was transferred from [facility]. She had a 1 blood cx that grew out strep mitis/oralis. She does have an ICD with multiple leads. Couldn't do TEE per ID there. She has been getting clinda there. Ceftriaxone ordered after transfer. Could not confirm ceftriaxone sensitivity with micro tonight. ? ? ?Plan: ?Ceftriaxone 2g IV q24 ?F/u sens in AM ? ?  ? ?Temp (24hrs), Avg:97.5 ?F (36.4 ?C), Min:97.5 ?F (36.4 ?C), Max:97.5 ?F (36.4 ?C) ? ?Recent Labs  ?Lab 10/02/21 ?1437 10/03/21 ?4827 10/04/21 ?0420 10/05/21 ?0786 10/05/21 ?1500 10/06/21 ?0620 10-19-2021 ?0630  ?WBC 10.5 12.0* 13.9* 10.9*  --  13.5*  --   ?CREATININE  --  2.14* 2.12* 2.92*  --  3.30* 3.69*  ?[place]  --   --   --   --  8*  --   --   ?  ?CrCl cannot be calculated (Unknown ideal weight.).   ? ?Allergies  ?Allergen Reactions  ? Ace Inhibitors Cough  ? Amiodarone Nausea Only and Nausea And Vomiting  ?  Has tried med on 2 different occasions with same reaction   ? Levaquin [Levofloxacin] Nausea And Vomiting  ? ? ?Antimicrobials this admission: ?4/20 ceftriaxone>> ? ? ?Dose adjustments this admission: ? ? ?Microbiology results: ?4/12 blood>>1/2 bottles strep mitis/oralis ?4/18 blood>>ngtd ? ?Onnie Boer, PharmD, BCIDP, AAHIVP, CPP ?Infectious Disease Pharmacist ?2021-10-19 5:18 PM ? ? ? ? ?

## 2021-10-18 NOTE — Plan of Care (Signed)
?  Interdisciplinary Goals of Care Family Meeting ? ? ?Date carried out:: 10/15/21 ? ?Location of the meeting: Conference room ? ?Member's involved: Physician, Bedside Registered Nurse, and Family Member or next of kin ? ?Durable Power of Tour manager: Husband Willean Schurman   ? ?Discussion: We discussed goals of care for [facility] .  With Dr. Jeffie Pollock leading the conversation we discussed the fact that Linet is suffering from multi-organ failure in the setting of septic shock and metastatic lung cancer.  Her husband Rogue Jury voiced understanding and agreed with our suggested plan to move to full comfort measures.   ? ?Code status: Full DNR ? ?Disposition: In-patient comfort care ? ? ?Time spent for the meeting: 20 minutes ? ?Roselie Awkward ?15-Oct-2021, 6:31 PM ? ?

## 2021-10-18 NOTE — Progress Notes (Signed)
Time of death: 63 confirmed by Meda Klinefelter RN and Ronalee Belts RN. Honor bridge and patient placement called. Family at bedside.  ?

## 2021-10-18 NOTE — H&P (Signed)
? ?NAME:  April Park, MRN:  [id number], DOB:  January 03, 1932, LOS: 0 ?ADMISSION DATE:  4/20 CONSULTATION DATE:  4/20 ?REFERRING MD:  [facility]; MD? , CHIEF COMPLAINT: Cardiogenic shock/respiratory failure with hypoxia ? ?History of Present Illness:  ?Patient is a 88 year old female with pertinent PMH of A-fib, CAD, chronic systolic CHF with NICM s/p AV nodal ablation/PPM followed by AICD (most recent echo LVEF 25%), IDDM, CKD III, HLD, HTN, metastatic adenocarcinoma of lung with metastasis to the abdomen on Keytruda and radiation [facility]. ? ?Patient recently hospitalized at [place] on 08/29/2021-09/19/2021 with SOB.  Found to have non-small cell lung cancer with metastasis to the abdomen.  Patient was started on radiation therapy and Keytruda.  Patient also found to have a right pleural effusion.  Thoracentesis performed 09/08/2021 with 500 cc output.  Patient was treated with 2 weeks of Augmentin.  Patient also with chronic systolic HF and A-fib on BB, digoxin, Eliquis.  After patient was discharged on 09/19/2021 she continued to have SOB and required BiPAP.  [facility]  Patient noted to have AKI.  CT chest showing innumerable pulmonary nodules; RML/RLL collapse with moderate pleural effusion on right. ? ?On 4/12 cardiology consulted for worsening heart failure  and continued respiratory distress. BC 1/2 showing strep mitis ID started on clindamycin and rocephin. Patient started on milrinone which was later changed to dopamine for inotropic support without much improvement. Patient developed NSVT while on dopamine. On lasix gtt. Eliquis being held with severe thrombocytopenia. 4/14 underwent R thoracentesis for R pleural effusion but US showed no pleural fluid so was canceled. 4/15 echo showing LVEF 50-55%. 4/17 US showing Left subclavian thrombosis. On 4/20 patient being transferred to [facility] from [facility]  for further work-up.  PCCM consulted for ICU admission. ? ?Pertinent  Medical History  ? ?Past Medical History:  ?Diagnosis Date  ? A-fib ([place])   ? failed amio due to Gi side effects (in [place])   ? CAD (coronary artery disease)   ? non-obstructive CAD on cath January 2012. mLAD 40-50%  ? Chest pain   ? CHF (congestive heart failure) ([place])   ? due to recurrent NICM echo 06/2010: EF 20-25% mod-severe MR, moderate TR  ? Diabetes mellitus   ? GI bleed   ? Hyperlipidemia   ? Hypertension   ? S/P atrioventricular nodal ablation   ? ? ? ?Significant Hospital Events: ?Including procedures, antibiotic start and stop dates in addition to other pertinent events   ?4/20: admitted to [facility] from [place] for cardiogenic shock on dopamine switched to levo; resp failure on bipap ? ?Interim History / Subjective:  ?Patient in respiratory distress not able to speak complete sentences ?On salter HFNC ?BP 75/55 ?HR 110s  ? ?Objective   ?There were no vitals taken for this visit. ?   ?   ?No intake or output data in the 24 hours ending 10/26/21 1642 ?There were no vitals filed for this visit. ? ?Examination: ?General:  critically ill appearing on slater HFNC ?HEENT: MM pink/moist; salter [place] and cortrak in place ?Neuro: not following commands; unsure if language barrier; MAE; PERRL ?CV: s1s2, tachy 110s, no m/r/g ?PULM:  dim BS on L and very diminished on R; HFNC 14 L ?GI: soft, bsx4 active ?Extremities: warm/dry, BLE 2+ edema; R picc line in place without erythema ?GU: foley in place w/ minimal UOP ?Skin: no rashes or  lesions appreciated ? ? ?Resolved Hospital Problem list   ? ? ?Assessment & Plan:  ? ?Cardiogenic shock: possibly combo of septic with BC 1/2 showing strep mitis on 4/14 (rocephin/clinda started 4/14) ?Chronic systolic CHF with NICM s/p AV nodal ablation/PPM followed by AICD: LVEF 25% ?Non sustained VT ?P: ?-will admit to ICU ?-Heart failure following; appreciate recs ?-started on Levo for MAP goal >65 ?-inotropic  support and lasix per HF ?-will place A-line post platelet transfusion ?-trend lactate ?-check CVP/Coox ?-check ABG ?-check BNP ?-check Mag, ionized calcium: replete electrolytes as needed ?-check cultures: bcx2, urine culture/UA ?-was on rocephin/clindamycin since 4/14; will broaden abx to cefepime/vanc ?-check procalcitonin ?-CXR ? ?Acute on chronic respiratory failure with hypoxia: on bipap qhs ?R lung opacification vs. pleural effusion: likely malignant related; 4/17 US showing minimal pleural fluid so thoracentesis canceled ?P: ?-check abg ?-check CXR ?-continue bipap qhs and prn ?-salter HFNC for sat >92%; consider heated HFNC ?-diuresis per cards ?-prn duoneb for wheezing ? ?Non-small cell lung carcinoma with metastasis to abdomen: recently started on Keytruda and radiation therapy ?P: ?-Further follow-up on outpatient ? ?Chronic a-fib on eliquis but currently on hold due to severe thrombocytopenia ?CAD ?HTN ?P: ?-Continuous telemetry monitoring ?-Holding antihypertensives in setting of hypotension ?-holding eliquis in setting of severe thrombocytopenia ?-cont statin ? ?Oliguric AKI on CKD III ?P: ?-Trend BMP / urinary output ?-Replace electrolytes as indicated ?-Avoid nephrotoxic agents, ensure adequate renal perfusion ? ?IDDM ?P: ?-ssi and cbg monitoring ?-check a1c ? ?Hyponatremia ?Hx of SIADH ?P: ?-trend bmp ?-fluid restrict ?-consider further work up ? ?Left subclavian vein thrombosis: confirmed with [place] 4/17 ?P: ?-no AC with severe thrombocytopenia ? ?Severe thrombocytopenia ?Anemia ?P: ?-check type and screen; transfuse 1 unit of platelets ?-Trend CBC ?-continue to hold eliquis ? ?Best Practice (right click and "Reselect all SmartList Selections" daily)  ? ?Diet/type: NPO w/ oral meds ?DVT prophylaxis: SCD ?GI prophylaxis: PPI ?Lines: N/A ?Foley:  N/A ?Code Status:  limited; no compressions/intubation; okay with acls medications/defibrillation; will speak with patient and family to confirm ?Last date of  multidisciplinary goals of care discussion [pending] ? ?Labs   ?CBC: ?Recent Labs  ?Lab 10/02/21 ?1437 10/03/21 ?1093 10/04/21 ?0420 10/05/21 ?2355 10/06/21 ?7322  ?WBC 10.5 12.0* 13.9* 10.9* 13.5*  ?NEUTROABS  --   --   --   --  12.0*  ?HGB 8.6* 8.8* 9.5* 9.1* 9.2*  ?HCT 27.4* 27.1* 29.1* 28.8* 27.8*  ?MCV 107.0* 105.4* 104.7* 106.7* 105.3*  ?PLT 67*  62* 38* 23* 15* 10*  ? ? ? ?Basic Metabolic Panel: ?Recent Labs  ?Lab 10/03/21 ?0254 10/04/21 ?0420 10/05/21 ?2706 10/06/21 ?2376 Oct 28, 2021 ?0630  ?NA 135 135 131* 130* 128*  ?K 3.9 3.7 3.9 4.5 4.9  ?CL 96* 98 93* 94* 93*  ?CO2 26 27 24 23  20*  ?GLUCOSE 351* 240* 326* 249* 143*  ?BUN 71* 74* 82* 92* 98*  ?CREATININE 2.14* 2.12* 2.92* 3.30* 3.69*  ?CALCIUM 8.5* 8.6* 8.2* 8.2* 7.9*  ?MG 2.0  --   --  2.1  --   ? ? ?GFR: ?CrCl cannot be calculated (Unknown ideal weight.). ?Recent Labs  ?Lab 10/03/21 ?2831 10/04/21 ?0420 10/05/21 ?5176 10/06/21 ?1607  ?WBC 12.0* 13.9* 10.9* 13.5*  ? ? ? ?Liver Function Tests: ?Recent Labs  ?Lab 10/01/21 ?0448 10/02/21 ?0335  ?AST 76* 68*  ?ALT 69* 57*  ?ALKPHOS 210* 186*  ?BILITOT 2.8* 2.7*  ?PROT 5.7* 5.6*  ?ALBUMIN 3.1* 3.6  ? ? ?No results for input(s): LIPASE, AMYLASE  in the last 168 hours. ?Recent Labs  ?Lab 10/02/21 ?0335  ?AMMONIA 45*  ? ? ? ?ABG ?   ?Component Value Date/Time  ? PHART 7.409 (H) 07/02/2010 1219  ? PCO2ART 37.5 07/02/2010 1219  ? PO2ART 63.0 (L) 07/02/2010 1219  ? HCO3 27.0 (H) 07/02/2010 1240  ? TCO2 28 07/02/2010 1240  ? ACIDBASEDEF 4.0 (H) 07/02/2010 1235  ? O2SAT 59.0 07/02/2010 1240  ? ?  ? ?Coagulation Profile: ?Recent Labs  ?Lab 10/02/21 ?1437  ?INR 1.3*  ? ? ? ?Cardiac Enzymes: ?No results for input(s): CKTOTAL, CKMB, CKMBINDEX, TROPONINI in the last 168 hours. ? ?HbA1C: ?Hemoglobin A1C  ?Date/Time Value Ref Range Status  ?03/08/2021 12:00 AM 6.8  Final  ?08/14/2019 12:00 AM 7.7  Final  ? ? ?CBG: ?No results for input(s): GLUCAP in the last 168 hours. ? ?Review of Systems:   ?Patient is encephalopathic  and/or intubated. Therefore history has been obtained from chart review.  ? ? ?Past Medical History:  ?She,  has a past medical history of A-fib ([place]), CAD (coronary artery disease), Chest pain, CHF (congestive he

## 2021-10-18 NOTE — Progress Notes (Addendum)
Pharmacy Antibiotic Note ? ?April Park is a 88 y.o. female with multiple medical issues transferred from [facility] 11-01-21 with sepsis. She had 1 blood cx that grew out strep mitis/oralis. She does have an ICD with multiple leads. Couldn't do TEE per ID at [facility] and had been receiving clindamycin. Ceftriaxone ordered after transfer. Could not confirm ceftriaxone sensitivity with micro tonight. Pharmacy has been consulted to transition to vancomycin/cefepime dosing (no doses of ceftriaxone received). Noted AKI - SCr 3.69 on admit (has ranged 2.12-3.3 since 4/12). ? ?4/12 blood>>strep mitis/oralis sens clinda/vanc, intermediate to PCN, resistant to tetracycline ? ?Plan: ?Cefepime 2g IV q24h ?Vancomycin 1500mg  IV x 1; then f/u renal function trend and may consider checking level prior to re-dosing. Goal AUC 400-550. ?Monitor clinical progress, c/s, renal function ?F/u de-escalation plan/LOT, vancomycin levels as indicated ? ? ?  ? ?No data recorded. ? ?Recent Labs  ?Lab 10/02/21 ?1437 10/03/21 ?8333 10/04/21 ?0420 10/05/21 ?8329 10/05/21 ?1500 10/06/21 ?0620 2021/11/01 ?0630  ?WBC 10.5 12.0* 13.9* 10.9*  --  13.5*  --   ?CREATININE  --  2.14* 2.12* 2.92*  --  3.30* 3.69*  ?[place]  --   --   --   --  8*  --   --   ?  ?CrCl cannot be calculated (Unknown ideal weight.).   ? ?Allergies  ?Allergen Reactions  ? Ace Inhibitors Cough  ? Amiodarone Nausea Only and Nausea And Vomiting  ?  Has tried med on 2 different occasions with same reaction   ? Levaquin [Levofloxacin] Nausea And Vomiting  ? ? ?Arturo Morton, PharmD, BCPS ?Please check AMION for all [place] contact numbers ?Clinical Pharmacist ?11/01/2021 5:10 PM ? ? ?

## 2021-10-18 NOTE — Consult Note (Signed)
?  ?Advanced Heart Failure Team Consult Note ? ? ?Primary Physician: Jodi Marble, MD ?PCP-Cardiologist:  None ? ?Reason for Consultation: Shock ? ?HPI:   ? ?April Park is seen today for evaluation of cardiogenic shock at the request of Dr. Lake Bells. 88 y.o. female with history of CAD nonobstructive by cath 5277, chronic systolic CHF/NICM s/p AV nodal ablation/PPM followed by AICD, Afib, insulin dependent DM, HLD, HLD, CKD III, SIADH. Recently diagnosed with NSCLC with mets to abdomen on chemotherapy/immunotherapy/radiation. ? ?She was hospitalized at [facility] 03/21-04/03/23 with acute on chronic respiratory failure initially requiring BiPAP continuously (chronically using at night). Had right pleural effusion (felt to be malignant) and underwent thoracentesis on 03/22. Cardiology consulted for afib with RVR. BP soft, coreg switched to metoprolol xl and digoxin added. Entresto stopped d/t soft BP and hyperkalemia. Eliquis held d/t thrombocytopenia (Platelet 39-70).  ? ?After discharge she continued to have worsening shortness of breath and was admitted to [facility].  CT C/A/P with stable innumerable pulmonary nodules, complete collapse right middle and right lower lobe with partial collapse right upper lobe, small to moderate right pleural effusions. One set [facility] drawn 04/12 grew Strep mitis treating with clindamcyin and ceftriaxone per ID. Cardiology consulted 04/12 for a/c CHF. Digoxin stopped d/t toxicity, level elevated at 3.4. She was started on milrinone and later switched to dopamine. Runs of NSVT noted on dopamine. Despite inotrope support and Lasix gtt, notes indicate she did not have much urine output. Underwent R thoracentesis on 04/14 for recurrent pleural effusion.Coarse has been further c/b AKI, severe thrombocytopenia and left subclavian vein thrombosis. D/t lack of improvement she was transferred to [facility] today for further management.  ? ?Review of Systems:  unavailable due to  shock ? ?Home Medications ?Prior to Admission medications   ?Medication Sig Start Date End Date Taking? Authorizing Provider  ?albuterol (PROVENTIL) (2.5 MG/3ML) 0.083% nebulizer solution Take 3 mLs (2.5 mg total) by nebulization every 6 (six) hours as needed for wheezing or shortness of breath. 07/08/21   Allyne Gee, MD  ?amiodarone (PACERONE) 200 MG tablet Take 1 tablet by mouth daily. 03/08/21   [provider]  ?calcium-vitamin D 250-100 MG-UNIT tablet Take 1 tablet by mouth daily.    [provider]  ?carvedilol (COREG) 12.5 MG tablet Take 12.5 mg by mouth 2 (two) times daily. 04/25/21   [provider]  ?dapagliflozin propanediol (FARXIGA) 5 MG TABS tablet Take 5 mg by mouth daily. 09/16/19   Elayne Snare, MD  ?furosemide (LASIX) 20 MG tablet Take 20 mg by mouth as needed.    [provider]  ?glimepiride (AMARYL) 2 MG tablet Take 1 tablet (2 mg total) by mouth See admin instructions. Take 2mg  in the morning and 1mg  in the evening ?Patient taking differently: Take 1 mg by mouth See admin instructions. Take 1mg  in the morning and 1mg  in the evening 09/16/19   Elayne Snare, MD  ?glucose blood test strip Use OneTouch Verio test strips as instructed to check blood sugar twice daily. 03/18/21   Elayne Snare, MD  ?ipratropium-albuterol (DUONEB) 0.5-2.5 (3) MG/3ML SOLN Take 3 mLs by nebulization every 6 (six) hours as needed. 07/08/21 08/07/21  Allyne Gee, MD  ?Magnesium 500 MG CAPS Take 1 capsule by mouth daily.    [provider]  ?metFORMIN (GLUCOPHAGE) 500 MG tablet Take 500 mg by mouth 2 (two) times daily. 01/06/21   [provider]  ?Jonetta Speak Lancets 82U MISC 1 each by Does  not apply route See admin instructions. Use OneTouch Delica lancets to check blood sugar 2-3 times daily. 03/18/21   Elayne Snare, MD  ?pantoprazole (PROTONIX) 40 MG tablet Take 40 mg by mouth 2 (two) times daily. 02/26/21   [provider]  ?polyethylene glycol (MIRALAX /  GLYCOLAX) 17 g packet Take 17 g by mouth daily.    [provider]  ?rosuvastatin (CRESTOR) 10 MG tablet Take 10 mg by mouth daily.    [provider]  ?sacubitril-valsartan (ENTRESTO) 49-51 MG Take 1 tablet by mouth 2 (two) times daily.    [provider]  ?sitaGLIPtin (JANUVIA) 100 MG tablet Take 1 tablet (100 mg total) by mouth daily. ?Patient taking differently: Take 50 mg by mouth daily. 09/16/19   Elayne Snare, MD  ?SLOW FE 142 (45 Fe) MG TBCR Take 1 tablet by mouth daily. 04/21/21   [provider]  ?spironolactone (ALDACTONE) 25 MG tablet Take 25 mg by mouth daily. 04/12/21   [provider]  ?XARELTO 15 MG TABS tablet Take 15 mg by mouth daily. 01/06/21   [provider]  ? ? ?Past Medical History: ?Past Medical History:  ?Diagnosis Date  ? A-fib ([place])   ? failed amio due to Gi side effects (in [place])   ? CAD (coronary artery disease)   ? non-obstructive CAD on cath January 2012. mLAD 40-50%  ? Chest pain   ? CHF (congestive heart failure) ([place])   ? due to recurrent NICM echo 06/2010: EF 20-25% mod-severe MR, moderate TR  ? Diabetes mellitus   ? GI bleed   ? Hyperlipidemia   ? Hypertension   ? S/P atrioventricular nodal ablation   ? ? ?Past Surgical History: ?Past Surgical History:  ?Procedure Laterality Date  ? CARDIAC CATHETERIZATION    ? COLONOSCOPY WITH PROPOFOL N/A 11/07/2016  ? Procedure: COLONOSCOPY WITH PROPOFOL;  Surgeon: [name], MD;  Location: [facility] ENDOSCOPY;  Service: Endoscopy;  Laterality: N/A;  ? ESOPHAGOGASTRODUODENOSCOPY (EGD) WITH PROPOFOL N/A 10/20/2016  ? Procedure: ESOPHAGOGASTRODUODENOSCOPY (EGD) WITH PROPOFOL;  Surgeon: [name], MD;  Location: [facility] ENDOSCOPY;  Service: Endoscopy;  Laterality: N/A;  ? GIVENS CAPSULE STUDY N/A 11/07/2016  ? Procedure: GIVENS CAPSULE STUDY;  Surgeon: [name], MD;  Location: [facility] ENDOSCOPY;  Service: Endoscopy;  Laterality: N/A;  ? KNEE ARTHROSCOPY    ? 2009  ? ? ?Family History: ?Family History   ?Problem Relation Age of Onset  ? Arrhythmia Mother 49  ? Heart failure Mother   ?     diastolic dysfunction  ? Diabetes Mother   ? Diabetes Sister   ?     family history  ? Hypertension Other   ?     family history  ? Diabetes Brother   ? ? ?Social History: ?Social History  ? ?Socioeconomic History  ? Marital status: Married  ?  Spouse name: Not on file  ? Number of children: Not on file  ? Years of education: Not on file  ? Highest education level: Not on file  ?Occupational History  ? Not on file  ?Tobacco Use  ? Smoking status: Never  ? Smokeless tobacco: Never  ?Substance and Sexual Activity  ? Alcohol use: No  ? Drug use: No  ? Sexual activity: Not on file  ?Other Topics Concern  ? Not on file  ?Social History Narrative  ? Retired   ? Married   ? Tobacco Use - No.   ? Alcohol Use - no  ? Regular Exercise -  no  ? Drug Use - no  ? Smoking Status:  never  ? Does Patient Exercise:  no  ? Drug Use:  no  ? ?Social Determinants of Health  ? ?Financial Resource Strain: Not on file  ?Food Insecurity: Not on file  ?Transportation Needs: Not on file  ?Physical Activity: Not on file  ?Stress: Not on file  ?Social Connections: Not on file  ? ? ?Allergies:  ?Allergies  ?Allergen Reactions  ? Ace Inhibitors Cough  ? Amiodarone Nausea Only and Nausea And Vomiting  ?  Has tried med on 2 different occasions with same reaction   ? Levaquin [Levofloxacin] Nausea And Vomiting  ? ? ?Objective:   ? ?Vital Signs:   ?BP: ()/()  ?Arterial Line BP: ()/()  ?  ? ?Weight change: ?There were no vitals filed for this visit. ? ?Intake/Output:  ?No intake or output data in the 24 hours ending 20-Oct-2021 1631  ? ? ?Physical Exam  ?  ?See MD exam below ? ?Labs  ? ?Basic Metabolic Panel: ?Recent Labs  ?Lab 10/03/21 ?3335 10/04/21 ?0420 10/05/21 ?4562 10/06/21 ?5638 Oct 20, 2021 ?0630  ?NA 135 135 131* 130* 128*  ?K 3.9 3.7 3.9 4.5 4.9  ?CL 96* 98 93* 94* 93*  ?CO2 26 27 24 23  20*  ?GLUCOSE 351* 240* 326* 249* 143*  ?BUN 71* 74* 82* 92* 98*   ?CREATININE 2.14* 2.12* 2.92* 3.30* 3.69*  ?CALCIUM 8.5* 8.6* 8.2* 8.2* 7.9*  ?MG 2.0  --   --  2.1  --   ? ? ?Liver Function Tests: ?Recent Labs  ?Lab 10/01/21 ?0448 10/02/21 ?0335  ?AST 76* 68*  ?ALT 69* 57*  ?ALKPHOS 210* 18

## 2021-10-18 NOTE — Death Summary Note (Signed)
?DEATH SUMMARY  ? ?Patient Details  ?Name: April Park ?MRN: [id number] ?DOB: 1932/01/13 ? ?Admission/Discharge Information  ? ?Admit Date:  10/29/21  ?Date of Death: Date of Death: 10-29-2021  ?Time of Death: Time of Death: 1930  ?Length of Stay: 1  ?Referring Physician: Jodi Marble, MD  ? ?Reason(s) for Hospitalization  ?Acute hypoxemic respiratory failure ? ?Diagnoses  ?Preliminary cause of death:  ?Septic shock ?Secondary Diagnoses (including complications and co-morbidities):  ?Principal Problem: ?  Cardiogenic shock ([place]) ?Active Problems: ?  Acute respiratory failure ([place]) ?  Shock ([place]) ?  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 ?Brief Hospital Course (including significant findings, care, treatment, and services provided and events leading to death)  ?April Park is a 88 y.o. year old female who 88 y/o female with a complex medical history moved from [facility] to [facility] on 10-30-22 in the setting of worsening AKI, shock and underlying stage 4 cancer.  The patient has a history of atrial fibrillation and systolic heart failure and was diagnosed with stage 4 adenocarcrinoma of the lung in 2023. She has been treated with XRT to the chest and gallbladder and received Keytruda.  She was admitted to [facility] on 3/21 in the setting of chronic heart afilure, CKD and hypoxemic respiratory failure.  She was treatd with lasix, thoracentesis for a right pleural effusion and her atrial fibrillaiton was treated with metoprolol.  She was weak and in need of physical rehab so she was sent to [facility] for further evaluation. After arrival there she was noted to be bacteremic with strep mitis.  ID has seen her and treated her with ceftriaxone and clindamycin.  Mention of infection of her ICD lead and PICC was made, but because of her frail state and thrombocytopenia her  lines were left in place.  PCCM was consulted for admission to [facility] on 2022-10-30 due to worsening AKI, hypoxemic respiratory failure and atrial fibrillation.  ?She was admitted to the ICU and the advanced heart failure team was consulted.  Shortly after admission the patient's respiratory status worsened, her mental status worsened.  We discussed the patient's overall poor prognosis in the setting of cardiogenic and septic shock with severe multi-organ failure in the face of baseline stage 4 lung cancer, severe protein calorie malnutrition, and her advanced age. The patient's family elected to proceed with full comfort measures. ? ? ?Pertinent Labs and Studies  ?Significant Diagnostic Studies ?DG Abd 1 View ? ?Result Date: 10/04/2021 ?CLINICAL DATA:  Enteric tube placement EXAM: ABDOMEN - 1 VIEW COMPARISON:  None. FINDINGS: Tip of enteric tube is seen in the region of distal antrum of the stomach. Contrast injected through the enteric tube is noted in the lumen of the stomach and proximal duodenum. There is opacification of right hemithorax. IMPRESSION: Tip of enteric tube is seen in the distal antrum of the stomach. Electronically Signed   By: Elmer Picker M.D.   On: 10/04/2021 13:54  ? ?DG Chest Port 1 View ? ?Result Date: Oct 29, 2021 ?CLINICAL DATA:  Respiratory failure. EXAM: PORTABLE CHEST 1 VIEW COMPARISON:  Chest x-ray 10/04/2021. FINDINGS: There is complete opacification of the right hemithorax, unchanged. Cardiomediastinal silhouette is stable. Left-sided pacemaker is again seen. Enteric tube extends below the diaphragm. Right upper extremity PICC terminates over the SVC. Left lung is grossly clear. Osseous structures are stable. IMPRESSION: 1. Unchanged complete opacification of the right hemithorax  corresponding to pleural effusion and right lung collapse/mass. Electronically Signed   By: Ronney Asters M.D.   On: 2021-10-09 17:21  ? ?DG CHEST PORT 1 VIEW ? ?Result Date: 10/04/2021 ?CLINICAL DATA:  Pleural  effusion.  Evaluate infiltrate. EXAM: PORTABLE CHEST 1 VIEW COMPARISON:  10/03/2021 FINDINGS: There is a left chest wall ICD with leads in the right atrial appendage and right ventricle. Right arm PICC line tip terminates in the right atrium. Unchanged complete opacification of the right hemithorax reflecting underlying pleural effusion and large central right lung mass. No new changes since the previous exam. IMPRESSION: 1. No change complete opacification of the right lung compared with previous exam reflecting underlying pleural effusion and large right lung mass. Electronically Signed   By: Kerby Moors M.D.   On: 10/04/2021 06:12  ? ?DG CHEST PORT 1 VIEW ? ?Result Date: 10/03/2021 ?CLINICAL DATA:  Short of breath.  Evaluate right pleural effusion. EXAM: PORTABLE CHEST 1 VIEW COMPARISON:  10/01/2021 FINDINGS: Right upper extremity PICC line is noted with tip terminating in the right atrium. There is a left chest wall pacer device. Leads are in the projection of the right atrium and right ventricle. Stable cardiomediastinal contours. No change in complete opacification of the right hemithorax secondary to pleural effusion and mass. Left lung scratch set mild atelectasis is identified in the left base. IMPRESSION: Unchanged in complete opacification of the right hemithorax secondary to pleural effusion and mass. Electronically Signed   By: Kerby Moors M.D.   On: 10/03/2021 11:37  ? ?DG CHEST PORT 1 VIEW ? ?Result Date: 10/01/2021 ?CLINICAL DATA:  88 year old female with history of respiratory difficulty. EXAM: PORTABLE CHEST 1 VIEW COMPARISON:  Chest x-ray 09/29/2021. FINDINGS: There is a right upper extremity PICC with tip terminating in the right atrium. Complete opacification in the right hemithorax. Left lung appears relatively clear, although there is some interstitial prominence in the left mid to lower lung. No left pleural effusion. No pneumothorax. No evidence of pulmonary edema. Heart size appears  normal. Atherosclerotic calcifications in the thoracic aorta. Left-sided pacemaker/AICD with lead tips projecting over the expected location of the right atrium and right ventricle. IMPRESSION: 1. Worsening aeration in the right hemithorax, which based upon recent chest CT comparison is related to a large right pleural effusion, with extensive areas of atelectasis and/or consolidation throughout the right lung which have worsened in the right upper lobe in the interval between this examination and the most recent prior study. 2. Aortic atherosclerosis. 3. Support apparatus, as above. Electronically Signed   By: Vinnie Langton M.D.   On: 10/01/2021 05:42  ? ?DG CHEST PORT 1 VIEW ? ?Result Date: 09/29/2021 ?CLINICAL DATA:  Respiratory failure. EXAM: PORTABLE CHEST 1 VIEW COMPARISON:  September 20, 2021. FINDINGS: Stable large right pleural effusion is noted with small amount of aerated right upper lobe. Left-sided defibrillator is unchanged in position. Left lung is clear. Stable cardiomediastinal silhouette. Right-sided PICC line is unchanged. Bony thorax is unremarkable. IMPRESSION: Stable large right pleural effusion is noted with associated atelectasis or infiltrate. Electronically Signed   By: Marijo Conception M.D.   On: 09/29/2021 17:19  ? ?ECHOCARDIOGRAM COMPLETE ? ?Result Date: 10/02/2021 ?   ECHOCARDIOGRAM REPORT   Patient Name:   AVARI NEVARES Date of Exam: 10/02/2021 Medical Rec #:  [id number]   Height:       61.0 in Accession #:    [id number]  Weight:       146.0 lb Date of Birth:  [birth year]    BSA:          1.652 m? Patient Age:    49 years    BP:           99/58 mmHg Patient Gender: F           HR:           91 bpm. Exam Location:  Inpatient Procedure: 2D Echo, Cardiac Doppler and Color Doppler Indications:     Endocarditis I38  History:         Patient has prior history of Echocardiogram examinations, most                  recent 06/17/2011.  Sonographer:     Merrie Roof RDCS Referring Phys:  [id number] [place] Diagnosing Phys: Dixie Dials MD IMPRESSIONS  1. Left ventricular ejection fraction, by estimation, is 50 to 55%. The left ventricle has low normal function. The left ventricle demonstrates regional

## 2021-10-18 DEATH — deceased

## 2022-11-25 IMAGING — US US BIOPSY CORE LIVER
1 series · 13 of 23 positions shown · non-contrast
Comparison: none

INDICATION: Lung masses, hepatic lesions, gallbladder mass, abdominal
lymphadenopathy and peritoneal mass. Malignancy has not been
demonstrated by bronchoscopic sampling in the right lung. Request
has been made to perform liver lesion biopsy.

[Series 1: us biopsy (liver) · 13 of 23 slices shown]
[im 1/23]
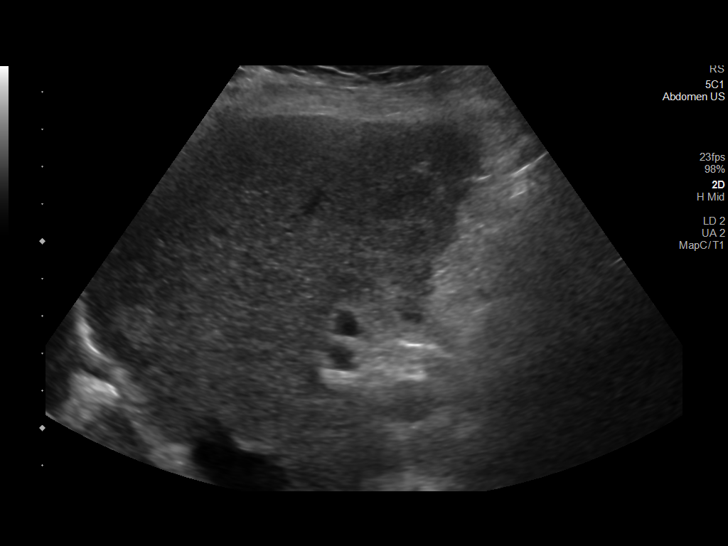
[im 3/23]
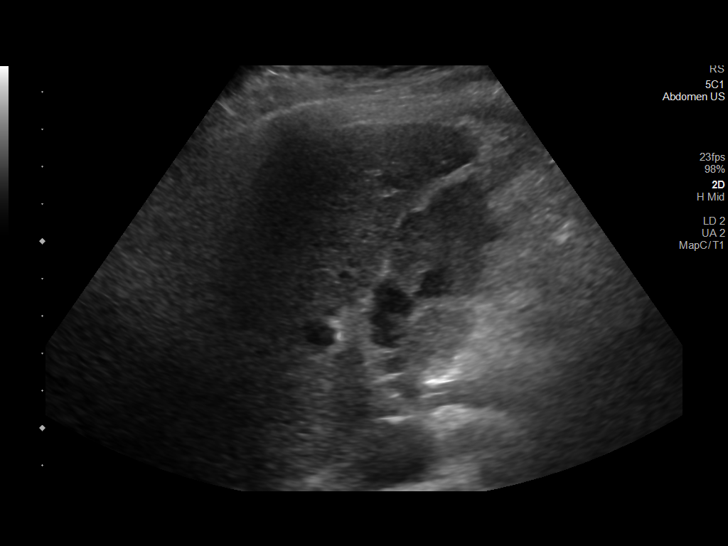
[im 5/23]
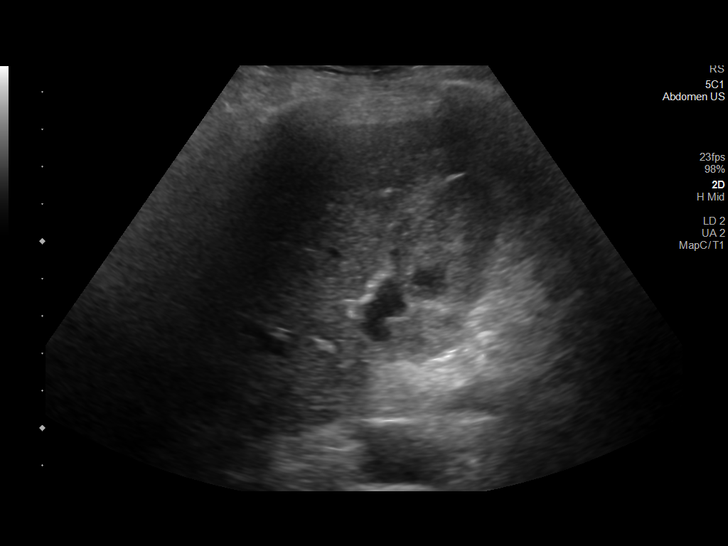
[im 7/23]
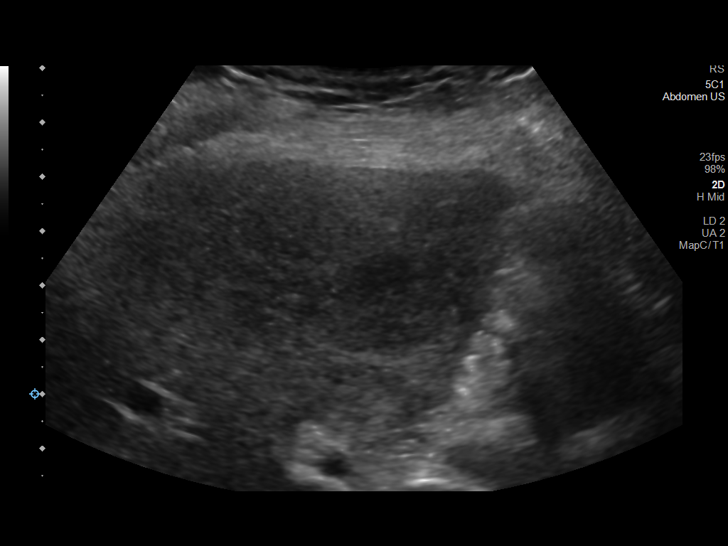
[im 8/23]
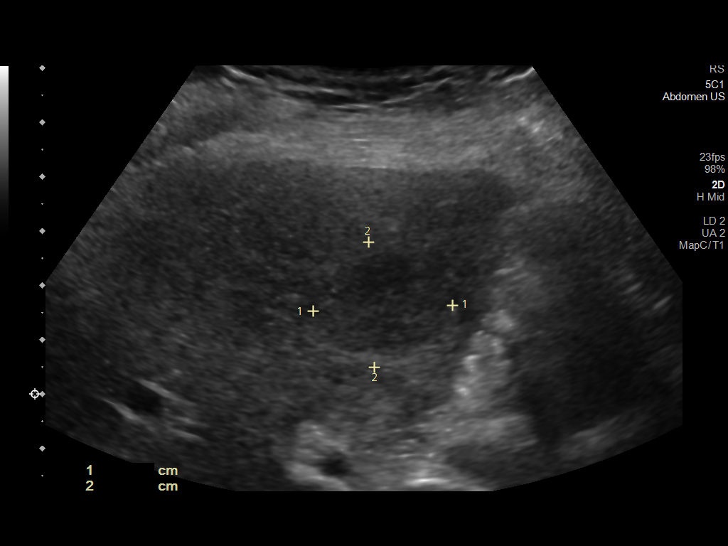
[im 10/23]
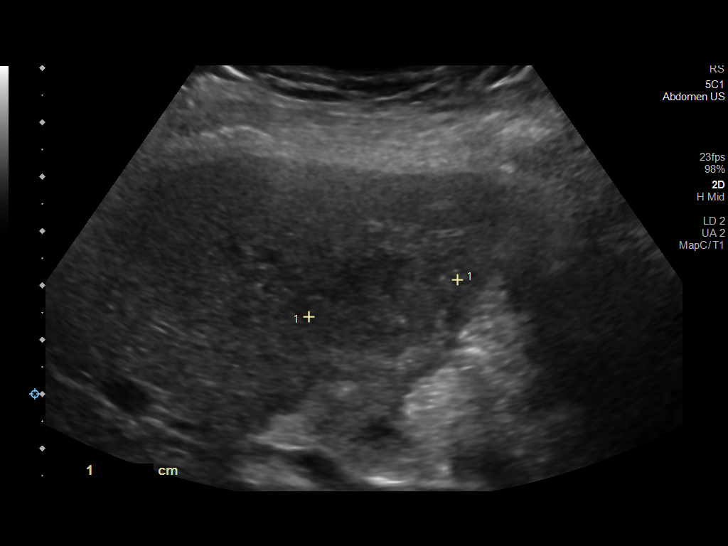
[im 12/23]
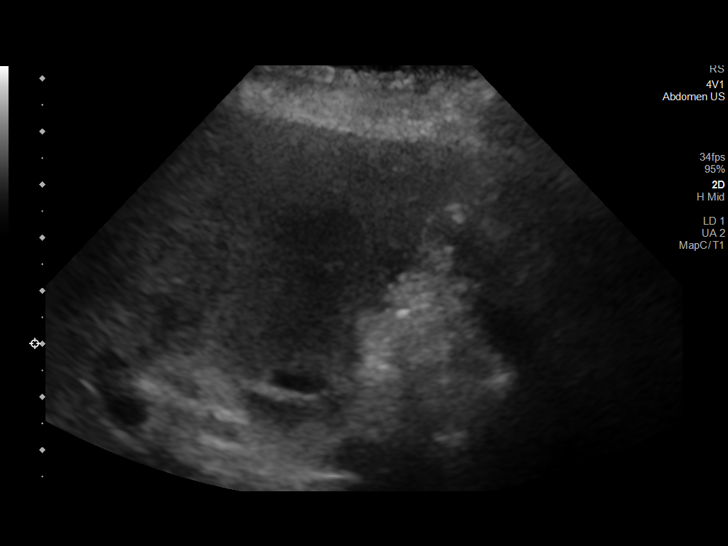
[im 14/23]
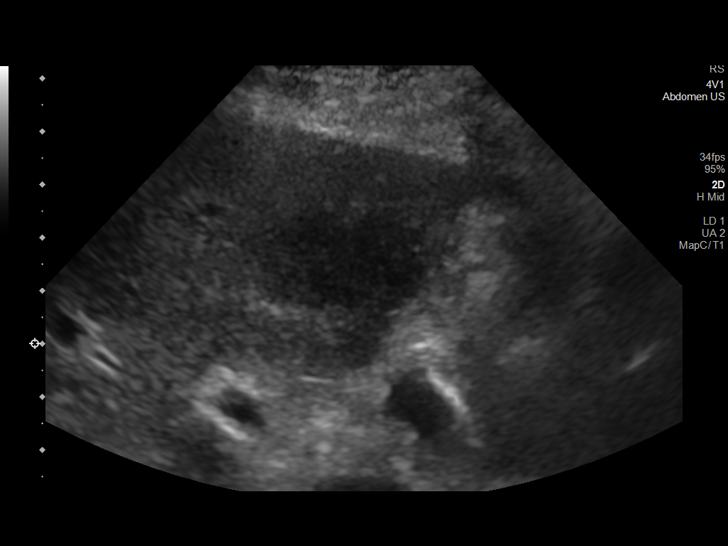
[im 16/23]
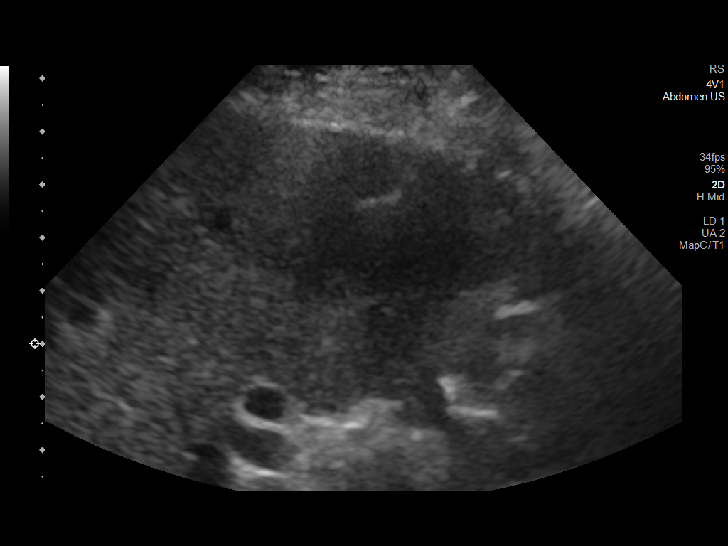
[im 17/23]
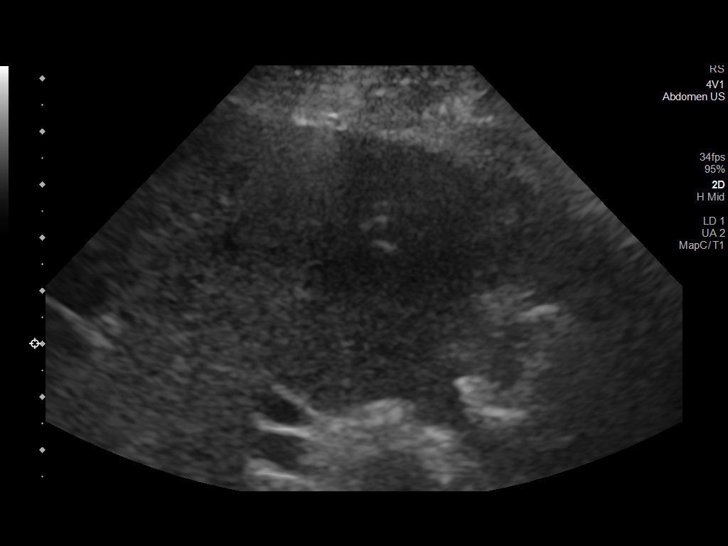
[im 19/23]
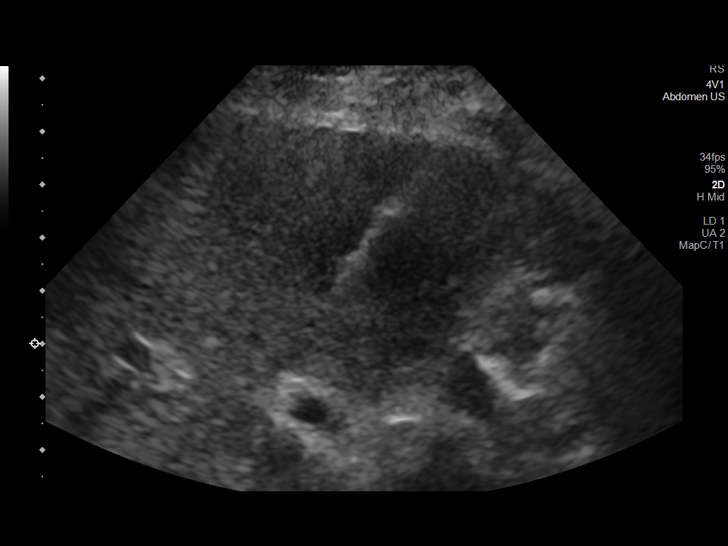
[im 21/23]
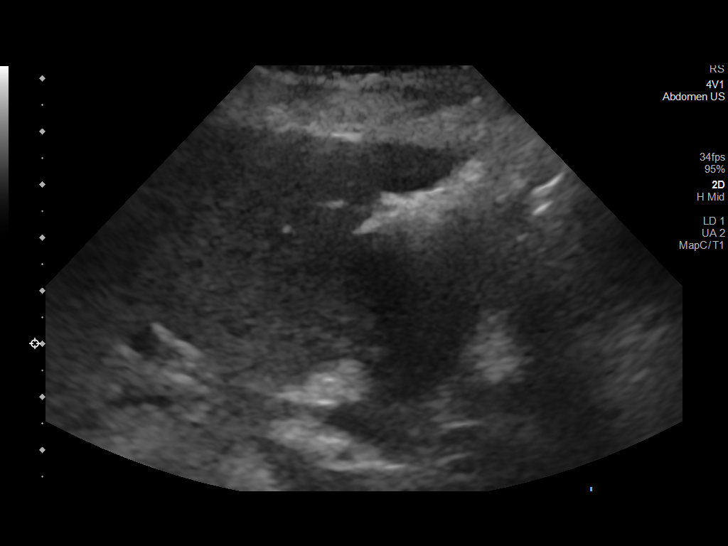
[im 23/23]
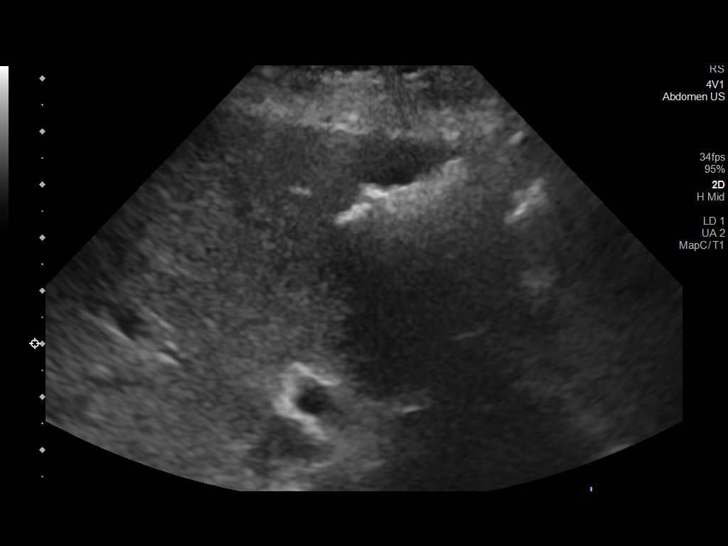

[13 of 23 positions shown; findings below may reference images not displayed]

EXAM:
ULTRASOUND GUIDED CORE BIOPSY OF RIGHT LOBE HEPATIC MASS

MEDICATIONS:
None.

ANESTHESIA/SEDATION:
Moderate (conscious) sedation was employed during this procedure. A
total of Versed 1.0 mg and Fentanyl 25 mcg was administered
intravenously.

Moderate Sedation Time: 15 minutes. The patient's level of
consciousness and vital signs were monitored continuously by
radiology nursing throughout the procedure under my direct
supervision.

PROCEDURE:
The procedure, risks, benefits, and alternatives were explained to
the patient. Questions regarding the procedure were encouraged and
answered. The patient understands and consents to the procedure. A
time-out was performed prior to initiating the procedure.

Ultrasound was performed the liver and region of the gallbladder.
The abdominal wall was prepped with chlorhexidine in a sterile
fashion, and a sterile drape was applied covering the operative
field. A sterile gown and sterile gloves were used for the
procedure. Local anesthesia was provided with 1% Lidocaine.

After localizing a lesion within the right lobe of the liver for
biopsy, a 17 gauge trocar needle was advanced into the liver. After
confirming needle tip position, 3 separate coaxial 18 gauge core
biopsy samples were obtained. Material was submitted in formalin.
Gel-Foam pledgets were advanced through the outer needle as the
needle was retracted and removed. Additional ultrasound was
performed.

COMPLICATIONS:
None immediate.
FINDINGS: The only discrete liver lesion identified by ultrasound is a
hypoechoic lesion in the right lobe superior to the gallbladder
fossa and measuring approximately 2.8 x 2.3 x 2.6 cm. Also noted by
ultrasound is a very abnormal appearance to the gallbladder which
appears to be involved by tumor. Solid tissue was obtained from the
hepatic lesion.
IMPRESSION: Ultrasound-guided core biopsy performed of a hypoechoic lesion in
the right lobe of the liver measuring up to approximately 2.8 cm in
greatest diameter. Also noted by ultrasound is a very abnormal
appearance to the gallbladder which appears to be involved by tumor.
